# Patient Record
Sex: Male | Born: 1980
Health system: Southern US, Community
[De-identification: ages and names within clinical notes are randomized; demographics above are authoritative.]

## PROBLEM LIST (undated history)

## (undated) DIAGNOSIS — M1990 Unspecified osteoarthritis, unspecified site: Secondary | ICD-10-CM

## (undated) DIAGNOSIS — R079 Chest pain, unspecified: Secondary | ICD-10-CM

## (undated) DIAGNOSIS — E7800 Pure hypercholesterolemia, unspecified: Secondary | ICD-10-CM

## (undated) DIAGNOSIS — F419 Anxiety disorder, unspecified: Secondary | ICD-10-CM

## (undated) DIAGNOSIS — K219 Gastro-esophageal reflux disease without esophagitis: Secondary | ICD-10-CM

## (undated) HISTORY — PX: LEG SURGERY: SHX1003

## (undated) HISTORY — PX: VASECTOMY: SHX75

## (undated) HISTORY — PX: VASECTOMY REVERSAL: SHX243

---

## 2000-02-03 ENCOUNTER — Emergency Department (HOSPITAL_COMMUNITY): Admission: EM | Admit: 2000-02-03 | Discharge: 2000-02-03 | Payer: Self-pay | Admitting: Emergency Medicine

## 2004-02-08 ENCOUNTER — Emergency Department (HOSPITAL_COMMUNITY): Admission: EM | Admit: 2004-02-08 | Discharge: 2004-02-08 | Payer: Self-pay | Admitting: Emergency Medicine

## 2008-09-23 ENCOUNTER — Ambulatory Visit: Payer: Self-pay | Admitting: Gastroenterology

## 2008-09-23 DIAGNOSIS — R079 Chest pain, unspecified: Secondary | ICD-10-CM | POA: Insufficient documentation

## 2008-09-23 DIAGNOSIS — K5900 Constipation, unspecified: Secondary | ICD-10-CM | POA: Insufficient documentation

## 2008-10-03 ENCOUNTER — Ambulatory Visit: Payer: Self-pay | Admitting: Gastroenterology

## 2008-11-04 ENCOUNTER — Telehealth: Payer: Self-pay | Admitting: Gastroenterology

## 2010-05-02 ENCOUNTER — Emergency Department (HOSPITAL_COMMUNITY): Admission: EM | Admit: 2010-05-02 | Discharge: 2010-05-02 | Payer: Self-pay | Admitting: Emergency Medicine

## 2013-02-05 ENCOUNTER — Other Ambulatory Visit: Payer: Self-pay

## 2013-02-05 MED ORDER — ATORVASTATIN CALCIUM 20 MG PO TABS: 20.0000 mg | ORAL_TABLET | Freq: Every day | ORAL | Status: DC

## 2013-02-05 MED ORDER — FENOFIBRATE 160 MG PO TABS: 160.0000 mg | ORAL_TABLET | Freq: Every day | ORAL | Status: DC

## 2013-04-29 ENCOUNTER — Emergency Department (HOSPITAL_BASED_OUTPATIENT_CLINIC_OR_DEPARTMENT_OTHER)
Admission: EM | Admit: 2013-04-29 | Discharge: 2013-04-29 | Disposition: A | Discharge status: Home / Self Care | Payer: 59 | Attending: Emergency Medicine | Admitting: Emergency Medicine

## 2013-04-29 ENCOUNTER — Emergency Department (HOSPITAL_BASED_OUTPATIENT_CLINIC_OR_DEPARTMENT_OTHER): Payer: 59

## 2013-04-29 ENCOUNTER — Encounter (HOSPITAL_BASED_OUTPATIENT_CLINIC_OR_DEPARTMENT_OTHER): Payer: Self-pay | Admitting: *Deleted

## 2013-04-29 DIAGNOSIS — E7800 Pure hypercholesterolemia, unspecified: Secondary | ICD-10-CM | POA: Insufficient documentation

## 2013-04-29 DIAGNOSIS — Z79899 Other long term (current) drug therapy: Secondary | ICD-10-CM | POA: Insufficient documentation

## 2013-04-29 DIAGNOSIS — W07XXXA Fall from chair, initial encounter: Secondary | ICD-10-CM | POA: Insufficient documentation

## 2013-04-29 DIAGNOSIS — S52121A Displaced fracture of head of right radius, initial encounter for closed fracture: Secondary | ICD-10-CM

## 2013-04-29 DIAGNOSIS — F411 Generalized anxiety disorder: Secondary | ICD-10-CM | POA: Insufficient documentation

## 2013-04-29 DIAGNOSIS — S52123A Displaced fracture of head of unspecified radius, initial encounter for closed fracture: Secondary | ICD-10-CM | POA: Insufficient documentation

## 2013-04-29 DIAGNOSIS — Y9389 Activity, other specified: Secondary | ICD-10-CM | POA: Insufficient documentation

## 2013-04-29 DIAGNOSIS — F329 Major depressive disorder, single episode, unspecified: Secondary | ICD-10-CM | POA: Insufficient documentation

## 2013-04-29 DIAGNOSIS — F3289 Other specified depressive episodes: Secondary | ICD-10-CM | POA: Insufficient documentation

## 2013-04-29 DIAGNOSIS — F17200 Nicotine dependence, unspecified, uncomplicated: Secondary | ICD-10-CM | POA: Insufficient documentation

## 2013-04-29 DIAGNOSIS — Y929 Unspecified place or not applicable: Secondary | ICD-10-CM | POA: Insufficient documentation

## 2013-04-29 HISTORY — DX: Anxiety disorder, unspecified: F41.9

## 2013-04-29 HISTORY — DX: Pure hypercholesterolemia, unspecified: E78.00

## 2013-04-29 MED ORDER — HYDROCODONE-ACETAMINOPHEN 5-325 MG PO TABS: 2.0000 | ORAL_TABLET | ORAL | Status: DC | PRN

## 2013-04-29 MED ORDER — HYDROCODONE-ACETAMINOPHEN 5-325 MG PO TABS
2.0000 | ORAL_TABLET | Freq: Once | ORAL | Status: AC
  Administered 2013-04-29: 2 via ORAL
  Filled 2013-04-29: qty 2

## 2013-04-29 NOTE — ED Notes (Signed)
MD at bedside. 

## 2013-04-29 NOTE — ED Provider Notes (Signed)
History    CSN: [id number] Arrival date & time 04/29/13  1806  First MD Initiated Contact with Patient 04/29/13 1856     Chief Complaint  Patient presents with  . Elbow Injury   (Consider location/radiation/quality/duration/timing/severity/associated sxs/prior Treatment) HPI Comments: Patient was working on a smoke detector and fell from a chair, injuring his right elbow.  No other injury.  Occurred one hour ago.  Worse with movement, range of motion.  Better with rest.    Patient is a 32 y.o. male presenting with arm injury. The history is provided by the patient.  Arm Injury Location:  Elbow Time since incident:  1 hour Injury: yes   Mechanism of injury: fall   Fall:    Fall occurred:  From a stool   Impact surface:  Hard floor   Point of impact:  Outstretched arms Elbow location:  R elbow Pain details:    Quality:  Sharp   Radiates to:  Does not radiate   Severity:  Moderate   Onset quality:  Sudden   Timing:  Constant   Progression:  Unchanged Chronicity:  New  Past Medical History  Diagnosis Date  . Hypercholesteremia   . Anxiety   . Depression    History reviewed. No pertinent past surgical history. History reviewed. No pertinent family history. History  Substance Use Topics  . Smoking status: Current Every Day Smoker  . Smokeless tobacco: Not on file  . Alcohol Use: Yes    Review of Systems  All other systems reviewed and are negative.    Allergies  Review of patient's allergies indicates no known allergies.  Home Medications   Current Outpatient Rx  Name  Route  Sig  Dispense  Refill  . ALPRAZolam (XANAX) 0.5 MG tablet   Oral   Take 0.5 mg by mouth at bedtime as needed for sleep.         Marland Kitchen esomeprazole (NEXIUM) 20 MG packet   Oral   Take 20 mg by mouth daily before breakfast.         . sertraline (ZOLOFT) 50 MG tablet   Oral   Take 50 mg by mouth daily.         Marland Kitchen atorvastatin (LIPITOR) 20 MG tablet   Oral   Take 1 tablet (20 mg  total) by mouth daily.   30 tablet   0   . fenofibrate 160 MG tablet   Oral   Take 1 tablet (160 mg total) by mouth daily.   30 tablet   0    BP 133/73  Pulse 74  Temp(Src) 98.6 F (37 C) (Oral)  Resp 20  Ht 5\' 10"  (1.778 m)  Wt 190 lb (86.183 kg)  BMI 27.26 kg/m2  SpO2 100% Physical Exam  Nursing note and vitals reviewed. Constitutional: He is oriented to person, place, and time. He appears well-developed and well-nourished. No distress.  HENT:  Head: Normocephalic and atraumatic.  Neck: Normal range of motion. Neck supple.  Musculoskeletal:  The right elbow is noted to have ttp over the lateral aspect, but otherwise appears grossly normal.  There is pain with range of motion.  The distal pulses, motor, and sensation are intact as well.  Neurological: He is alert and oriented to person, place, and time.  Skin: Skin is warm and dry. He is not diaphoretic.    ED Course  Procedures (including critical care time) Labs Reviewed - No data to display No results found. No diagnosis found.  MDM  Xrays show a radial head fracture.  Will treat with a long arm splint, sling, follow up with orthopedics.    Geoffery Lyons, MD 04/29/13 [phone]

## 2013-04-29 NOTE — ED Notes (Signed)
Pt states he fell out of a chair and landed on his right elbow. PMS intact. Placed in sling for comfort and ice applied.

## 2013-05-01 ENCOUNTER — Other Ambulatory Visit: Payer: Self-pay | Admitting: Orthopedic Surgery

## 2013-05-01 DIAGNOSIS — S52121A Displaced fracture of head of right radius, initial encounter for closed fracture: Secondary | ICD-10-CM

## 2013-05-03 ENCOUNTER — Ambulatory Visit
Admission: RE | Admit: 2013-05-03 | Discharge: 2013-05-03 | Disposition: A | Discharge status: Home / Self Care | Payer: 59 | Source: Ambulatory Visit | Attending: Orthopedic Surgery | Admitting: Orthopedic Surgery

## 2013-05-03 DIAGNOSIS — S52121A Displaced fracture of head of right radius, initial encounter for closed fracture: Secondary | ICD-10-CM

## 2013-05-09 HISTORY — PX: ELBOW FRACTURE SURGERY: SHX616

## 2013-08-08 ENCOUNTER — Ambulatory Visit (INDEPENDENT_AMBULATORY_CARE_PROVIDER_SITE_OTHER): Payer: 59 | Admitting: Physician Assistant

## 2013-08-08 ENCOUNTER — Encounter: Payer: Self-pay | Admitting: Physician Assistant

## 2013-08-08 VITALS — BP 118/80 | HR 72 | Temp 98.2°F | Resp 16 | Ht 69.0 in | Wt 195.0 lb

## 2013-08-08 DIAGNOSIS — K219 Gastro-esophageal reflux disease without esophagitis: Secondary | ICD-10-CM

## 2013-08-08 DIAGNOSIS — Z0000 Encounter for general adult medical examination without abnormal findings: Secondary | ICD-10-CM

## 2013-08-08 DIAGNOSIS — E785 Hyperlipidemia, unspecified: Secondary | ICD-10-CM | POA: Insufficient documentation

## 2013-08-08 DIAGNOSIS — F329 Major depressive disorder, single episode, unspecified: Secondary | ICD-10-CM | POA: Insufficient documentation

## 2013-08-08 DIAGNOSIS — F341 Dysthymic disorder: Secondary | ICD-10-CM

## 2013-08-08 DIAGNOSIS — F17200 Nicotine dependence, unspecified, uncomplicated: Secondary | ICD-10-CM

## 2013-08-08 DIAGNOSIS — F32A Depression, unspecified: Secondary | ICD-10-CM

## 2013-08-08 DIAGNOSIS — Z559 Problems related to education and literacy, unspecified: Secondary | ICD-10-CM

## 2013-08-08 LAB — HEMOGLOBIN A1C
Hgb A1c MFr Bld: 5.4 % (ref <5.7)
Mean Plasma Glucose: 108 mg/dL (ref <117)

## 2013-08-08 LAB — CBC WITH DIFFERENTIAL/PLATELET
Basophils Absolute: 0 10*3/uL (ref 0.0–0.1)
Eosinophils Absolute: 0.3 10*3/uL (ref 0.0–0.7)
Eosinophils Relative: 5 % (ref 0–5)
HCT: 43.1 % (ref 39.0–52.0)
Lymphocytes Relative: 29 % (ref 12–46)
MCH: 29.7 pg (ref 26.0–34.0)
Monocytes Absolute: 0.5 10*3/uL (ref 0.1–1.0)
Neutrophils Relative %: 57 % (ref 43–77)
RBC: 5.11 MIL/uL (ref 4.22–5.81)
RDW: 13.4 % (ref 11.5–15.5)
WBC: 5.5 10*3/uL (ref 4.0–10.5)

## 2013-08-08 LAB — LIPID PANEL
Cholesterol: 179 mg/dL (ref 0–200)
HDL: 36 mg/dL — ABNORMAL LOW (ref >39)
Total CHOL/HDL Ratio: 5 Ratio
VLDL: 71 mg/dL — ABNORMAL HIGH (ref 0–40)

## 2013-08-08 LAB — HEPATIC FUNCTION PANEL
AST: 16 U/L (ref 0–37)
Alkaline Phosphatase: 49 U/L (ref 39–117)

## 2013-08-08 LAB — BASIC METABOLIC PANEL
BUN: 15 mg/dL (ref 6–23)
CO2: 31 mEq/L (ref 19–32)
Creat: 1.15 mg/dL (ref 0.50–1.35)
Potassium: 4.4 mEq/L (ref 3.5–5.3)
Sodium: 139 mEq/L (ref 135–145)

## 2013-08-08 LAB — TSH: TSH: 0.61 u[IU]/mL (ref 0.350–4.500)

## 2013-08-08 MED ORDER — ESOMEPRAZOLE MAGNESIUM 20 MG PO PACK: 20.0000 mg | PACK | Freq: Every day | ORAL | Status: DC

## 2013-08-08 MED ORDER — ALPRAZOLAM 0.5 MG PO TABS: 0.5000 mg | ORAL_TABLET | Freq: Every evening | ORAL | Status: DC | PRN

## 2013-08-08 MED ORDER — ATORVASTATIN CALCIUM 20 MG PO TABS: 20.0000 mg | ORAL_TABLET | Freq: Every day | ORAL | Status: DC

## 2013-08-08 MED ORDER — FENOFIBRATE 160 MG PO TABS: 160.0000 mg | ORAL_TABLET | Freq: Every day | ORAL | Status: DC

## 2013-08-08 MED ORDER — SERTRALINE HCL 100 MG PO TABS: 100.0000 mg | ORAL_TABLET | Freq: Every day | ORAL | Status: DC

## 2013-08-08 NOTE — Assessment & Plan Note (Signed)
Refill lipitor and fenofibrate.  Will obtain fasting lipid panel.

## 2013-08-08 NOTE — Progress Notes (Signed)
Patient ID: Jerry Lopez, male   DOB: 07/29/1981, 32 y.o.   MRN: [id number]  Patient presents to clinic today to establish care.  Acute Concerns: Smoking Cessation -- Patient with history of smoking.  Patient endorses quitting in the past cold [place] which worked for him.  Had quit up until a few months ago, when he lost his job.  Patient states he feels that if he can get his depression under control he will be able to quit.  Wants to discuss options for smoking cessation today.  Chronic Issues: Depression -- Chronic.  Patient endorses increase in symptoms after he lost job due to being out of work for elbow surgery too long.  Endorses anhedonia and depressed mood. Has been on Zoloft 50 mg for several years, which he states has been very effective. Denies SIs/HIs.  Has seen therapist 14 years ago.  Has noticed increase in smoking and drinking.  No history of thyroid disorder.  Hypercholesterolemia -- Patient with reported history of hypercholesterolemia.  Is currently on atorvastatin and fenofibrate.  Requesting refills.  GERD -- Patient with history of GERD requiring Nexium 20 mg daily.  Has had EGD in the past; denies abnormal findings.  Symptoms are well controlled with medication.  Endorses rare breakthrough symptoms with alcohol ingestion.  Health Maintenance: Dental -- 1 year.  Overdue. Vision -- overdue. Immunizations -- Up-to-date on immunizations.  Flu shot declined.    Past Medical History  Diagnosis Date  . Hypercholesteremia   . Anxiety   . Depression     No current outpatient prescriptions on file prior to visit.   No current facility-administered medications on file prior to visit.    No Known Allergies  Family History  Problem Relation Age of Onset  . Hypertension Father   . Non-Hodgkin's lymphoma Father   . Diabetes Father     History   Social History  . Marital Status: Married    Spouse Name: N/A    Number of Children: N/A  . Years of Education:  N/A   Social History Main Topics  . Smoking status: Current Every Day Smoker -- 1.00 packs/day for 5 years  . Smokeless tobacco: Never Used  . Alcohol Use: 3.6 oz/week    6 Cans of beer per week  . Drug Use: No  . Sexual Activity: Yes    Birth Control/ Protection: None   Other Topics Concern  . None   Social History Narrative  . None   Review of Systems  Constitutional: Negative for fever, chills and weight loss.  HENT: Negative for ear pain, hearing loss and tinnitus.   Eyes: Negative for blurred vision, double vision, photophobia and pain.  Respiratory: Negative for cough, shortness of breath and wheezing.   Cardiovascular: Negative for chest pain and palpitations.  Gastrointestinal: Positive for heartburn. Negative for nausea, vomiting, abdominal pain, diarrhea, constipation, blood in stool and melena.  Genitourinary: Negative for dysuria, urgency, frequency, hematuria and flank pain.  Musculoskeletal: Positive for joint pain. Negative for myalgias.  Neurological: Negative for dizziness, seizures, loss of consciousness and headaches.  Endo/Heme/Allergies: Positive for environmental allergies.  Psychiatric/Behavioral: Positive for depression. Negative for suicidal ideas, hallucinations and substance abuse. The patient is nervous/anxious and has insomnia.    Filed Vitals:   08/08/13 1035  BP: 118/80  Pulse: 72  Temp: 98.2 F (36.8 C)  Resp: 16   Physical Exam  Vitals reviewed. Constitutional: He is oriented to person, place, and time and well-developed, well-nourished, and in no  distress.  HENT:  Head: Normocephalic and atraumatic.  Right Ear: External ear normal.  Left Ear: External ear normal.  Nose: Nose normal.  Mouth/Throat: Oropharynx is clear and moist. No oropharyngeal exudate.  TM WNL bilaterally  Eyes: Conjunctivae and EOM are normal. Pupils are equal, round, and reactive to light.  Neck: Neck supple. No thyromegaly present.  Cardiovascular: Normal rate,  regular rhythm, normal heart sounds and intact distal pulses.   Pulmonary/Chest: Effort normal and breath sounds normal. No respiratory distress. He has no wheezes.  Abdominal: Soft. Bowel sounds are normal. He exhibits no distension and no mass. There is no tenderness. There is no rebound and no guarding.  Lymphadenopathy:    He has no cervical adenopathy.  Neurological: He is alert and oriented to person, place, and time. He has normal reflexes. No cranial nerve deficit.  Skin: Skin is warm and dry. No rash noted.  Psychiatric: Affect normal.   Assessment/Plan: GERD (gastroesophageal reflux disease) Well-controlled.  Refill nexium 20 mg QD.  Encourage alcohol cessation.  Materials and suggestion given to patient for smoking cessation.  Encounter for preventive health examination Will obtain fasting labs.  Declines flu shot.  Anxiety and depression Increase Zoloft to 75 mg QD x 7 days, then increase to 100 mg QD.  Refill Xanax 0.5mg  prn at bedtime to help with sleep/anxiety.  Follow-up in 1 month.  Other and unspecified hyperlipidemia Refill lipitor and fenofibrate.  Will obtain fasting lipid panel.  Needs smoking cessation education Materials given.  Patient wishes to start with Nicorette gum.  Will see patient in 1 month for follow-up of anxiety/depression.  Will revisit smoking cessation efforts at that time.

## 2013-08-08 NOTE — Patient Instructions (Signed)
Please take 1 1/2 (50mg ) pills of zoloft for the next 5-7 days.  That's a total of 75 mg per day.  Then take 1 100 mg tablet a day thereafter.  Xanax at bedtime to help with sleep.  Follow-up in 1 month.  Please take all other medications as prescribed.  Please read information below on smoking cessation tips.  Please obtain labs.  I will call you with your results.  Smoking Cessation, Tips for Success YOU CAN QUIT SMOKING If you are ready to quit smoking, congratulations! You have chosen to help yourself be healthier. Cigarettes bring nicotine, tar, carbon monoxide, and other irritants into your body. Your lungs, heart, and blood vessels will be able to work better without these poisons. There are many different ways to quit smoking. Nicotine gum, nicotine patches, a nicotine inhaler, or nicotine nasal spray can help with physical craving. Hypnosis, support groups, and medicines help break the habit of smoking. Here are some tips to help you quit for good.  Throw away all cigarettes.  Clean and remove all ashtrays from your home, work, and car.  On a card, write down your reasons for quitting. Carry the card with you and read it when you get the urge to smoke.  Cleanse your body of nicotine. Drink enough water and fluids to keep your urine clear or pale yellow. Do this after quitting to flush the nicotine from your body.  Learn to predict your moods. Do not let a bad situation be your excuse to have a cigarette. Some situations in your life might tempt you into wanting a cigarette.  Never have "just one" cigarette. It leads to wanting another and another. Remind yourself of your decision to quit.  Change habits associated with smoking. If you smoked while driving or when feeling stressed, try other activities to replace smoking. Stand up when drinking your coffee. Brush your teeth after eating. Sit in a different chair when you read the paper. Avoid alcohol while trying to quit, and try to drink  fewer caffeinated beverages. Alcohol and caffeine may urge you to smoke.  Avoid foods and drinks that can trigger a desire to smoke, such as sugary or spicy foods and alcohol.  Ask people who smoke not to smoke around you.  Have something planned to do right after eating or having a cup of coffee. Take a walk or exercise to perk you up. This will help to keep you from overeating.  Try a relaxation exercise to calm you down and decrease your stress. Remember, you may be tense and nervous for the first 2 weeks after you quit, but this will pass.  Find new activities to keep your hands busy. Play with a pen, coin, or rubber band. Doodle or draw things on paper.  Brush your teeth right after eating. This will help cut down on the craving for the taste of tobacco after meals. You can try mouthwash, too.  Use oral substitutes, such as lemon drops, carrots, a cinnamon stick, or chewing gum, in place of cigarettes. Keep them handy so they are available when you have the urge to smoke.  When you have the urge to smoke, try deep breathing.  Designate your home as a nonsmoking area.  If you are a heavy smoker, ask your caregiver about a prescription for nicotine chewing gum. It can ease your withdrawal from nicotine.  Reward yourself. Set aside the cigarette money you save and buy yourself something nice.  Look for support from others.  Join a support group or smoking cessation program. Ask someone at home or at work to help you with your plan to quit smoking.  Always ask yourself, "Do I need this cigarette or is this just a reflex?" Tell yourself, "Today, I choose not to smoke," or "I do not want to smoke." You are reminding yourself of your decision to quit, even if you do smoke a cigarette. HOW WILL I FEEL WHEN I QUIT SMOKING?  The benefits of not smoking start within days of quitting.  You may have symptoms of withdrawal because your body is used to nicotine (the addictive substance in  cigarettes). You may crave cigarettes, be irritable, feel very hungry, cough often, get headaches, or have difficulty concentrating.  The withdrawal symptoms are only temporary. They are strongest when you first quit but will go away within 10 to 14 days.  When withdrawal symptoms occur, stay in control. Think about your reasons for quitting. Remind yourself that these are signs that your body is healing and getting used to being without cigarettes.  Remember that withdrawal symptoms are easier to treat than the major diseases that smoking can cause.  Even after the withdrawal is over, expect periodic urges to smoke. However, these cravings are generally short-lived and will go away whether you smoke or not. Do not smoke!  If you relapse and smoke again, do not lose hope. Most smokers quit 3 times before they are successful.  If you relapse, do not give up! Plan ahead and think about what you will do the next time you get the urge to smoke. LIFE AS A NONSMOKER: MAKE IT FOR A MONTH, MAKE IT FOR LIFE Day 1: Hang this page where you will see it every day. Day 2: Get rid of all ashtrays, matches, and lighters. Day 3: Drink water. Breathe deeply between sips. Day 4: Avoid places with smoke-filled air, such as bars, clubs, or the smoking section of restaurants. Day 5: Keep track of how much money you save by not smoking. Day 6: Avoid boredom. Keep a good book with you or go to the movies. Day 7: Reward yourself! One week without smoking! Day 8: Make a dental appointment to get your teeth cleaned. Day 9: Decide how you will turn down a cigarette before it is offered to you. Day 10: Review your reasons for quitting. Day 11: Distract yourself. Stay active to keep your mind off smoking and to relieve tension. Take a walk, exercise, read a book, do a crossword puzzle, or try a new hobby. Day 12: Exercise. Get off the bus before your stop or use stairs instead of escalators. Day 13: Call on friends for  support and encouragement. Day 14: Reward yourself! Two weeks without smoking! Day 15: Practice deep breathing exercises. Day 16: Bet a friend that you can stay a nonsmoker. Day 17: Ask to sit in nonsmoking sections of restaurants. Day 18: Hang up "No Smoking" signs. Day 19: Think of yourself as a nonsmoker. Day 20: Each morning, tell yourself you will not smoke. Day 21: Reward yourself! Three weeks without smoking! Day 22: Think of smoking in negative ways. Remember how it stains your teeth, gives you bad breath, and leaves you short of breath. Day 23: Eat a nutritious breakfast. Day 24:Do not relive your days as a smoker. Day 25: Hold a pencil in your hand when talking on the telephone. Day 26: Tell all your friends you do not smoke. Day 27: Think about how much better food  tastes. Day 28: Remember, one cigarette is one too many. Day 29: Take up a hobby that will keep your hands busy. Day 30: Congratulations! One month without smoking! Give yourself a big reward. Your caregiver can direct you to community resources or hospitals for support, which may include:  Group support.  Education.  Hypnosis.  Subliminal therapy. Document Released: 07/09/2004 Document Revised: 01/03/2012 Document Reviewed: 07/28/2009 [facility] Patient Information 2014 [place], [place].

## 2013-08-08 NOTE — Assessment & Plan Note (Signed)
Well-controlled.  Refill nexium 20 mg QD.  Encourage alcohol cessation.  Materials and suggestion given to patient for smoking cessation.

## 2013-08-08 NOTE — Assessment & Plan Note (Signed)
Increase Zoloft to 75 mg QD x 7 days, then increase to 100 mg QD.  Refill Xanax 0.5mg  prn at bedtime to help with sleep/anxiety.  Follow-up in 1 month.

## 2013-08-08 NOTE — Assessment & Plan Note (Signed)
Will obtain fasting labs.  Declines flu shot.

## 2013-08-08 NOTE — Assessment & Plan Note (Signed)
Materials given.  Patient wishes to start with Nicorette gum.  Will see patient in 1 month for follow-up of anxiety/depression.  Will revisit smoking cessation efforts at that time.

## 2013-08-09 ENCOUNTER — Telehealth: Payer: Self-pay | Admitting: Physician Assistant

## 2013-08-09 DIAGNOSIS — E782 Mixed hyperlipidemia: Secondary | ICD-10-CM

## 2013-08-09 LAB — URINALYSIS, ROUTINE W REFLEX MICROSCOPIC
Hgb urine dipstick: NEGATIVE
Protein, ur: NEGATIVE mg/dL
pH: 8 (ref 5.0–8.0)

## 2013-08-09 MED ORDER — FENOFIBRATE MICRONIZED 67 MG PO CAPS: 67.0000 mg | ORAL_CAPSULE | Freq: Every day | ORAL | Status: DC

## 2013-08-09 NOTE — Telephone Encounter (Signed)
Patient informed, understood & agreed; R/S f/u appt to Mon, 11.17.14 at 8:15a for fasting labs/SLS

## 2013-08-09 NOTE — Addendum Note (Signed)
Addended by: Marcelline Mates on: 08/09/2013 03:32 PM   Modules accepted: Medications

## 2013-08-09 NOTE — Telephone Encounter (Addendum)
Please inform patient that overall his labs look good.  Patient to continue with lipitor and fenofibrate.  He should also decrease intake of saturated fats, transaturated fats, and refined sugars, as well as increase amount of exercise per week. Patient is to return in 1 month for follow-up appointment and blood work.

## 2013-08-22 ENCOUNTER — Other Ambulatory Visit: Payer: Self-pay | Admitting: Family Medicine

## 2013-09-03 ENCOUNTER — Ambulatory Visit: Payer: 59 | Admitting: Physician Assistant

## 2013-09-10 ENCOUNTER — Ambulatory Visit: Payer: 59 | Admitting: Physician Assistant

## 2013-09-12 ENCOUNTER — Ambulatory Visit: Payer: 59 | Admitting: Physician Assistant

## 2013-09-14 ENCOUNTER — Encounter: Payer: Self-pay | Admitting: Physician Assistant

## 2013-09-14 ENCOUNTER — Ambulatory Visit (INDEPENDENT_AMBULATORY_CARE_PROVIDER_SITE_OTHER): Payer: 59 | Admitting: Physician Assistant

## 2013-09-14 VITALS — BP 108/84 | HR 53 | Temp 97.9°F | Resp 16 | Ht 69.0 in | Wt 190.8 lb

## 2013-09-14 DIAGNOSIS — F32A Depression, unspecified: Secondary | ICD-10-CM

## 2013-09-14 DIAGNOSIS — F329 Major depressive disorder, single episode, unspecified: Secondary | ICD-10-CM

## 2013-09-14 DIAGNOSIS — F341 Dysthymic disorder: Secondary | ICD-10-CM

## 2013-09-14 DIAGNOSIS — E785 Hyperlipidemia, unspecified: Secondary | ICD-10-CM

## 2013-09-14 LAB — LIPID PANEL
HDL: 35 mg/dL — ABNORMAL LOW (ref >39)
LDL Cholesterol: 82 mg/dL (ref 0–99)
Total CHOL/HDL Ratio: 4.3 Ratio
Triglycerides: 161 mg/dL — ABNORMAL HIGH (ref <150)

## 2013-09-14 MED ORDER — ALPRAZOLAM 0.5 MG PO TABS: ORAL_TABLET | ORAL | Status: DC

## 2013-09-14 MED ORDER — ATORVASTATIN CALCIUM 20 MG PO TABS: 20.0000 mg | ORAL_TABLET | Freq: Every day | ORAL | Status: DC

## 2013-09-14 MED ORDER — FENOFIBRATE 160 MG PO TABS: 160.0000 mg | ORAL_TABLET | Freq: Every day | ORAL | Status: DC

## 2013-09-14 NOTE — Patient Instructions (Signed)
I will call you with your results.  Please read information below on melatonin.  Begin taking at bedtime for sleep aid.  Xanax is to be used only if needed for severe anxiety.  I would like to see you in 1-2 months to reassess sleep habits.  Melatonin oral capsules and tablets What is this medicine? MELATONIN (mel uh TOH nin) is a dietary supplement. It is promoted to help maintain normal sleep patterns. The FDA has not approved this supplement for any medical use. This supplement may be used for other purposes; ask your health care provider or pharmacist if you have questions. This medicine may be used for other purposes; ask your health care provider or pharmacist if you have questions. COMMON BRAND NAME(S): Melatonex What should I tell my health care provider before I take this medicine? They need to know if you have any of these conditions: -cancer -if you frequently drink alcohol containing drinks -immune system problems -liver disease -seizure disorder -an unusual or allergic reaction to melatonin, other medicines, foods, dyes, or preservatives -pregnant or trying to get pregnant -breast-feeding How should I use this medicine? Take this supplement by mouth with a glass of water. This supplement is usually taken prior to bedtime. Do not chew or crush most tablets or capsules. Some tablets are chewable and are chewed before swallowing. Some tablets are meant to be dissolved in the mouth or under the tongue. Follow the directions on the package labeling, or take as directed by your health care professional. Do not take this supplement more often than directed. Talk to your pediatrician regarding the use of this supplement in children. This supplement is not recommended for use in children. Overdosage: If you think you have taken too much of this medicine contact a poison control center or emergency room at once. NOTE: This medicine is only for you. Do not share this medicine with  others. What if I miss a dose? This does not apply; this medicine is not for regular use. Do not take double or extra doses. What may interact with this medicine? Check with your doctor or healthcare professional if you are taking any of the following medications: -hormone medicines -medicines for blood pressure like nifedipine -medications for anxiety, depression, or other emotional or psychiatric problems -medications for seizures -medications for sleep -other herbal or dietary supplements -stimulant medicines like amphetamine, dextroamphetamine -tamoxifen -treatments for cancer or immune disorders This list may not describe all possible interactions. Give your health care provider a list of all the medicines, herbs, non-prescription drugs, or dietary supplements you use. Also tell them if you smoke, drink alcohol, or use illegal drugs. Some items may interact with your medicine. What should I watch for while using this medicine? See your doctor if your symptoms do not get better or if they get worse. Do not take this supplement for more than 2 weeks unless your doctor tells you to. You may get drowsy or dizzy. Do not drive, use machinery, or do anything that needs mental alertness until you know how this medicine affects you. Do not stand or sit up quickly, especially if you are an older patient. This reduces the risk of dizzy or fainting spells. Alcohol may interfere with the effect of this medicine. Avoid alcoholic drinks. Talk to your doctor before you use this supplement if you are currently being treated for an emotional, mental, or sleep problem. This medicine may interfere with your treatment. Herbal or dietary supplements are not regulated like medicines. Rigid  quality control standards are not required for dietary supplements. The purity and strength of these products can vary. The safety and effect of this dietary supplement for a certain disease or illness is not well known. This  product is not intended to diagnose, treat, cure or prevent any disease. The Food and Drug Administration suggests the following to help consumers protect themselves: -Always read product labels and follow directions. -Natural does not mean a product is safe for humans to take. -Look for products that include USP after the ingredient name. This means that the manufacturer followed the standards of the [place] Pharmacopoeia. -Supplements made or sold by a nationally known food or drug company are more likely to be made under tight controls. You can write to the company for more information about how the product was made. What side effects may I notice from receiving this medicine? Side effects that you should report to your doctor or health care professional as soon as possible: -allergic reactions like skin rash, itching or hives, swelling of the face, lips, or tongue -breathing problems -confusion, forgetful -depressed, nervous, or other mood changes -fast or pounding heartbeat -trouble staying awake or alert during the day Side effects that usually do not require medical attention (report to your doctor or health care professional if they continue or are bothersome): -drowsiness, dizziness -headache -nightmares -upset stomach This list may not describe all possible side effects. Call your doctor for medical advice about side effects. You may report side effects to FDA at 1-800-FDA-1088. Where should I keep my medicine? Keep out of the reach of children. Store at room temperature or as directed on the package label. Protect from moisture. Throw away any unused supplement after the expiration date. NOTE: This sheet is a summary. It may not cover all possible information. If you have questions about this medicine, talk to your doctor, pharmacist, or health care provider.  2014, [employer]/Gold Standard. (2013-02-19 10:51:55)

## 2013-09-14 NOTE — Progress Notes (Signed)
Patient ID: Jerry Lopez, male   DOB: 09/17/1981, 32 y.o.   MRN: [id number]  Patient presents to clinic today for follow up of hyperlipidemia and insomnia.   Patient states he has continued taking his fenofibrate and statin as prescribed. Has run out of medication and needs refills.  Patient is fasting for repeat lipid profile.  At last visit patient expressed concerns over difficulty falling asleep.  Is taking 50 mg of zoloft which we increased to 100 mg daily due to worsening symptoms.  Patient states he did not increase to 100 but has stayed on 50 mg and is feeling fine.  Has been taking xanax as needed for sleep but states it makes him feel too groggy. Is wondering if there is another possible medication to help with sleep.   Past Medical History  Diagnosis Date  . Hypercholesteremia   . Anxiety   . Depression     Current Outpatient Prescriptions on File Prior to Visit  Medication Sig Dispense Refill  . esomeprazole (NEXIUM) 20 MG packet Take 20 mg by mouth daily before breakfast.  30 each  3  . HYDROcodone-acetaminophen (NORCO) 10-325 MG per tablet Take 1-2 tablets by mouth every 6 (six) hours as needed for pain.      Marland Kitchen sertraline (ZOLOFT) 100 MG tablet Take 1 tablet (100 mg total) by mouth daily.  30 tablet  2   No current facility-administered medications on file prior to visit.    No Known Allergies  Family History  Problem Relation Age of Onset  . Hypertension Father   . Non-Hodgkin's lymphoma Father   . Diabetes Father     History   Social History  . Marital Status: Married    Spouse Name: N/A    Number of Children: N/A  . Years of Education: N/A   Social History Main Topics  . Smoking status: Current Every Day Smoker -- 1.00 packs/day for 5 years  . Smokeless tobacco: Never Used  . Alcohol Use: 3.6 oz/week    6 Cans of beer per week  . Drug Use: No  . Sexual Activity: Yes    Birth Control/ Protection: None   Other Topics Concern  . None   Social  History Narrative  . None   ROS See HPI.  All other ROS are negative.   Filed Vitals:   09/14/13 0757  BP: 108/84  Pulse: 53  Temp: 97.9 F (36.6 C)  Resp: 16   Physical Exam  Vitals reviewed. Constitutional: He is oriented to person, place, and time and well-developed, well-nourished, and in no distress.  HENT:  Head: Normocephalic and atraumatic.  Eyes: Conjunctivae are normal.  Neck: Neck supple.  Cardiovascular: Normal rate, normal heart sounds and intact distal pulses.   Pulmonary/Chest: Effort normal and breath sounds normal. No respiratory distress. He has no wheezes. He has no rales. He exhibits no tenderness.  Neurological: He is alert and oriented to person, place, and time. No cranial nerve deficit.  Skin: Skin is warm and dry. No rash noted.  Psychiatric: Affect normal.     Recent Results (from the past 2160 hour(s))  CBC WITH DIFFERENTIAL     Status: None   Collection Time    08/08/13 11:25 AM      Result Value Range   WBC 5.5  4.0 - 10.5 K/uL   RBC 5.11  4.22 - 5.81 MIL/uL   Hemoglobin 15.2  13.0 - 17.0 g/dL   HCT 78.4  69.6 - 29.5 %  MCV 84.3  78.0 - 100.0 fL   MCH 29.7  26.0 - 34.0 pg   MCHC 35.3  30.0 - 36.0 g/dL   RDW 16.1  09.6 - 04.5 %   Platelets 198  150 - 400 K/uL   Neutrophils Relative % 57  43 - 77 %   Neutro Abs 3.2  1.7 - 7.7 K/uL   Lymphocytes Relative 29  12 - 46 %   Lymphs Abs 1.6  0.7 - 4.0 K/uL   Monocytes Relative 9  3 - 12 %   Monocytes Absolute 0.5  0.1 - 1.0 K/uL   Eosinophils Relative 5  0 - 5 %   Eosinophils Absolute 0.3  0.0 - 0.7 K/uL   Basophils Relative 0  0 - 1 %   Basophils Absolute 0.0  0.0 - 0.1 K/uL   Smear Review Criteria for review not met    BASIC METABOLIC PANEL     Status: None   Collection Time    08/08/13 11:25 AM      Result Value Range   Sodium 139  135 - 145 mEq/L   Potassium 4.4  3.5 - 5.3 mEq/L   Chloride 101  96 - 112 mEq/L   CO2 31  19 - 32 mEq/L   Glucose, Bld 91  70 - 99 mg/dL   BUN 15  6 -  23 mg/dL   Creat 4.09  8.11 - 9.14 mg/dL   Calcium 78.2  8.4 - 95.6 mg/dL  HEPATIC FUNCTION PANEL     Status: None   Collection Time    08/08/13 11:25 AM      Result Value Range   Total Bilirubin 0.5  0.3 - 1.2 mg/dL   Bilirubin, Direct 0.1  0.0 - 0.3 mg/dL   Indirect Bilirubin 0.4  0.0 - 0.9 mg/dL   Alkaline Phosphatase 49  39 - 117 U/L   AST 16  0 - 37 U/L   ALT 21  0 - 53 U/L   Total Protein 7.2  6.0 - 8.3 g/dL   Albumin 4.6  3.5 - 5.2 g/dL  TSH     Status: None   Collection Time    08/08/13 11:25 AM      Result Value Range   TSH 0.610  0.350 - 4.500 uIU/mL  HEMOGLOBIN A1C     Status: None   Collection Time    08/08/13 11:25 AM      Result Value Range   Hemoglobin A1C 5.4  <5.7 %   Comment:                                                                            According to the ADA Clinical Practice Recommendations for 2011, when     HbA1c is used as a screening test:             >=6.5%   Diagnostic of Diabetes Mellitus                (if abnormal result is confirmed)           5.7-6.4%   Increased risk of developing Diabetes Mellitus           References:Diagnosis  and Classification of Diabetes Mellitus,Diabetes     Care,2011,34(Suppl 1):S62-S69 and Standards of Medical Care in             Diabetes - 2011,Diabetes Care,2011,34 (Suppl 1):S11-S61.         Mean Plasma Glucose 108  <117 mg/dL  LIPID PANEL     Status: Abnormal   Collection Time    08/08/13 11:25 AM      Result Value Range   Cholesterol 179  0 - 200 mg/dL   Comment: ATP III Classification:           < 200        mg/dL        Desirable          200 - 239     mg/dL        Borderline High          >= 240        mg/dL        High         Triglycerides 356 (*) <150 mg/dL   HDL 36 (*) >40 mg/dL   Total CHOL/HDL Ratio 5.0     VLDL 71 (*) 0 - 40 mg/dL   LDL Cholesterol 72  0 - 99 mg/dL   Comment:       Total Cholesterol/HDL Ratio:CHD Risk                            Coronary Heart Disease Risk Table                                             Men       Women              1/2 Average Risk              3.4        3.3                  Average Risk              5.0        4.4               2X Average Risk              9.6        7.1               3X Average Risk             23.4       11.0     Use the calculated Patient Ratio above and the CHD Risk table      to determine the patient's CHD Risk.     ATP III Classification (LDL):           < 100        mg/dL         Optimal          100 - 129     mg/dL         Near or Above Optimal          130 - 159     mg/dL         Borderline High  160 - 189     mg/dL         High           > 190        mg/dL         Very High        URINALYSIS, ROUTINE W REFLEX MICROSCOPIC     Status: None   Collection Time    08/08/13 11:25 AM      Result Value Range   Color, Urine YELLOW  YELLOW   APPearance CLEAR  CLEAR   Specific Gravity, Urine 1.014  1.005 - 1.030   pH 8.0  5.0 - 8.0   Glucose, UA NEG  NEG mg/dL   Bilirubin Urine NEG  NEG   Ketones, ur NEG  NEG mg/dL   Hgb urine dipstick NEG  NEG   Protein, ur NEG  NEG mg/dL   Urobilinogen, UA 1  0.0 - 1.0 mg/dL   Nitrite NEG  NEG   Leukocytes, UA NEG  NEG  LIPID PANEL     Status: Abnormal   Collection Time    09/14/13 12:03 PM      Result Value Range   Cholesterol 149  0 - 200 mg/dL   Comment: ATP III Classification:           < 200        mg/dL        Desirable          200 - 239     mg/dL        Borderline High          >= 240        mg/dL        High         Triglycerides 161 (*) <150 mg/dL   HDL 35 (*) >40 mg/dL   Total CHOL/HDL Ratio 4.3     VLDL 32  0 - 40 mg/dL   LDL Cholesterol 82  0 - 99 mg/dL   Comment:       Total Cholesterol/HDL Ratio:CHD Risk                            Coronary Heart Disease Risk Table                                            Men       Women              1/2 Average Risk              3.4        3.3                  Average Risk              5.0         4.4               2X Average Risk              9.6        7.1               3X Average Risk             23.4       11.0  Use the calculated Patient Ratio above and the CHD Risk table      to determine the patient's CHD Risk.     ATP III Classification (LDL):           < 100        mg/dL         Optimal          100 - 129     mg/dL         Near or Above Optimal          130 - 159     mg/dL         Borderline High          160 - 189     mg/dL         High           > 190        mg/dL         Very High          Assessment/Plan: Anxiety and depression Patient remains on Zoloft 50 mg daily.  Patient states symptoms are well-controlled with this dosage now that he has found a job and anxiety has decreased.  Will continue with current dosage.  Xanax as needed, up to BID for breakthrough anxiety.  Encouraged sleep hygiene and melatonin to aid with sleep.  Other and unspecified hyperlipidemia Will obtain fasting lipid profile.

## 2013-09-14 NOTE — Progress Notes (Signed)
Pre visit review using our clinic review tool, if applicable. No additional management support is needed unless otherwise documented below in the visit note/SLS  

## 2013-09-16 NOTE — Assessment & Plan Note (Signed)
Will obtain fasting lipid profile. 

## 2013-09-16 NOTE — Assessment & Plan Note (Addendum)
Patient remains on Zoloft 50 mg daily.  Patient states symptoms are well-controlled with this dosage now that he has found a job and anxiety has decreased.  Will continue with current dosage.  Xanax as needed, up to BID for breakthrough anxiety.  Encouraged sleep hygiene and melatonin to aid with sleep.

## 2013-11-06 ENCOUNTER — Ambulatory Visit (INDEPENDENT_AMBULATORY_CARE_PROVIDER_SITE_OTHER): Payer: 59 | Admitting: Family

## 2013-11-06 ENCOUNTER — Encounter: Payer: Self-pay | Admitting: Family

## 2013-11-06 VITALS — BP 128/92 | HR 66 | Temp 98.2°F | Resp 16 | Ht 69.0 in | Wt 189.0 lb

## 2013-11-06 DIAGNOSIS — G43909 Migraine, unspecified, not intractable, without status migrainosus: Secondary | ICD-10-CM

## 2013-11-06 MED ORDER — KETOROLAC TROMETHAMINE 30 MG/ML IJ SOLN
60.0000 mg | Freq: Once | INTRAMUSCULAR | Status: AC
  Administered 2013-11-06: 60 mg via INTRAMUSCULAR

## 2013-11-06 NOTE — Progress Notes (Signed)
   Subjective:    Patient ID: Jerry Lopez, male    DOB: 12/15/1980, 34 y.o.   MRN: [id number]  HPI  Jerry Lopez is a 33 yr old male who presents today with chief complaint of headache.  Headache started Thursday and is intermittent.  Reports that the headache was the worst on Friday and then again last night. Rated pain 7-8/10 due to pain. Had some associated light headedness on Friday. He has been using tylenol.  He reports associated photophobia, but denies phonophobia.  He denies migraine.  Pain is located on the top of his head and is associated with tingling.      Review of Systems See HPI  Past Medical History  Diagnosis Date  . Hypercholesteremia   . Anxiety   . Depression     History   Social History  . Marital Status: Married    Spouse Name: N/A    Number of Children: N/A  . Years of Education: N/A   Occupational History  . Not on file.   Social History Main Topics  . Smoking status: Current Every Day Smoker -- 1.00 packs/day for 5 years  . Smokeless tobacco: Never Used  . Alcohol Use: 3.6 oz/week    6 Cans of beer per week  . Drug Use: No  . Sexual Activity: Yes    Birth Control/ Protection: None   Other Topics Concern  . Not on file   Social History Narrative  . No narrative on file    No past surgical history on file.  Family History  Problem Relation Age of Onset  . Hypertension Father   . Non-Hodgkin's lymphoma Father   . Diabetes Father     No Known Allergies  Current Outpatient Prescriptions on File Prior to Visit  Medication Sig Dispense Refill  . ALPRAZolam (XANAX) 0.5 MG tablet Take up to twice daily as needed for breakthrough anxiety  30 tablet  0  . atorvastatin (LIPITOR) 20 MG tablet Take 1 tablet (20 mg total) by mouth daily.  30 tablet  2  . esomeprazole (NEXIUM) 20 MG packet Take 20 mg by mouth daily before breakfast.  30 each  3  . fenofibrate 160 MG tablet Take 1 tablet (160 mg total) by mouth daily.  30 tablet  2    No current facility-administered medications on file prior to visit.    BP 128/92  Pulse 66  Temp(Src) 98.2 F (36.8 C) (Oral)  Resp 16  Ht 5\' 9"  (1.753 m)  Wt 189 lb (85.73 kg)  BMI 27.90 kg/m2  SpO2 99%       Objective:   Physical Exam  Constitutional: He is oriented to person, place, and time. He appears well-developed and well-nourished. No distress.  HENT:  Head: Normocephalic and atraumatic.  Eyes: EOM are normal. Pupils are equal, round, and reactive to light.  Cardiovascular: Normal rate and regular rhythm.   No murmur heard. Pulmonary/Chest: Effort normal and breath sounds normal. No respiratory distress. He has no wheezes. He has no rales. He exhibits no tenderness.  Musculoskeletal: He exhibits no edema.  Neurological: He is alert and oriented to person, place, and time. He exhibits normal muscle tone. Coordination normal.  Skin: Skin is warm and dry.  Psychiatric: He has a normal mood and affect. His behavior is normal. Judgment and thought content normal.          Assessment & Plan:

## 2013-11-06 NOTE — Progress Notes (Signed)
Pre visit review using our clinic review tool, if applicable. No additional management support is needed unless otherwise documented below in the visit note. 

## 2013-11-06 NOTE — Patient Instructions (Signed)
Please call if headache returns after your injection today.  Migraine Headache A migraine headache is an intense, throbbing pain on one or both sides of your head. A migraine can last for 30 minutes to several hours. CAUSES  The exact cause of a migraine headache is not always known. However, a migraine may be caused when nerves in the brain become irritated and release chemicals that cause inflammation. This causes pain. Certain things may also trigger migraines, such as:  Alcohol.  Smoking.  Stress.  Menstruation.  Aged cheeses.  Foods or drinks that contain nitrates, glutamate, aspartame, or tyramine.  Lack of sleep.  Chocolate.  Caffeine.  Hunger.  Physical exertion.  Fatigue.  Medicines used to treat chest pain (nitroglycerine), birth control pills, estrogen, and some blood pressure medicines. SIGNS AND SYMPTOMS  Pain on one or both sides of your head.  Pulsating or throbbing pain.  Severe pain that prevents daily activities.  Pain that is aggravated by any physical activity.  Nausea, vomiting, or both.  Dizziness.  Pain with exposure to bright lights, loud noises, or activity.  General sensitivity to bright lights, loud noises, or smells. Before you get a migraine, you may get warning signs that a migraine is coming (aura). An aura may include:  Seeing flashing lights.  Seeing bright spots, halos, or zig-zag lines.  Having tunnel vision or blurred vision.  Having feelings of numbness or tingling.  Having trouble talking.  Having muscle weakness. DIAGNOSIS  A migraine headache is often diagnosed based on:  Symptoms.  Physical exam.  A CT scan or MRI of your head. These imaging tests cannot diagnose migraines, but they can help rule out other causes of headaches. TREATMENT Medicines may be given for pain and nausea. Medicines can also be given to help prevent recurrent migraines.  HOME CARE INSTRUCTIONS  Only take over-the-counter or  prescription medicines for pain or discomfort as directed by your health care provider. The use of long-term narcotics is not recommended.  Lie down in a dark, quiet room when you have a migraine.  Keep a journal to find out what may trigger your migraine headaches. For example, write down:  What you eat and drink.  How much sleep you get.  Any change to your diet or medicines.  Limit alcohol consumption.  Quit smoking if you smoke.  Get 7 9 hours of sleep, or as recommended by your health care provider.  Limit stress.  Keep lights dim if bright lights bother you and make your migraines worse. SEEK IMMEDIATE MEDICAL CARE IF:   Your migraine becomes severe.  You have a fever.  You have a stiff neck.  You have vision loss.  You have muscular weakness or loss of muscle control.  You start losing your balance or have trouble walking.  You feel faint or pass out.  You have severe symptoms that are different from your first symptoms. MAKE SURE YOU:   Understand these instructions.  Will watch your condition.  Will get help right away if you are not doing well or get worse. Document Released: 10/11/2005 Document Revised: 08/01/2013 Document Reviewed: 06/18/2013 [facility] Patient Information 2014 [place].

## 2013-11-06 NOTE — Assessment & Plan Note (Signed)
IM toradol given today. If no improvement, consider prn imitrex, however he is on SSRI and there is risk of drug interaction, so I would prefer to avoid this if possible. I have advised him to contact me tomorrow if symptoms are not improved.

## 2013-11-11 ENCOUNTER — Other Ambulatory Visit: Payer: Self-pay | Admitting: Physician Assistant

## 2013-11-14 ENCOUNTER — Encounter: Payer: Self-pay | Admitting: Physician Assistant

## 2013-11-14 ENCOUNTER — Ambulatory Visit (INDEPENDENT_AMBULATORY_CARE_PROVIDER_SITE_OTHER): Payer: 59 | Admitting: Physician Assistant

## 2013-11-14 VITALS — BP 124/80 | HR 60 | Temp 98.2°F | Ht 69.0 in | Wt 187.1 lb

## 2013-11-14 DIAGNOSIS — G4700 Insomnia, unspecified: Secondary | ICD-10-CM

## 2013-11-14 DIAGNOSIS — F32A Depression, unspecified: Secondary | ICD-10-CM

## 2013-11-14 DIAGNOSIS — F329 Major depressive disorder, single episode, unspecified: Secondary | ICD-10-CM

## 2013-11-14 DIAGNOSIS — F419 Anxiety disorder, unspecified: Principal | ICD-10-CM

## 2013-11-14 DIAGNOSIS — F341 Dysthymic disorder: Secondary | ICD-10-CM

## 2013-11-14 DIAGNOSIS — Z23 Encounter for immunization: Secondary | ICD-10-CM

## 2013-11-14 NOTE — Progress Notes (Signed)
Pre visit review using our clinic review tool, if applicable. No additional management support is needed unless otherwise documented below in the visit note. 

## 2013-11-14 NOTE — Patient Instructions (Signed)
Continue medications as prescribed.  Do not sleep in the room with a TV on, this will make it harder to get restful sleep.  Also consider a change in mattress.  If you are getting restful sleep when lying on the couch, it is likely your bed is too firm or too soft.  Follow-up in 6 months.

## 2013-11-15 DIAGNOSIS — G4700 Insomnia, unspecified: Secondary | ICD-10-CM | POA: Insufficient documentation

## 2013-11-15 NOTE — Assessment & Plan Note (Signed)
Giving the fact that patient gets restful sleep when he sleeps somewhere besides his own bed, difficulty falling asleep is likely not related to anxiety or depression. Encourage patient to not try to sleep with the TV on in his bedroom. He and his wife may need to consider getting a new mattress. Can try a warm glass of milk and melatonin at bedtime. Please return to clinic if symptoms not improving.

## 2013-11-15 NOTE — Assessment & Plan Note (Signed)
Patient doing well. Continue current regimen.

## 2013-11-15 NOTE — Progress Notes (Signed)
Patient presents to clinic today for follow up for anxiety and depression. Patient states he's been doing well on the Zoloft and Xanax. Denies panic attack, chest tightness, shortness of breath or palpitations. Denies depressed mood or anhedonia. Denies suicidal thought or ideation. Is tolerating the medications. Denies GI upset erectile dysfunction currently. Patient does endorse occasional insomnia. Patient states that he has difficulty falling asleep. Once asleep however, he is able to stay asleep. Patient has tried melatonin in the past with some relief of symptoms. Patient is wondering if he difficulty falling asleep could be due to the fact that he leaves the TV running while he is trying to go to sleep. Patient also states that if tries to sleep on the couch instead of in his bed, he falls asleep quicker and has restful sleep. Patient states the mattress and his wife sleep on his older, and his never really liked it. Denies excessive daytime somnolence. Denies history of hypertension or sleep apnea.  Past Medical History  Diagnosis Date  . Hypercholesteremia   . Anxiety   . Depression     Current Outpatient Prescriptions on File Prior to Visit  Medication Sig Dispense Refill  . ALPRAZolam (XANAX) 0.5 MG tablet Take up to twice daily as needed for breakthrough anxiety  30 tablet  0  . atorvastatin (LIPITOR) 20 MG tablet Take 1 tablet (20 mg total) by mouth daily.  30 tablet  2  . esomeprazole (NEXIUM) 20 MG packet Take 20 mg by mouth daily before breakfast.  30 each  3  . fenofibrate 160 MG tablet Take 1 tablet (160 mg total) by mouth daily.  30 tablet  2  . sertraline (ZOLOFT) 100 MG tablet TAKE 1 TABLET (100 MG TOTAL) BY MOUTH DAILY.  30 tablet  2   No current facility-administered medications on file prior to visit.    No Known Allergies  Family History  Problem Relation Age of Onset  . Hypertension Father   . Non-Hodgkin's lymphoma Father   . Diabetes Father     History    Social History  . Marital Status: Married    Spouse Name: N/A    Number of Children: N/A  . Years of Education: N/A   Social History Main Topics  . Smoking status: Current Every Day Smoker -- 1.00 packs/day for 5 years  . Smokeless tobacco: Never Used  . Alcohol Use: 3.6 oz/week    6 Cans of beer per week  . Drug Use: No  . Sexual Activity: Yes    Birth Control/ Protection: None   Other Topics Concern  . None   Social History Narrative  . None    Review of Systems - See HPI.  All other ROS are negative.  Filed Vitals:   11/14/13 1707  BP: 124/80  Pulse: 60  Temp: 98.2 F (36.8 C)    Physical Exam  Vitals reviewed. Constitutional: He is oriented to person, place, and time and well-developed, well-nourished, and in no distress.  HENT:  Head: Normocephalic and atraumatic.  Eyes: Conjunctivae are normal.  Neck: Neck supple.  Cardiovascular: Normal rate, regular rhythm, normal heart sounds and intact distal pulses.   Pulmonary/Chest: Effort normal and breath sounds normal.  Lymphadenopathy:    He has no cervical adenopathy.  Neurological: He is alert and oriented to person, place, and time.  Skin: Skin is warm and dry.  Psychiatric: Memory, affect and judgment normal.    Recent Results (from the past 2160 hour(s))  LIPID PANEL  Status: Abnormal   Collection Time    09/14/13 12:03 PM      Result Value Range   Cholesterol 149  0 - 200 mg/dL   Comment: ATP III Classification:           < 200        mg/dL        Desirable          200 - 239     mg/dL        Borderline High          >= 240        mg/dL        High         Triglycerides 161 (*) <150 mg/dL   HDL 35 (*) >39 mg/dL   Total CHOL/HDL Ratio 4.3     VLDL 32  0 - 40 mg/dL   LDL Cholesterol 82  0 - 99 mg/dL   Comment:       Total Cholesterol/HDL Ratio:CHD Risk                            Coronary Heart Disease Risk Table                                            Men       Women              1/2  Average Risk              3.4        3.3                  Average Risk              5.0        4.4               2X Average Risk              9.6        7.1               3X Average Risk             23.4       11.0     Use the calculated Patient Ratio above and the CHD Risk table      to determine the patient's CHD Risk.     ATP III Classification (LDL):           < 100        mg/dL         Optimal          100 - 129     mg/dL         Near or Above Optimal          130 - 159     mg/dL         Borderline High          160 - 189     mg/dL         High           > 190        mg/dL         Very High          Assessment/Plan:  No problem-specific assessment & plan notes found for this encounter.

## 2013-11-24 ENCOUNTER — Telehealth: Payer: Self-pay | Admitting: Physician Assistant

## 2013-11-24 NOTE — Telephone Encounter (Signed)
Relevant patient education mailed to patient.  

## 2013-11-27 ENCOUNTER — Telehealth: Payer: Self-pay | Admitting: Physician Assistant

## 2013-11-27 NOTE — Telephone Encounter (Signed)
Relevant patient education mailed to patient.  

## 2013-12-31 ENCOUNTER — Other Ambulatory Visit: Payer: Self-pay | Admitting: Physician Assistant

## 2013-12-31 NOTE — Telephone Encounter (Signed)
Rx request to pharmacy/SLS  

## 2014-02-05 ENCOUNTER — Other Ambulatory Visit: Payer: Self-pay | Admitting: Physician Assistant

## 2014-05-09 ENCOUNTER — Ambulatory Visit (INDEPENDENT_AMBULATORY_CARE_PROVIDER_SITE_OTHER): Payer: BC Managed Care – PPO | Admitting: Physician Assistant

## 2014-05-09 ENCOUNTER — Encounter: Payer: Self-pay | Admitting: Physician Assistant

## 2014-05-09 ENCOUNTER — Telehealth: Payer: Self-pay | Admitting: Physician Assistant

## 2014-05-09 ENCOUNTER — Other Ambulatory Visit: Payer: Self-pay | Admitting: Physician Assistant

## 2014-05-09 VITALS — BP 116/86 | HR 66 | Temp 98.1°F | Resp 16 | Ht 69.0 in | Wt 193.2 lb

## 2014-05-09 DIAGNOSIS — R42 Dizziness and giddiness: Secondary | ICD-10-CM

## 2014-05-09 LAB — CBC
HEMATOCRIT: 42.7 % (ref 39.0–52.0)
HEMOGLOBIN: 15.5 g/dL (ref 13.0–17.0)
MCH: 30.3 pg (ref 26.0–34.0)
MCHC: 36.3 g/dL — AB (ref 30.0–36.0)
MCV: 83.4 fL (ref 78.0–100.0)
Platelets: 173 10*3/uL (ref 150–400)
RBC: 5.12 MIL/uL (ref 4.22–5.81)
RDW: 12.9 % (ref 11.5–15.5)
WBC: 5.8 10*3/uL (ref 4.0–10.5)

## 2014-05-09 LAB — BASIC METABOLIC PANEL
BUN: 19 mg/dL (ref 6–23)
CO2: 27 mEq/L (ref 19–32)
CREATININE: 1.16 mg/dL (ref 0.50–1.35)
Calcium: 9.8 mg/dL (ref 8.4–10.5)
Chloride: 103 mEq/L (ref 96–112)
GLUCOSE: 100 mg/dL — AB (ref 70–99)
POTASSIUM: 4.8 meq/L (ref 3.5–5.3)
SODIUM: 138 meq/L (ref 135–145)

## 2014-05-09 LAB — HEMOGLOBIN A1C
Hgb A1c MFr Bld: 5.5 % (ref <5.7)
Mean Plasma Glucose: 111 mg/dL (ref <117)

## 2014-05-09 LAB — POCT CBG (FASTING - GLUCOSE)-MANUAL ENTRY: GLUCOSE FASTING, POC: 106 mg/dL — AB (ref 70–99)

## 2014-05-09 NOTE — Telephone Encounter (Signed)
Relevant patient education mailed to patient.  

## 2014-05-09 NOTE — Assessment & Plan Note (Signed)
Physical exam unremarkable.  POC glucose negative for hypoglycemia.  OVS are negative. Will obtain CBC and BMP. Encourage increase hydration and more frequent meals.  Monitor for triggers.  Follow-up if no symptom improvement over the next week.

## 2014-05-09 NOTE — Progress Notes (Signed)
Patient presents to clinic today c/o 2 weeks of intermittent episodes of lightheadedness, lasting < 10 seconds each, that occur while sitting at work.  Patient denies nasal congestion, ear pressure, ear fullness, tinnitus, change in hearing.  Denies vision changes.  Denies fatigue or malaise.  Is staying well hydrated.  Does not eat regularly while at work.  Patient without history of hypoglycemia or DM.  Patient denies known stressors but he is dealing with a lot at work.   Past Medical History  Diagnosis Date  . Hypercholesteremia   . Anxiety   . Depression     Current Outpatient Prescriptions on File Prior to Visit  Medication Sig Dispense Refill  . ALPRAZolam (XANAX) 0.5 MG tablet Take up to twice daily as needed for breakthrough anxiety  30 tablet  0  . atorvastatin (LIPITOR) 20 MG tablet TAKE 1 TABLET (20 MG TOTAL) BY MOUTH DAILY.  30 tablet  2  . esomeprazole (NEXIUM) 20 MG packet Take 20 mg by mouth daily before breakfast.  30 each  3  . fenofibrate 160 MG tablet TAKE 1 TABLET (160 MG TOTAL) BY MOUTH DAILY.  30 tablet  2   No current facility-administered medications on file prior to visit.    No Known Allergies  Family History  Problem Relation Age of Onset  . Hypertension Father   . Non-Hodgkin's lymphoma Father   . Diabetes Father     History   Social History  . Marital Status: Married    Spouse Name: N/A    Number of Children: N/A  . Years of Education: N/A   Social History Main Topics  . Smoking status: Current Every Day Smoker -- 1.00 packs/day for 5 years  . Smokeless tobacco: Never Used  . Alcohol Use: 3.6 oz/week    6 Cans of beer per week  . Drug Use: No  . Sexual Activity: Yes    Birth Control/ Protection: None   Other Topics Concern  . None   Social History Narrative  . None    Review of Systems - See HPI.  All other ROS are negative.  BP 116/86  Pulse 66  Temp(Src) 98.1 F (36.7 C) (Oral)  Resp 16  Ht 5\' 9"  (1.753 m)  Wt 193 lb 4 oz  (87.658 kg)  BMI 28.53 kg/m2  SpO2 97%  Physical Exam  Vitals reviewed. Constitutional: He is oriented to person, place, and time and well-developed, well-nourished, and in no distress.  HENT:  Head: Normocephalic and atraumatic.  Right Ear: External ear normal.  Left Ear: External ear normal.  Nose: Nose normal.  Mouth/Throat: Oropharynx is clear and moist. No oropharyngeal exudate.  TM within normal limits bilaterally.   Eyes: Conjunctivae and EOM are normal. Pupils are equal, round, and reactive to light.  Neck: Neck supple.  Cardiovascular: Normal rate, regular rhythm, normal heart sounds and intact distal pulses.   Pulmonary/Chest: Effort normal and breath sounds normal. No respiratory distress. He has no wheezes. He has no rales. He exhibits no tenderness.  Lymphadenopathy:    He has no cervical adenopathy.  Neurological: He is alert and oriented to person, place, and time.  Skin: Skin is warm and dry. No rash noted.  Psychiatric: Affect normal.    Recent Results (from the past 2160 hour(s))  POCT CBG (FASTING - GLUCOSE)-MANUAL ENTRY     Status: Abnormal   Collection Time    05/09/14  7:53 AM      Result Value Ref Range  Glucose Fasting, POC 106 (*) 70 - 99 mg/dL  CBC     Status: Abnormal   Collection Time    05/09/14 10:24 AM      Result Value Ref Range   WBC 5.8  4.0 - 10.5 K/uL   RBC 5.12  4.22 - 5.81 MIL/uL   Hemoglobin 15.5  13.0 - 17.0 g/dL   HCT 42.7  39.0 - 52.0 %   MCV 83.4  78.0 - 100.0 fL   MCH 30.3  26.0 - 34.0 pg   MCHC 36.3 (*) 30.0 - 36.0 g/dL   RDW 12.9  11.5 - 15.5 %   Platelets 173  150 - 400 K/uL  BASIC METABOLIC PANEL     Status: Abnormal   Collection Time    05/09/14 10:24 AM      Result Value Ref Range   Sodium 138  135 - 145 mEq/L   Potassium 4.8  3.5 - 5.3 mEq/L   Chloride 103  96 - 112 mEq/L   CO2 27  19 - 32 mEq/L   Glucose, Bld 100 (*) 70 - 99 mg/dL   BUN 19  6 - 23 mg/dL   Creat 1.16  0.50 - 1.35 mg/dL   Calcium 9.8  8.4 - 10.5  mg/dL  HEMOGLOBIN A1C     Status: None   Collection Time    05/09/14 10:24 AM      Result Value Ref Range   Hemoglobin A1C 5.5  <5.7 %   Comment:                                                                            According to the ADA Clinical Practice Recommendations for 2011, when     HbA1c is used as a screening test:             >=6.5%   Diagnostic of Diabetes Mellitus                (if abnormal result is confirmed)           5.7-6.4%   Increased risk of developing Diabetes Mellitus           References:Diagnosis and Classification of Diabetes Mellitus,Diabetes     JSHF,0263,78(HYIFO 1):S62-S69 and Standards of Medical Care in             Diabetes - 2011,Diabetes Care,2011,34 (Suppl 1):S11-S61.         Mean Plasma Glucose 111  <117 mg/dL    Assessment/Plan: Episodic lightheadedness Physical exam unremarkable.  POC glucose negative for hypoglycemia.  OVS are negative. Will obtain CBC and BMP. Encourage increase hydration and more frequent meals.  Monitor for triggers.  Follow-up if no symptom improvement over the next week.

## 2014-05-09 NOTE — Progress Notes (Signed)
Pre visit review using our clinic review tool, if applicable. No additional management support is needed unless otherwise documented below in the visit note/SLS  

## 2014-05-09 NOTE — Patient Instructions (Signed)
Please obtain labs.  I will call you with your results.  Eat more frequent meals/snacks throughout the day.  Continue staying well hydrated.  I would even add a daily gatorade to keep with you at work.  If symptoms are not improving over the next week, please call or return to clinic.

## 2014-05-09 NOTE — Telephone Encounter (Signed)
Patient left message requesting call back, his discussed lab results with his wife and she has further questions

## 2014-05-13 ENCOUNTER — Telehealth: Payer: Self-pay | Admitting: *Deleted

## 2014-05-13 NOTE — Telephone Encounter (Signed)
Opened in error

## 2014-05-13 NOTE — Telephone Encounter (Signed)
Spoke with pt and answered questions. Provided hgb a1c result and pt voices understanding.

## 2014-05-15 ENCOUNTER — Ambulatory Visit: Payer: 59 | Admitting: Physician Assistant

## 2014-05-23 ENCOUNTER — Encounter: Payer: Self-pay | Admitting: Physician Assistant

## 2014-05-23 ENCOUNTER — Ambulatory Visit (INDEPENDENT_AMBULATORY_CARE_PROVIDER_SITE_OTHER): Payer: BC Managed Care – PPO | Admitting: Physician Assistant

## 2014-05-23 VITALS — BP 114/78 | HR 62 | Temp 97.7°F | Resp 16 | Ht 69.0 in | Wt 192.0 lb

## 2014-05-23 DIAGNOSIS — F32A Depression, unspecified: Secondary | ICD-10-CM

## 2014-05-23 DIAGNOSIS — E785 Hyperlipidemia, unspecified: Secondary | ICD-10-CM

## 2014-05-23 DIAGNOSIS — F329 Major depressive disorder, single episode, unspecified: Secondary | ICD-10-CM

## 2014-05-23 DIAGNOSIS — Z0000 Encounter for general adult medical examination without abnormal findings: Secondary | ICD-10-CM

## 2014-05-23 DIAGNOSIS — F419 Anxiety disorder, unspecified: Principal | ICD-10-CM

## 2014-05-23 DIAGNOSIS — F341 Dysthymic disorder: Secondary | ICD-10-CM

## 2014-05-23 LAB — HEPATIC FUNCTION PANEL
ALK PHOS: 43 U/L (ref 39–117)
ALT: 22 U/L (ref 0–53)
AST: 17 U/L (ref 0–37)
Albumin: 4.6 g/dL (ref 3.5–5.2)
BILIRUBIN DIRECT: 0.1 mg/dL (ref 0.0–0.3)
BILIRUBIN TOTAL: 0.8 mg/dL (ref 0.2–1.2)
Indirect Bilirubin: 0.7 mg/dL (ref 0.2–1.2)
Total Protein: 7.2 g/dL (ref 6.0–8.3)

## 2014-05-23 LAB — LIPID PANEL
CHOLESTEROL: 174 mg/dL (ref 0–200)
HDL: 40 mg/dL (ref >39)
LDL CALC: 99 mg/dL (ref 0–99)
TRIGLYCERIDES: 173 mg/dL — AB (ref <150)
Total CHOL/HDL Ratio: 4.4 Ratio
VLDL: 35 mg/dL (ref 0–40)

## 2014-05-23 LAB — CBC
HCT: 43.9 % (ref 39.0–52.0)
HEMOGLOBIN: 15.7 g/dL (ref 13.0–17.0)
MCH: 30.4 pg (ref 26.0–34.0)
MCHC: 35.8 g/dL (ref 30.0–36.0)
MCV: 85.1 fL (ref 78.0–100.0)
PLATELETS: 183 10*3/uL (ref 150–400)
RBC: 5.16 MIL/uL (ref 4.22–5.81)
RDW: 13.4 % (ref 11.5–15.5)
WBC: 6 10*3/uL (ref 4.0–10.5)

## 2014-05-23 LAB — BASIC METABOLIC PANEL
BUN: 21 mg/dL (ref 6–23)
CO2: 27 mEq/L (ref 19–32)
Calcium: 9.5 mg/dL (ref 8.4–10.5)
Chloride: 103 mEq/L (ref 96–112)
Creat: 1.2 mg/dL (ref 0.50–1.35)
Glucose, Bld: 103 mg/dL — ABNORMAL HIGH (ref 70–99)
POTASSIUM: 4.2 meq/L (ref 3.5–5.3)
Sodium: 139 mEq/L (ref 135–145)

## 2014-05-23 MED ORDER — ALPRAZOLAM 0.5 MG PO TABS: ORAL_TABLET | ORAL | Status: DC

## 2014-05-23 NOTE — Patient Instructions (Signed)
Please obtain labs.  I will call you with your results.  Continue your current medication regimen for now.  Follow-up with me in 6 months.  Return sooner if needed.  Congratulations on the upcoming new addition to your family.  Preventive Care for Adults A healthy lifestyle and preventive care can promote health and wellness. Preventive health guidelines for men include the following key practices:  A routine yearly physical is a good way to check with your health care provider about your health and preventative screening. It is a chance to share any concerns and updates on your health and to receive a thorough exam.  Visit your dentist for a routine exam and preventative care every 6 months. Brush your teeth twice a day and floss once a day. Good oral hygiene prevents tooth decay and gum disease.  The frequency of eye exams is based on your age, health, family medical history, use of contact lenses, and other factors. Follow your health care provider's recommendations for frequency of eye exams.  Eat a healthy diet. Foods such as vegetables, fruits, whole grains, low-fat dairy products, and lean protein foods contain the nutrients you need without too many calories. Decrease your intake of foods high in solid fats, added sugars, and salt. Eat the right amount of calories for you.Get information about a proper diet from your health care provider, if necessary.  Regular physical exercise is one of the most important things you can do for your health. Most adults should get at least 150 minutes of moderate-intensity exercise (any activity that increases your heart rate and causes you to sweat) each week. In addition, most adults need muscle-strengthening exercises on 2 or more days a week.  Maintain a healthy weight. The body mass index (BMI) is a screening tool to identify possible weight problems. It provides an estimate of body fat based on height and weight. Your health care provider can find your  BMI and can help you achieve or maintain a healthy weight.For adults 20 years and older:  A BMI below 18.5 is considered underweight.  A BMI of 18.5 to 24.9 is normal.  A BMI of 25 to 29.9 is considered overweight.  A BMI of 30 and above is considered obese.  Maintain normal blood lipids and cholesterol levels by exercising and minimizing your intake of saturated fat. Eat a balanced diet with plenty of fruit and vegetables. Blood tests for lipids and cholesterol should begin at age 38 and be repeated every 5 years. If your lipid or cholesterol levels are high, you are over 50, or you are at high risk for heart disease, you may need your cholesterol levels checked more frequently.Ongoing high lipid and cholesterol levels should be treated with medicines if diet and exercise are not working.  If you smoke, find out from your health care provider how to quit. If you do not use tobacco, do not start.  Lung cancer screening is recommended for adults aged 55-80 years who are at high risk for developing lung cancer because of a history of smoking. A yearly low-dose CT scan of the lungs is recommended for people who have at least a 30-pack-year history of smoking and are a current smoker or have quit within the past 15 years. A pack year of smoking is smoking an average of 1 pack of cigarettes a day for 1 year (for example: 1 pack a day for 30 years or 2 packs a day for 15 years). Yearly screening should continue until the  smoker has stopped smoking for at least 15 years. Yearly screening should be stopped for people who develop a health problem that would prevent them from having lung cancer treatment.  If you choose to drink alcohol, do not have more than 2 drinks per day. One drink is considered to be 12 ounces (355 mL) of beer, 5 ounces (148 mL) of wine, or 1.5 ounces (44 mL) of liquor.  Avoid use of street drugs. Do not share needles with anyone. Ask for help if you need support or instructions  about stopping the use of drugs.  High blood pressure causes heart disease and increases the risk of stroke. Your blood pressure should be checked at least every 1-2 years. Ongoing high blood pressure should be treated with medicines, if weight loss and exercise are not effective.  If you are 49-74 years old, ask your health care provider if you should take aspirin to prevent heart disease.  Diabetes screening involves taking a blood sample to check your fasting blood sugar level. This should be done once every 3 years, after age 108, if you are within normal weight and without risk factors for diabetes. Testing should be considered at a younger age or be carried out more frequently if you are overweight and have at least 1 risk factor for diabetes.  Colorectal cancer can be detected and often prevented. Most routine colorectal cancer screening begins at the age of 33 and continues through age 45. However, your health care provider may recommend screening at an earlier age if you have risk factors for colon cancer. On a yearly basis, your health care provider may provide home test kits to check for hidden blood in the stool. Use of a small camera at the end of a tube to directly examine the colon (sigmoidoscopy or colonoscopy) can detect the earliest forms of colorectal cancer. Talk to your health care provider about this at age 5, when routine screening begins. Direct exam of the colon should be repeated every 5-10 years through age 61, unless early forms of precancerous polyps or small growths are found.  People who are at an increased risk for hepatitis B should be screened for this virus. You are considered at high risk for hepatitis B if:  You were born in a country where hepatitis B occurs often. Talk with your health care provider about which countries are considered high risk.  Your parents were born in a high-risk country and you have not received a shot to protect against hepatitis B  (hepatitis B vaccine).  You have HIV or AIDS.  You use needles to inject street drugs.  You live with, or have sex with, someone who has hepatitis B.  You are a man who has sex with other men (MSM).  You get hemodialysis treatment.  You take certain medicines for conditions such as cancer, organ transplantation, and autoimmune conditions.  Hepatitis C blood testing is recommended for all people born from [birth year] through 1965 and any individual with known risks for hepatitis C.  Practice safe sex. Use condoms and avoid high-risk sexual practices to reduce the spread of sexually transmitted infections (STIs). STIs include gonorrhea, chlamydia, syphilis, trichomonas, herpes, HPV, and human immunodeficiency virus (HIV). Herpes, HIV, and HPV are viral illnesses that have no cure. They can result in disability, cancer, and death.  If you are at risk of being infected with HIV, it is recommended that you take a prescription medicine daily to prevent HIV infection. This is called preexposure  prophylaxis (PrEP). You are considered at risk if:  You are a man who has sex with other men (MSM) and have other risk factors.  You are a heterosexual man, are sexually active, and are at increased risk for HIV infection.  You take drugs by injection.  You are sexually active with a partner who has HIV.  Talk with your health care provider about whether you are at high risk of being infected with HIV. If you choose to begin PrEP, you should first be tested for HIV. You should then be tested every 3 months for as long as you are taking PrEP.  A one-time screening for abdominal aortic aneurysm (AAA) and surgical repair of large AAAs by ultrasound are recommended for men ages 58 to 77 years who are current or former smokers.  Healthy men should no longer receive prostate-specific antigen (PSA) blood tests as part of routine cancer screening. Talk with your health care provider about prostate cancer  screening.  Testicular cancer screening is not recommended for adult males who have no symptoms. Screening includes self-exam, a health care provider exam, and other screening tests. Consult with your health care provider about any symptoms you have or any concerns you have about testicular cancer.  Use sunscreen. Apply sunscreen liberally and repeatedly throughout the day. You should seek shade when your shadow is shorter than you. Protect yourself by wearing long sleeves, pants, a wide-brimmed hat, and sunglasses year round, whenever you are outdoors.  Once a month, do a whole-body skin exam, using a mirror to look at the skin on your back. Tell your health care provider about new moles, moles that have irregular borders, moles that are larger than a pencil eraser, or moles that have changed in shape or color.  Stay current with required vaccines (immunizations).  Influenza vaccine. All adults should be immunized every year.  Tetanus, diphtheria, and acellular pertussis (Td, Tdap) vaccine. An adult who has not previously received Tdap or who does not know his vaccine status should receive 1 dose of Tdap. This initial dose should be followed by tetanus and diphtheria toxoids (Td) booster doses every 10 years. Adults with an unknown or incomplete history of completing a 3-dose immunization series with Td-containing vaccines should begin or complete a primary immunization series including a Tdap dose. Adults should receive a Td booster every 10 years.  Varicella vaccine. An adult without evidence of immunity to varicella should receive 2 doses or a second dose if he has previously received 1 dose.  Human papillomavirus (HPV) vaccine. Males aged 109-21 years who have not received the vaccine previously should receive the 3-dose series. Males aged 22-26 years may be immunized. Immunization is recommended through the age of 31 years for any male who has sex with males and did not get any or all doses  earlier. Immunization is recommended for any person with an immunocompromised condition through the age of 9 years if he did not get any or all doses earlier. During the 3-dose series, the second dose should be obtained 4-8 weeks after the first dose. The third dose should be obtained 24 weeks after the first dose and 16 weeks after the second dose.  Zoster vaccine. One dose is recommended for adults aged 27 years or older unless certain conditions are present.  Measles, mumps, and rubella (MMR) vaccine. Adults born before [birth year] generally are considered immune to measles and mumps. Adults born in [birth year] or later should have 1 or more doses of MMR vaccine  unless there is a contraindication to the vaccine or there is laboratory evidence of immunity to each of the three diseases. A routine second dose of MMR vaccine should be obtained at least 28 days after the first dose for students attending postsecondary schools, health care workers, or international travelers. People who received inactivated measles vaccine or an unknown type of measles vaccine during 1963-1967 should receive 2 doses of MMR vaccine. People who received inactivated mumps vaccine or an unknown type of mumps vaccine before 1979 and are at high risk for mumps infection should consider immunization with 2 doses of MMR vaccine. Unvaccinated health care workers born before [birth year] who lack laboratory evidence of measles, mumps, or rubella immunity or laboratory confirmation of disease should consider measles and mumps immunization with 2 doses of MMR vaccine or rubella immunization with 1 dose of MMR vaccine.  Pneumococcal 13-valent conjugate (PCV13) vaccine. When indicated, a person who is uncertain of his immunization history and has no record of immunization should receive the PCV13 vaccine. An adult aged 90 years or older who has certain medical conditions and has not been previously immunized should receive 1 dose of PCV13 vaccine. This PCV13  should be followed with a dose of pneumococcal polysaccharide (PPSV23) vaccine. The PPSV23 vaccine dose should be obtained at least 8 weeks after the dose of PCV13 vaccine. An adult aged 70 years or older who has certain medical conditions and previously received 1 or more doses of PPSV23 vaccine should receive 1 dose of PCV13. The PCV13 vaccine dose should be obtained 1 or more years after the last PPSV23 vaccine dose.  Pneumococcal polysaccharide (PPSV23) vaccine. When PCV13 is also indicated, PCV13 should be obtained first. All adults aged 41 years and older should be immunized. An adult younger than age 39 years who has certain medical conditions should be immunized. Any person who resides in a nursing home or long-term care facility should be immunized. An adult smoker should be immunized. People with an immunocompromised condition and certain other conditions should receive both PCV13 and PPSV23 vaccines. People with human immunodeficiency virus (HIV) infection should be immunized as soon as possible after diagnosis. Immunization during chemotherapy or radiation therapy should be avoided. Routine use of PPSV23 vaccine is not recommended for American Indians, [place] Natives, or people younger than 65 years unless there are medical conditions that require PPSV23 vaccine. When indicated, people who have unknown immunization and have no record of immunization should receive PPSV23 vaccine. One-time revaccination 5 years after the first dose of PPSV23 is recommended for people aged 19-64 years who have chronic kidney failure, nephrotic syndrome, asplenia, or immunocompromised conditions. People who received 1-2 doses of PPSV23 before age 53 years should receive another dose of PPSV23 vaccine at age 87 years or later if at least 5 years have passed since the previous dose. Doses of PPSV23 are not needed for people immunized with PPSV23 at or after age 97 years.  Meningococcal vaccine. Adults with asplenia or  persistent complement component deficiencies should receive 2 doses of quadrivalent meningococcal conjugate (MenACWY-D) vaccine. The doses should be obtained at least 2 months apart. Microbiologists working with certain meningococcal bacteria, [employer] recruits, people at risk during an outbreak, and people who travel to or live in countries with a high rate of meningitis should be immunized. A first-year college student up through age 55 years who is living in a residence hall should receive a dose if he did not receive a dose on or after his 16th birthday.  Adults who have certain high-risk conditions should receive one or more doses of vaccine.  Hepatitis A vaccine. Adults who wish to be protected from this disease, have certain high-risk conditions, work with hepatitis A-infected animals, work in hepatitis A research labs, or travel to or work in countries with a high rate of hepatitis A should be immunized. Adults who were previously unvaccinated and who anticipate close contact with an international adoptee during the first 60 days after arrival in the [place] from a country with a high rate of hepatitis A should be immunized.  Hepatitis B vaccine. Adults should be immunized if they wish to be protected from this disease, have certain high-risk conditions, may be exposed to blood or other infectious body fluids, are household contacts or sex partners of hepatitis B positive people, are clients or workers in certain care facilities, or travel to or work in countries with a high rate of hepatitis B.  Haemophilus influenzae type b (Hib) vaccine. A previously unvaccinated person with asplenia or sickle cell disease or having a scheduled splenectomy should receive 1 dose of Hib vaccine. Regardless of previous immunization, a recipient of a hematopoietic stem cell transplant should receive a 3-dose series 6-12 months after his successful transplant. Hib vaccine is not recommended for adults with HIV  infection. Preventive Service / Frequency Ages 33 to 27  Blood pressure check.** / Every 1 to 2 years.  Lipid and cholesterol check.** / Every 5 years beginning at age 16.  Hepatitis C blood test.** / For any individual with known risks for hepatitis C.  Skin self-exam. / Monthly.  Influenza vaccine. / Every year.  Tetanus, diphtheria, and acellular pertussis (Tdap, Td) vaccine.** / Consult your health care provider. 1 dose of Td every 10 years.  Varicella vaccine.** / Consult your health care provider.  HPV vaccine. / 3 doses over 6 months, if 51 or younger.  Measles, mumps, rubella (MMR) vaccine.** / You need at least 1 dose of MMR if you were born in 1957 or later. You may also need a second dose.  Pneumococcal 13-valent conjugate (PCV13) vaccine.** / Consult your health care provider.  Pneumococcal polysaccharide (PPSV23) vaccine.** / 1 to 2 doses if you smoke cigarettes or if you have certain conditions.  Meningococcal vaccine.** / 1 dose if you are age 71 to 69 years and a Market researcher living in a residence hall, or have one of several medical conditions. You may also need additional booster doses.  Hepatitis A vaccine.** / Consult your health care provider.  Hepatitis B vaccine.** / Consult your health care provider.  Haemophilus influenzae type b (Hib) vaccine.** / Consult your health care provider. Ages 15 to 13  Blood pressure check.** / Every 1 to 2 years.  Lipid and cholesterol check.** / Every 5 years beginning at age 42.  Lung cancer screening. / Every year if you are aged 70-80 years and have a 30-pack-year history of smoking and currently smoke or have quit within the past 15 years. Yearly screening is stopped once you have quit smoking for at least 15 years or develop a health problem that would prevent you from having lung cancer treatment.  Fecal occult blood test (FOBT) of stool. / Every year beginning at age 69 and continuing until age 65.  You may not have to do this test if you get a colonoscopy every 10 years.  Flexible sigmoidoscopy** or colonoscopy.** / Every 5 years for a flexible sigmoidoscopy or every 10 years for a  colonoscopy beginning at age 25 and continuing until age 3.  Hepatitis C blood test.** / For all people born from [birth year] through 1965 and any individual with known risks for hepatitis C.  Skin self-exam. / Monthly.  Influenza vaccine. / Every year.  Tetanus, diphtheria, and acellular pertussis (Tdap/Td) vaccine.** / Consult your health care provider. 1 dose of Td every 10 years.  Varicella vaccine.** / Consult your health care provider.  Zoster vaccine.** / 1 dose for adults aged 38 years or older.  Measles, mumps, rubella (MMR) vaccine.** / You need at least 1 dose of MMR if you were born in 1957 or later. You may also need a second dose.  Pneumococcal 13-valent conjugate (PCV13) vaccine.** / Consult your health care provider.  Pneumococcal polysaccharide (PPSV23) vaccine.** / 1 to 2 doses if you smoke cigarettes or if you have certain conditions.  Meningococcal vaccine.** / Consult your health care provider.  Hepatitis A vaccine.** / Consult your health care provider.  Hepatitis B vaccine.** / Consult your health care provider.  Haemophilus influenzae type b (Hib) vaccine.** / Consult your health care provider. Ages 37 and over  Blood pressure check.** / Every 1 to 2 years.  Lipid and cholesterol check.**/ Every 5 years beginning at age 74.  Lung cancer screening. / Every year if you are aged 76-80 years and have a 30-pack-year history of smoking and currently smoke or have quit within the past 15 years. Yearly screening is stopped once you have quit smoking for at least 15 years or develop a health problem that would prevent you from having lung cancer treatment.  Fecal occult blood test (FOBT) of stool. / Every year beginning at age 40 and continuing until age 69. You may not have to do this  test if you get a colonoscopy every 10 years.  Flexible sigmoidoscopy** or colonoscopy.** / Every 5 years for a flexible sigmoidoscopy or every 10 years for a colonoscopy beginning at age 10 and continuing until age 20.  Hepatitis C blood test.** / For all people born from [birth year] through 1966 and any individual with known risks for hepatitis C.  Abdominal aortic aneurysm (AAA) screening.** / A one-time screening for ages 74 to 66 years who are current or former smokers.  Skin self-exam. / Monthly.  Influenza vaccine. / Every year.  Tetanus, diphtheria, and acellular pertussis (Tdap/Td) vaccine.** / 1 dose of Td every 10 years.  Varicella vaccine.** / Consult your health care provider.  Zoster vaccine.** / 1 dose for adults aged 66 years or older.  Pneumococcal 13-valent conjugate (PCV13) vaccine.** / Consult your health care provider.  Pneumococcal polysaccharide (PPSV23) vaccine.** / 1 dose for all adults aged 35 years and older.  Meningococcal vaccine.** / Consult your health care provider.  Hepatitis A vaccine.** / Consult your health care provider.  Hepatitis B vaccine.** / Consult your health care provider.  Haemophilus influenzae type b (Hib) vaccine.** / Consult your health care provider. **Family history and personal history of risk and conditions may change your health care provider's recommendations. Document Released: 12/07/2001 Document Revised: 10/16/2013 Document Reviewed: 03/08/2011 [facility] Patient Information 2015 [place], [place]. This information is not intended to replace advice given to you by your health care provider. Make sure you discuss any questions you have with your health care provider.

## 2014-05-23 NOTE — Assessment & Plan Note (Signed)
Medical history reviewed.  Immunizations up-to-date.  Will obtain fasting labs today.

## 2014-05-23 NOTE — Assessment & Plan Note (Signed)
Will obtain fasting lipid panel and hepatic panel.  Continue current regimen.  Continue diet and exercise.

## 2014-05-23 NOTE — Progress Notes (Signed)
Patient presents to clinic today for annual exam.  Patient is fasting for labs.  Needs refill of Xanax for anxiety.  Uses very sparingly.  Last Rx filled several months ago.  Patient also taking Sertraline with good relief.  Currently on Nexium for acid reflux relief.  Still endorses rare breakthrough symptoms.    Health Maintenance: Dental -- up-to-date Vision -- overdue Immunizations -- Immunizations are up-to-date  Past Medical History  Diagnosis Date  . Hypercholesteremia   . Anxiety   . Depression     Past Surgical History  Procedure Laterality Date  . Elbow fracture surgery  07.16.14    Radial Repair  . Vasectomy    . Vasectomy reversal      Current Outpatient Prescriptions on File Prior to Visit  Medication Sig Dispense Refill  . atorvastatin (LIPITOR) 20 MG tablet TAKE 1 TABLET (20 MG TOTAL) BY MOUTH DAILY.  30 tablet  2  . esomeprazole (NEXIUM) 20 MG packet Take 20 mg by mouth daily before breakfast.  30 each  3  . fenofibrate 160 MG tablet TAKE 1 TABLET (160 MG TOTAL) BY MOUTH DAILY.  30 tablet  2  . sertraline (ZOLOFT) 100 MG tablet TAKE 1/2 (50 mg) TABLET BY MOUTH DAILY.       No current facility-administered medications on file prior to visit.    No Known Allergies  Family History  Problem Relation Age of Onset  . Hypertension Father     Living  . Non-Hodgkin's lymphoma Father   . Diabetes Father   . Healthy Mother     Living  . Hypertension Other     Paternal side  . Heart attack Paternal Grandfather   . Healthy Sister     x1  . Healthy Daughter     x3    History   Social History  . Marital Status: Married    Spouse Name: N/A    Number of Children: N/A  . Years of Education: N/A   Occupational History  . Not on file.   Social History Main Topics  . Smoking status: Current Every Day Smoker -- 0.25 packs/day for 5 years  . Smokeless tobacco: Never Used  . Alcohol Use: 3.6 oz/week    6 Cans of beer per week  . Drug Use: No  . Sexual  Activity: Yes    Birth Control/ Protection: None   Other Topics Concern  . Not on file   Social History Narrative  . No narrative on file   Review of Systems  Constitutional: Negative for fever and weight loss.  HENT: Negative for ear discharge, ear pain, hearing loss, nosebleeds and tinnitus.   Eyes: Negative for blurred vision, double vision, photophobia and pain.  Respiratory: Negative for shortness of breath.   Cardiovascular: Negative for chest pain and palpitations.  Gastrointestinal: Positive for heartburn. Negative for nausea, vomiting, abdominal pain, diarrhea, constipation, blood in stool and melena.  Genitourinary: Negative for dysuria, urgency, frequency, hematuria and flank pain.       Nocturia x 0.  No concerns about erectile dysfunction.  Neurological: Negative for dizziness, loss of consciousness and headaches.  Endo/Heme/Allergies: Negative for environmental allergies.  Psychiatric/Behavioral: Negative for depression, suicidal ideas, hallucinations and substance abuse. The patient is nervous/anxious.    BP 114/78  Pulse 62  Temp(Src) 97.7 F (36.5 C) (Oral)  Resp 16  Ht 5\' 9"  (1.753 m)  Wt 192 lb (87.091 kg)  BMI 28.34 kg/m2  SpO2 98%  Physical Exam  Vitals  reviewed. Constitutional: He is oriented to person, place, and time and well-developed, well-nourished, and in no distress.  HENT:  Head: Normocephalic and atraumatic.  Right Ear: External ear normal.  Left Ear: External ear normal.  Nose: Nose normal.  Mouth/Throat: Oropharynx is clear and moist. No oropharyngeal exudate.  TM within normal limits bilaterally.  Eyes: Conjunctivae are normal. Pupils are equal, round, and reactive to light.  Neck: Neck supple.  Cardiovascular: Normal rate, regular rhythm, normal heart sounds and intact distal pulses.   Pulmonary/Chest: Effort normal and breath sounds normal. No respiratory distress. He has no wheezes. He has no rales. He exhibits no tenderness.   Abdominal: Soft. Bowel sounds are normal. He exhibits no distension and no mass. There is no tenderness. There is no rebound and no guarding.  Genitourinary:  Patient defers.  Neurological: He is alert and oriented to person, place, and time.  Skin: Skin is warm and dry.  Psychiatric: Affect normal.    Recent Results (from the past 2160 hour(s))  POCT CBG (FASTING - GLUCOSE)-MANUAL ENTRY     Status: Abnormal   Collection Time    05/09/14  7:53 AM      Result Value Ref Range   Glucose Fasting, POC 106 (*) 70 - 99 mg/dL  CBC     Status: Abnormal   Collection Time    05/09/14 10:24 AM      Result Value Ref Range   WBC 5.8  4.0 - 10.5 K/uL   RBC 5.12  4.22 - 5.81 MIL/uL   Hemoglobin 15.5  13.0 - 17.0 g/dL   HCT 42.7  39.0 - 52.0 %   MCV 83.4  78.0 - 100.0 fL   MCH 30.3  26.0 - 34.0 pg   MCHC 36.3 (*) 30.0 - 36.0 g/dL   RDW 12.9  11.5 - 15.5 %   Platelets 173  150 - 400 K/uL  BASIC METABOLIC PANEL     Status: Abnormal   Collection Time    05/09/14 10:24 AM      Result Value Ref Range   Sodium 138  135 - 145 mEq/L   Potassium 4.8  3.5 - 5.3 mEq/L   Chloride 103  96 - 112 mEq/L   CO2 27  19 - 32 mEq/L   Glucose, Bld 100 (*) 70 - 99 mg/dL   BUN 19  6 - 23 mg/dL   Creat 1.16  0.50 - 1.35 mg/dL   Calcium 9.8  8.4 - 10.5 mg/dL  HEMOGLOBIN A1C     Status: None   Collection Time    05/09/14 10:24 AM      Result Value Ref Range   Hemoglobin A1C 5.5  <5.7 %   Comment:                                                                            According to the ADA Clinical Practice Recommendations for 2011, when     HbA1c is used as a screening test:             >=6.5%   Diagnostic of Diabetes Mellitus                (if abnormal result  is confirmed)           5.7-6.4%   Increased risk of developing Diabetes Mellitus           References:Diagnosis and Classification of Diabetes Mellitus,Diabetes     YBWL,8937,34(KAJGO 1):S62-S69 and Standards of Medical Care in              Diabetes - 2011,Diabetes TLXB,2620,35 (Suppl 1):S11-S61.         Mean Plasma Glucose 111  <117 mg/dL    Assessment/Plan: Visit for preventive health examination Medical history reviewed.  Immunizations up-to-date.  Will obtain fasting labs today.   Hyperlipidemia Will obtain fasting lipid panel and hepatic panel.  Continue current regimen.  Continue diet and exercise.

## 2014-05-23 NOTE — Progress Notes (Signed)
Pre visit review using our clinic review tool, if applicable. No additional management support is needed unless otherwise documented below in the visit note/SLS  

## 2014-05-24 ENCOUNTER — Telehealth: Payer: Self-pay | Admitting: Physician Assistant

## 2014-05-24 LAB — URINALYSIS, ROUTINE W REFLEX MICROSCOPIC
Glucose, UA: NEGATIVE mg/dL
Hgb urine dipstick: NEGATIVE
KETONES UR: NEGATIVE mg/dL
Leukocytes, UA: NEGATIVE
Nitrite: NEGATIVE
PH: 5 (ref 5.0–8.0)
Protein, ur: NEGATIVE mg/dL
SPECIFIC GRAVITY, URINE: 1.028 (ref 1.005–1.030)
Urobilinogen, UA: 1 mg/dL (ref 0.0–1.0)

## 2014-05-24 LAB — TSH: TSH: 0.798 u[IU]/mL (ref 0.350–4.500)

## 2014-05-24 LAB — URINALYSIS, MICROSCOPIC ONLY
BACTERIA UA: NONE SEEN
Casts: NONE SEEN
Crystals: NONE SEEN
Squamous Epithelial / LPF: NONE SEEN

## 2014-05-24 NOTE — Telephone Encounter (Signed)
Labs good overall.  Triglycerides have increased since last check.  Is he still taking the fenofibrate.  It seems the last Rx was given in march. He should be out of medication unless given by another provider.  If he hasn't been taking, we need to reorder and resume medication.  He needs to watch intake of sugars and alcohol.  Continue exercise.

## 2014-05-27 NOTE — Telephone Encounter (Signed)
Spoke with pt who states he is taking medication and still has some in bottle [last Rx 03.09.15, #30x2?]; informed him of last Rx px and that he should be out of medication and to please check at his availability and call back and inform us of if he is needing refill to pharmacy now. Patient understood and agreed/SLS

## 2014-06-22 ENCOUNTER — Other Ambulatory Visit: Payer: Self-pay | Admitting: Physician Assistant

## 2014-07-22 ENCOUNTER — Encounter: Payer: Self-pay | Admitting: Physician Assistant

## 2014-07-22 ENCOUNTER — Other Ambulatory Visit: Payer: Self-pay | Admitting: Physician Assistant

## 2014-07-22 ENCOUNTER — Ambulatory Visit (HOSPITAL_COMMUNITY)
Admission: RE | Admit: 2014-07-22 | Discharge: 2014-07-22 | Disposition: A | Discharge status: Home / Self Care | Payer: BC Managed Care – PPO | Source: Ambulatory Visit | Attending: Physician Assistant | Admitting: Physician Assistant

## 2014-07-22 ENCOUNTER — Ambulatory Visit (HOSPITAL_BASED_OUTPATIENT_CLINIC_OR_DEPARTMENT_OTHER)
Admission: RE | Admit: 2014-07-22 | Discharge: 2014-07-22 | Disposition: A | Discharge status: Home / Self Care | Payer: BC Managed Care – PPO | Source: Ambulatory Visit | Attending: Physician Assistant | Admitting: Physician Assistant

## 2014-07-22 ENCOUNTER — Ambulatory Visit (INDEPENDENT_AMBULATORY_CARE_PROVIDER_SITE_OTHER): Payer: BC Managed Care – PPO | Admitting: Physician Assistant

## 2014-07-22 ENCOUNTER — Ambulatory Visit (HOSPITAL_BASED_OUTPATIENT_CLINIC_OR_DEPARTMENT_OTHER): Admission: RE | Admit: 2014-07-22 | Payer: BC Managed Care – PPO | Source: Ambulatory Visit

## 2014-07-22 VITALS — BP 130/77 | HR 63 | Temp 98.4°F | Resp 16 | Ht 69.0 in | Wt 198.1 lb

## 2014-07-22 DIAGNOSIS — M545 Low back pain, unspecified: Secondary | ICD-10-CM | POA: Insufficient documentation

## 2014-07-22 DIAGNOSIS — R1032 Left lower quadrant pain: Secondary | ICD-10-CM

## 2014-07-22 DIAGNOSIS — M5442 Lumbago with sciatica, left side: Secondary | ICD-10-CM

## 2014-07-22 DIAGNOSIS — R109 Unspecified abdominal pain: Secondary | ICD-10-CM

## 2014-07-22 MED ORDER — METHYLPREDNISOLONE (PAK) 4 MG PO TABS: ORAL_TABLET | ORAL | Status: DC

## 2014-07-22 MED ORDER — TRAMADOL HCL 50 MG PO TABS: 50.0000 mg | ORAL_TABLET | Freq: Three times a day (TID) | ORAL | Status: DC | PRN

## 2014-07-22 NOTE — Progress Notes (Signed)
Pre visit review using our clinic review tool, if applicable. No additional management support is needed unless otherwise documented below in the visit note/SLS  

## 2014-07-22 NOTE — Progress Notes (Signed)
Patient presents to clinic today c/o right inguinal pain after an episode of heavy lifting at work this week.  Denies noticeable mass.  Denies history of herniation.  Endorses pain is affected by ROM. Denies saddle anesthesia.  Patient also complains of left lower back pain x 3 weeks now with radiation of pain into LLE.  Denies numbness or weakness of LLE.  Endorses some tingling.  Denies change to bowel or bladder habits.  Past Medical History  Diagnosis Date  . Hypercholesteremia   . Anxiety   . Depression     Current Outpatient Prescriptions on File Prior to Visit  Medication Sig Dispense Refill  . ALPRAZolam (XANAX) 0.5 MG tablet Take up to twice daily as needed for breakthrough anxiety  60 tablet  0  . atorvastatin (LIPITOR) 20 MG tablet TAKE 1 TABLET (20 MG TOTAL) BY MOUTH DAILY.  30 tablet  6  . esomeprazole (NEXIUM) 20 MG packet Take 20 mg by mouth daily before breakfast.  30 each  3  . fenofibrate 160 MG tablet TAKE 1 TABLET (160 MG TOTAL) BY MOUTH DAILY.  30 tablet  6  . sertraline (ZOLOFT) 100 MG tablet TAKE 1/2 (50 mg) TABLET BY MOUTH DAILY.       No current facility-administered medications on file prior to visit.    No Known Allergies  Family History  Problem Relation Age of Onset  . Hypertension Father     Living  . Non-Hodgkin's lymphoma Father   . Diabetes Father   . Healthy Mother     Living  . Hypertension Other     Paternal side  . Heart attack Paternal Grandfather   . Healthy Sister     x1  . Healthy Daughter     x3    History   Social History  . Marital Status: Married    Spouse Name: N/A    Number of Children: N/A  . Years of Education: N/A   Social History Main Topics  . Smoking status: Current Every Day Smoker -- 0.25 packs/day for 5 years  . Smokeless tobacco: Never Used  . Alcohol Use: 3.6 oz/week    6 Cans of beer per week  . Drug Use: No  . Sexual Activity: Yes    Birth Control/ Protection: None   Other Topics Concern  . None     Social History Narrative  . None    Review of Systems - See HPI.  All other ROS are negative.  BP 130/77  Pulse 63  Temp(Src) 98.4 F (36.9 C) (Oral)  Resp 16  Ht 5\' 9"  (1.753 m)  Wt 198 lb 2 oz (89.869 kg)  BMI 29.24 kg/m2  SpO2 99%  Physical Exam  Vitals reviewed. Constitutional: He is well-developed, well-nourished, and in no distress.  HENT:  Head: Normocephalic and atraumatic.  Eyes: Conjunctivae are normal.  Cardiovascular: Normal rate, regular rhythm, normal heart sounds and intact distal pulses.   Pulmonary/Chest: Effort normal and breath sounds normal. No respiratory distress. He has no wheezes. He has no rales. He exhibits no tenderness.  Abdominal: Soft. Bowel sounds are normal. There is generalized tenderness. No hernia. Hernia confirmed negative in the umbilical area, confirmed negative in the ventral area, confirmed negative in the right inguinal area and confirmed negative in the left inguinal area.  Pain with palpation in right inguinal region.  Pain is reproduced with forward flexion. No mass or hernia palpable on examination.  Genitourinary: Testes/scrotum normal. He exhibits no abnormal testicular  mass and no testicular tenderness.  Musculoskeletal:       Thoracic back: Normal.       Lumbar back: He exhibits tenderness and pain. He exhibits no spasm.    Recent Results (from the past 2160 hour(s))  POCT CBG (FASTING - GLUCOSE)-MANUAL ENTRY     Status: Abnormal   Collection Time    05/09/14  7:53 AM      Result Value Ref Range   Glucose Fasting, POC 106 (*) 70 - 99 mg/dL  CBC     Status: Abnormal   Collection Time    05/09/14 10:24 AM      Result Value Ref Range   WBC 5.8  4.0 - 10.5 K/uL   RBC 5.12  4.22 - 5.81 MIL/uL   Hemoglobin 15.5  13.0 - 17.0 g/dL   HCT 42.7  39.0 - 52.0 %   MCV 83.4  78.0 - 100.0 fL   MCH 30.3  26.0 - 34.0 pg   MCHC 36.3 (*) 30.0 - 36.0 g/dL   RDW 12.9  11.5 - 15.5 %   Platelets 173  150 - 400 K/uL  BASIC METABOLIC PANEL      Status: Abnormal   Collection Time    05/09/14 10:24 AM      Result Value Ref Range   Sodium 138  135 - 145 mEq/L   Potassium 4.8  3.5 - 5.3 mEq/L   Chloride 103  96 - 112 mEq/L   CO2 27  19 - 32 mEq/L   Glucose, Bld 100 (*) 70 - 99 mg/dL   BUN 19  6 - 23 mg/dL   Creat 1.16  0.50 - 1.35 mg/dL   Calcium 9.8  8.4 - 10.5 mg/dL  HEMOGLOBIN A1C     Status: None   Collection Time    05/09/14 10:24 AM      Result Value Ref Range   Hemoglobin A1C 5.5  <5.7 %   Comment:                                                                            According to the ADA Clinical Practice Recommendations for 2011, when     HbA1c is used as a screening test:             >=6.5%   Diagnostic of Diabetes Mellitus                (if abnormal result is confirmed)           5.7-6.4%   Increased risk of developing Diabetes Mellitus           References:Diagnosis and Classification of Diabetes Mellitus,Diabetes     WUJW,1191,47(WGNFA 1):S62-S69 and Standards of Medical Care in             Diabetes - 2011,Diabetes Care,2011,34 (Suppl 1):S11-S61.         Mean Plasma Glucose 111  <117 mg/dL  CBC     Status: None   Collection Time    05/23/14  8:15 AM      Result Value Ref Range   WBC 6.0  4.0 - 10.5 K/uL   RBC 5.16  4.22 - 5.81 MIL/uL  Hemoglobin 15.7  13.0 - 17.0 g/dL   HCT 43.9  39.0 - 52.0 %   MCV 85.1  78.0 - 100.0 fL   MCH 30.4  26.0 - 34.0 pg   MCHC 35.8  30.0 - 36.0 g/dL   RDW 13.4  11.5 - 15.5 %   Platelets 183  150 - 400 K/uL  BASIC METABOLIC PANEL     Status: Abnormal   Collection Time    05/23/14  8:15 AM      Result Value Ref Range   Sodium 139  135 - 145 mEq/L   Potassium 4.2  3.5 - 5.3 mEq/L   Chloride 103  96 - 112 mEq/L   CO2 27  19 - 32 mEq/L   Glucose, Bld 103 (*) 70 - 99 mg/dL   BUN 21  6 - 23 mg/dL   Creat 1.20  0.50 - 1.35 mg/dL   Calcium 9.5  8.4 - 10.5 mg/dL  HEPATIC FUNCTION PANEL     Status: None   Collection Time    05/23/14  8:15 AM      Result Value Ref  Range   Total Bilirubin 0.8  0.2 - 1.2 mg/dL   Bilirubin, Direct 0.1  0.0 - 0.3 mg/dL   Indirect Bilirubin 0.7  0.2 - 1.2 mg/dL   Alkaline Phosphatase 43  39 - 117 U/L   AST 17  0 - 37 U/L   ALT 22  0 - 53 U/L   Total Protein 7.2  6.0 - 8.3 g/dL   Albumin 4.6  3.5 - 5.2 g/dL  TSH     Status: None   Collection Time    05/23/14  8:15 AM      Result Value Ref Range   TSH 0.798  0.350 - 4.500 uIU/mL  URINALYSIS, ROUTINE W REFLEX MICROSCOPIC     Status: Abnormal   Collection Time    05/23/14  8:15 AM      Result Value Ref Range   Color, Urine YELLOW  YELLOW   APPearance TURBID (*) CLEAR   Specific Gravity, Urine 1.028  1.005 - 1.030   pH 5.0  5.0 - 8.0   Glucose, UA NEG  NEG mg/dL   Bilirubin Urine SMALL (*) NEG   Ketones, ur NEG  NEG mg/dL   Hgb urine dipstick NEG  NEG   Protein, ur NEG  NEG mg/dL   Urobilinogen, UA 1  0.0 - 1.0 mg/dL   Nitrite NEG  NEG   Leukocytes, UA NEG  NEG  LIPID PANEL     Status: Abnormal   Collection Time    05/23/14  8:15 AM      Result Value Ref Range   Cholesterol 174  0 - 200 mg/dL   Comment: ATP III Classification:           < 200        mg/dL        Desirable          200 - 239     mg/dL        Borderline High          >= 240        mg/dL        High         Triglycerides 173 (*) <150 mg/dL   HDL 40  >39 mg/dL   Total CHOL/HDL Ratio 4.4     VLDL 35  0 - 40 mg/dL   LDL Cholesterol  99  0 - 99 mg/dL   Comment:       Total Cholesterol/HDL Ratio:CHD Risk                            Coronary Heart Disease Risk Table                                            Men       Women              1/2 Average Risk              3.4        3.3                  Average Risk              5.0        4.4               2X Average Risk              9.6        7.1               3X Average Risk             23.4       11.0     Use the calculated Patient Ratio above and the CHD Risk table      to determine the patient's CHD Risk.     ATP III Classification (LDL):             < 100        mg/dL         Optimal          100 - 129     mg/dL         Near or Above Optimal          130 - 159     mg/dL         Borderline High          160 - 189     mg/dL         High           > 190        mg/dL         Very High        URINALYSIS, MICROSCOPIC ONLY     Status: None   Collection Time    05/23/14  8:15 AM      Result Value Ref Range   Squamous Epithelial / LPF NONE SEEN  RARE   Crystals NONE SEEN  NONE SEEN   Casts NONE SEEN  NONE SEEN   WBC, UA 0-2  <3 WBC/hpf   RBC / HPF 0-2  <3 RBC/hpf   Bacteria, UA NONE SEEN  RARE    Assessment/Plan: Inguinal pain No palpable hernia.  Will obtain Doppler US to further assess.  Avoid heavy lifting and overexertion.  Tramadol for pain.   Low back pain with radiation Rx Medrol dose pack. Tramadol for pain.  Avoid heavy lifting or overexertion. Topical Aspercreme.  Will obtain x-ray of lumbar spine.

## 2014-07-22 NOTE — Patient Instructions (Signed)
Please go downstairs for imaging. They will be performing an x-ray of your Lower Back and an Ultrasound of your Scrotum to assess for a hernia.  I will call you with your results.  Please take steroid pack as directed.  Use tramadol for moderate pain.  Avoid heavy lifting and overexertion.  Follow-up will be based on your results.

## 2014-07-23 ENCOUNTER — Telehealth: Payer: Self-pay | Admitting: Physician Assistant

## 2014-07-23 DIAGNOSIS — M545 Low back pain, unspecified: Secondary | ICD-10-CM | POA: Insufficient documentation

## 2014-07-23 DIAGNOSIS — R1030 Lower abdominal pain, unspecified: Secondary | ICD-10-CM | POA: Insufficient documentation

## 2014-07-23 DIAGNOSIS — M5440 Lumbago with sciatica, unspecified side: Secondary | ICD-10-CM

## 2014-07-23 NOTE — Assessment & Plan Note (Signed)
No palpable hernia.  Will obtain Doppler US to further assess.  Avoid heavy lifting and overexertion.  Tramadol for pain.

## 2014-07-23 NOTE — Telephone Encounter (Signed)
Pt returning your call, needing results from x-ray and ultrasound

## 2014-07-23 NOTE — Assessment & Plan Note (Signed)
Rx Medrol dose pack. Tramadol for pain.  Avoid heavy lifting or overexertion. Topical Aspercreme.  Will obtain x-ray of lumbar spine.

## 2014-10-08 ENCOUNTER — Ambulatory Visit (INDEPENDENT_AMBULATORY_CARE_PROVIDER_SITE_OTHER): Payer: BC Managed Care – PPO | Admitting: Physician Assistant

## 2014-10-08 ENCOUNTER — Encounter: Payer: Self-pay | Admitting: Physician Assistant

## 2014-10-08 VITALS — BP 119/80 | HR 62 | Temp 98.1°F | Resp 16 | Ht 69.0 in | Wt 194.1 lb

## 2014-10-08 DIAGNOSIS — R109 Unspecified abdominal pain: Secondary | ICD-10-CM

## 2014-10-08 DIAGNOSIS — G8929 Other chronic pain: Secondary | ICD-10-CM | POA: Insufficient documentation

## 2014-10-08 DIAGNOSIS — R1031 Right lower quadrant pain: Secondary | ICD-10-CM

## 2014-10-08 LAB — URINALYSIS, ROUTINE W REFLEX MICROSCOPIC
Bilirubin Urine: NEGATIVE
Hgb urine dipstick: NEGATIVE
Ketones, ur: NEGATIVE
Leukocytes, UA: NEGATIVE
NITRITE: NEGATIVE
PH: 7 (ref 5.0–8.0)
SPECIFIC GRAVITY, URINE: 1.02 (ref 1.000–1.030)
TOTAL PROTEIN, URINE-UPE24: NEGATIVE
Urine Glucose: NEGATIVE
Urobilinogen, UA: 0.2 (ref 0.0–1.0)
WBC UA: NONE SEEN — AB (ref 0–?)

## 2014-10-08 LAB — BASIC METABOLIC PANEL
BUN: 18 mg/dL (ref 6–23)
CHLORIDE: 107 meq/L (ref 96–112)
CO2: 25 mEq/L (ref 19–32)
Calcium: 9.6 mg/dL (ref 8.4–10.5)
Creatinine, Ser: 1.2 mg/dL (ref 0.4–1.5)
GFR: 76.31 mL/min (ref >60.00)
Glucose, Bld: 99 mg/dL (ref 70–99)
Potassium: 3.9 mEq/L (ref 3.5–5.1)
SODIUM: 137 meq/L (ref 135–145)

## 2014-10-08 NOTE — Progress Notes (Signed)
Pre visit review using our clinic review tool, if applicable. No additional management support is needed unless otherwise documented below in the visit note/SLS  

## 2014-10-08 NOTE — Progress Notes (Signed)
Patient presents to clinic today c/o continued intermittent pain in RLQ and R inguinal region.  Symptoms have now been present for ~ 4 months.  Recent examination, [place] and X-ray were unremarkable. Patient denies new or worsening symptoms.  Denies fever, chills, nausea, vomiting or change in weight.  Denies dysuria, urinary urgency, frequency or hematuria.  Denies trauma or injury.  Endorses good bowel output.  When pain occurs, it lasts for a few minutes.  Is not aggravated by motion or relieved by rest.  Past Medical History  Diagnosis Date  . Hypercholesteremia   . Anxiety   . Depression     Current Outpatient Prescriptions on File Prior to Visit  Medication Sig Dispense Refill  . ALPRAZolam (XANAX) 0.5 MG tablet Take up to twice daily as needed for breakthrough anxiety 60 tablet 0  . atorvastatin (LIPITOR) 20 MG tablet TAKE 1 TABLET (20 MG TOTAL) BY MOUTH DAILY. 30 tablet 6  . esomeprazole (NEXIUM) 20 MG packet Take 20 mg by mouth daily before breakfast. 30 each 3  . fenofibrate 160 MG tablet TAKE 1 TABLET (160 MG TOTAL) BY MOUTH DAILY. 30 tablet 6  . sertraline (ZOLOFT) 100 MG tablet TAKE 1/2 (50 mg) TABLET BY MOUTH DAILY.     No current facility-administered medications on file prior to visit.    No Known Allergies  Family History  Problem Relation Age of Onset  . Hypertension Father     Living  . Non-Hodgkin's lymphoma Father   . Diabetes Father   . Healthy Mother     Living  . Hypertension Other     Paternal side  . Heart attack Paternal Grandfather   . Healthy Sister     x1  . Healthy Daughter     x3    History   Social History  . Marital Status: Married    Spouse Name: N/A    Number of Children: N/A  . Years of Education: N/A   Social History Main Topics  . Smoking status: Current Every Day Smoker -- 0.25 packs/day for 5 years  . Smokeless tobacco: Never Used  . Alcohol Use: 3.6 oz/week    6 Cans of beer per week  . Drug Use: No  . Sexual Activity:  Yes    Birth Control/ Protection: None   Other Topics Concern  . None   Social History Narrative    Review of Systems - See HPI.  All other ROS are negative.  BP 119/80 mmHg  Pulse 62  Temp(Src) 98.1 F (36.7 C) (Oral)  Resp 16  Ht 5\' 9"  (1.753 m)  Wt 194 lb 2 oz (88.055 kg)  BMI 28.65 kg/m2  SpO2 98%  Physical Exam  Constitutional: He is oriented to person, place, and time and well-developed, well-nourished, and in no distress.  HENT:  Head: Normocephalic and atraumatic.  Eyes: Conjunctivae are normal.  Cardiovascular: Normal rate, regular rhythm, normal heart sounds and intact distal pulses.   Pulmonary/Chest: Effort normal and breath sounds normal. No respiratory distress. He has no wheezes.  Abdominal: Soft. Bowel sounds are normal. He exhibits no distension and no mass. There is no tenderness. There is no rebound and no guarding.  Genitourinary: Testes/scrotum normal and penis normal.  Neurological: He is alert and oriented to person, place, and time.  Skin: Skin is warm and dry. No rash noted.  Psychiatric: Affect normal.  Vitals reviewed.   No results found for this or any previous visit (from the past 2160  hour(s)).  Assessment/Plan: Chronic pain of right inguinal region Unclear etiology.  Workup thus far unremarkable.  Examination within normal limits. Will repeat UA and obtain BMP.  Will proceed with CT.  Avoid heavy lifting or overexertion.

## 2014-10-08 NOTE — Patient Instructions (Signed)
Please stop by the lab.  I will call you with those results.  You will also be contacted to schedule your CT scan.  The labs will have to be completed before that can be scheduled.  Use ibuprofen if needed for the pain when it occurs.  Avoid heavy lifting.  Stay well hydrated. We will know more once the CT scan is obtained.

## 2014-10-08 NOTE — Assessment & Plan Note (Signed)
Unclear etiology.  Workup thus far unremarkable.  Examination within normal limits. Will repeat UA and obtain BMP.  Will proceed with CT.  Avoid heavy lifting or overexertion.

## 2014-10-10 ENCOUNTER — Encounter (HOSPITAL_BASED_OUTPATIENT_CLINIC_OR_DEPARTMENT_OTHER): Payer: Self-pay

## 2014-10-10 ENCOUNTER — Ambulatory Visit (HOSPITAL_BASED_OUTPATIENT_CLINIC_OR_DEPARTMENT_OTHER)
Admission: RE | Admit: 2014-10-10 | Discharge: 2014-10-10 | Disposition: A | Discharge status: Home / Self Care | Payer: BC Managed Care – PPO | Source: Ambulatory Visit | Attending: Physician Assistant | Admitting: Physician Assistant

## 2014-10-10 DIAGNOSIS — G8929 Other chronic pain: Secondary | ICD-10-CM | POA: Diagnosis not present

## 2014-10-10 DIAGNOSIS — R109 Unspecified abdominal pain: Secondary | ICD-10-CM | POA: Diagnosis not present

## 2014-10-10 DIAGNOSIS — R1031 Right lower quadrant pain: Secondary | ICD-10-CM | POA: Insufficient documentation

## 2014-10-10 MED ORDER — IOHEXOL 300 MG/ML  SOLN
100.0000 mL | Freq: Once | INTRAMUSCULAR | Status: AC | PRN
  Administered 2014-10-10: 100 mL via INTRAVENOUS

## 2014-10-11 ENCOUNTER — Other Ambulatory Visit: Payer: Self-pay | Admitting: Physician Assistant

## 2014-10-11 DIAGNOSIS — K4090 Unilateral inguinal hernia, without obstruction or gangrene, not specified as recurrent: Secondary | ICD-10-CM

## 2014-12-25 ENCOUNTER — Ambulatory Visit: Admit: 2014-12-25 | Payer: Self-pay | Admitting: General Surgery

## 2014-12-25 NOTE — Progress Notes (Signed)
Please put orders in Epic surgery 01-06-15 pre op 12-31-14 Thanks

## 2014-12-27 ENCOUNTER — Ambulatory Visit (INDEPENDENT_AMBULATORY_CARE_PROVIDER_SITE_OTHER): Payer: Self-pay | Admitting: General Surgery

## 2014-12-27 NOTE — H&P (Signed)
Jerry Lopez 10/31/2014 10:17 AM Location: [facility] Patient #: [id number] DOB: 01-Dec-1980 Married / Language: Jerry Lopez / Race: White Male  History of Present Illness Jerry Lopez M. Seward Coran MD; 10/31/2014 3:25 PM) Patient words: Intial visit RT Inguinal Hernia.  The patient is a 34 year old male who presents with an inguinal hernia. He is referred by Jerry Europe, PA-C for evaluation of a possible right inguinal hernia. He reports a several month history of intermittent right groin pain. 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 At first the discomfort was infrequent but lately it is becoming more bothersome and more frequent. It was occasionally burn. It will bother him if he runs. He has not noticed a bulge. He describes it more as a dull pain. He denies any dysuria. He denies any fever, chills, nausea, vomiting, diarrhea or constipation. He does smoke about a pack a day. He has seen the PA at the primary care physician's office several times. A urinalysis and blood work were normal. He underwent a CT scan which was unremarkable except for possible dilated right inguinal ring and was referred here for evaluation   Other Problems Jerry Curry, MD; 10/31/2014 3:27 PM) Anxiety Disorder Depression Gastroesophageal Reflux Disease Hypercholesterolemia Inguinal Hernia RIGHT INGUINAL HERNIA (550.90  K40.90)  Past Surgical History Jerry Holts, LPN; 10/30/9676 93:81 AM) Vasectomy  Diagnostic Studies History Jerry Holts, LPN; 0/10/7508 25:85 AM) Colonoscopy never  Allergies Jerry Holts, LPN; 12/01/7822 23:53 AM) No Known Drug Allergies01/04/2015  Medication History Jerry Curry, MD; 10/31/2014 3:27 PM) ALPRAZolam (0.5MG  Tablet, Oral prn) Active. Atorvastatin Calcium (20MG  Tablet, Oral) Active. Fenofibrate (160MG  Tablet, Oral) Active. NexIUM (20MG  Packet, Oral) Active. Zoloft (100MG  Tablet, Oral)  Active. Flomax (0.4MG  Capsule, 1 (one) Capsule Oral daily, Taken starting 10/31/2014) Active.  Social History Jerry Holts, LPN; 03/25/4430 54:00 AM) Alcohol use Occasional alcohol use. Caffeine use Carbonated beverages. No drug use Tobacco use Current every day smoker.  Family History Jerry Holts, LPN; 05/30/7618 50:93 AM) Cancer Father. Hypertension Father.  Review of Systems Jerry Holts LPN; 11/30/7122 58:09 AM) General Not Present- Appetite Loss, Chills, Fatigue, Fever, Night Sweats, Weight Gain and Weight Loss. Skin Not Present- Change in Wart/Mole, Dryness, Hives, Jaundice, New Lesions, Non-Healing Wounds, Rash and Ulcer. HEENT Not Present- Earache, Hearing Loss, Hoarseness, Nose Bleed, Oral Ulcers, Ringing in the Ears, Seasonal Allergies, Sinus Pain, Sore Throat, Visual Disturbances, Wears glasses/contact lenses and Yellow Eyes. Respiratory Not Present- Bloody sputum, Chronic Cough, Difficulty Breathing, Snoring and Wheezing. Breast Not Present- Breast Mass, Breast Pain, Nipple Discharge and Skin Changes. Cardiovascular Not Present- Chest Pain, Difficulty Breathing Lying Down, Leg Cramps, Palpitations, Rapid Heart Rate, Shortness of Breath and Swelling of Extremities. Gastrointestinal Not Present- Abdominal Pain, Bloating, Bloody Stool, Change in Bowel Habits, Chronic diarrhea, Constipation, Difficulty Swallowing, Excessive gas, Gets full quickly at meals, Hemorrhoids, Indigestion, Nausea, Rectal Pain and Vomiting. Male Genitourinary Not Present- Blood in Urine, Change in Urinary Stream, Frequency, Impotence, Nocturia, Painful Urination, Urgency and Urine Leakage. Musculoskeletal Not Present- Back Pain, Joint Pain, Joint Stiffness, Muscle Pain, Muscle Weakness and Swelling of Extremities. Neurological Not Present- Decreased Memory, Fainting, Headaches, Numbness, Seizures, Tingling, Tremor, Trouble walking and Weakness. Psychiatric Present- Anxiety. Not Present- Bipolar,  Change in Sleep Pattern, Depression, Fearful and Frequent crying. Hematology Not Present- Easy Bruising, Excessive bleeding, Gland problems, HIV and Persistent Infections.   Vitals Jerry Holts LPN; 07/02/3381 50:53 AM) 10/31/2014 10:19 AM Weight: 196.5 lb Height: 70in  Body Surface Area: 2.1 m Body Mass Index: 28.19 kg/m Temp.: 98.24F(Temporal)  Pulse: 78 (Regular)  Resp.: 20 (Unlabored)  BP: 138/86 (Sitting, Left Arm, Standard)    Physical Exam Jerry Lopez M. Jerry Finch MD; 10/31/2014 3:23 PM) General Mental Status-Alert. General Appearance-Consistent with stated age. Hydration-Well hydrated. Voice-Normal.  Head and Neck Head-normocephalic, atraumatic with no lesions or palpable masses. Trachea-midline. Thyroid Gland Characteristics - normal size and consistency.  Eye Eyeball - Bilateral-Extraocular movements intact. Sclera/Conjunctiva - Bilateral-No scleral icterus.  Chest and Lung Exam Chest and lung exam reveals -quiet, even and easy respiratory effort with no use of accessory muscles and on auscultation, normal breath sounds, no adventitious sounds and normal vocal resonance. Inspection Chest Wall - Normal. Back - normal.  Breast - Did not examine.  Cardiovascular Cardiovascular examination reveals -normal heart sounds, regular rate and rhythm with no murmurs and normal pedal pulses bilaterally.  Abdomen Inspection Inspection of the abdomen reveals - No Hernias. Skin - Scar - no surgical scars. Palpation/Percussion Palpation and Percussion of the abdomen reveal - Soft, Non Tender, No Rebound tenderness, No Rigidity (guarding) and No hepatosplenomegaly. Auscultation Auscultation of the abdomen reveals - Bowel sounds normal.  Male Genitourinary Note: both testicles down; normal penis; examined supine and standing. no obvious bulge. no LIH. +enlarged ring on right with some Tenderness; Has impulse in rt canal with valsalva   Peripheral  Vascular Upper Extremity Palpation - Pulses bilaterally normal.  Neurologic Neurologic evaluation reveals -alert and oriented x 3 with no impairment of recent or remote memory. Mental Status-Normal.  Neuropsychiatric The patient's mood and affect are described as -normal. Judgment and Insight-insight is appropriate concerning matters relevant to self.  Musculoskeletal Normal Exam - Left-Upper Extremity Strength Normal and Lower Extremity Strength Normal. Normal Exam - Right-Upper Extremity Strength Normal and Lower Extremity Strength Normal.  Lymphatic Head & Neck  General Head & Neck Lymphatics: Bilateral - Description - Normal. Axillary - Did not examine. Femoral & Inguinal - Did not examine.    Assessment & Plan Jerry Lopez M. Llewelyn Sheaffer MD; 10/31/2014 3:27 PM) RIGHT INGUINAL HERNIA (550.90  K40.90) Impression: He does not have a large bulky hernia; however, he does have an enlarged inguinal ring on exam. There is an impulse in the ring on exam; moreover, he does have an atypical appearance to the right groin on CT scan. Therefore I think this all points 2 and is consistent with an inguinal hernia. We discussed that with preoperative sharp burning pain that makes him slightly higher risk for chronic groin pain after surgery Current Plans  We discussed the etiology of inguinal hernias. We discussed the signs & symptoms of incarceration & strangulation. We discussed non-operative and operative management. We also discussed open and laparoscopic approaches.  I described laparoscopic inguinal hernia repair procedure in detail. The patient was given educational material. We discussed the risks and benefits including but not limited to bleeding, infection, chronic inguinal pain, nerve entrapment, hernia recurrence, mesh complications, hematoma formation, urinary retention, injury to the testicles, numbness in the groin, blood clots, injury to the surrounding structures, and anesthesia  risk. We also discussed the typical post operative recovery course, including no heavy lifting for 4-6 weeks. I explained that the likelihood of improvement of their symptoms is good  Our office will contact you to schedule surgery in near future. Please call me if you have not heard from my office within 1 week  pick up flomax and starting taking 1 week before surgery including morning of surgery Started Flomax  0.4MG , 1 (one) Capsule daily, #12, 12 days starting 10/31/2014, No Refill.  Leighton Ruff. Redmond Pulling, MD, FACS General, Bariatric, & Minimally Invasive Surgery [facility], [place]

## 2014-12-30 ENCOUNTER — Other Ambulatory Visit (HOSPITAL_COMMUNITY): Payer: Self-pay | Admitting: *Deleted

## 2014-12-30 NOTE — Patient Instructions (Addendum)
Jerry Lopez  12/30/2014   Your procedure is scheduled on: Monday 01/06/2015  Report to [facility] Main  Entrance and follow signs to               [facility] [place].  Call this number if you have problems the morning of surgery [phone]   Remember:  Do not eat food or drink liquids :After Midnight.     Take these medicines the morning of surgery with A SIP OF WATER: Zoloft, Nexium                               You may not have any metal on your body including hair pins and              piercings  Do not wear jewelry, make-up, lotions, powders or perfumes.             Do not wear nail polish.  Do not shave  48 hours prior to surgery.              Men may shave face and neck.   Do not bring valuables to the hospital. [place].  Contacts, dentures or bridgework may not be worn into surgery.  Leave suitcase in the car. After surgery it may be brought to your room.     Patients discharged the day of surgery will not be allowed to drive home.  Name and phone number of your driver:  Special Instructions: N/A              Please read over the following fact sheets you were given: _____________________________________________________________________             [facility] - Preparing for Surgery Before surgery, you can play an important role.  Because skin is not sterile, your skin needs to be as free of germs as possible.  You can reduce the number of germs on your skin by washing with CHG (chlorahexidine gluconate) soap before surgery.  CHG is an antiseptic cleaner which kills germs and bonds with the skin to continue killing germs even after washing. Please DO NOT use if you have an allergy to CHG or antibacterial soaps.  If your skin becomes reddened/irritated stop using the CHG and inform your nurse when you arrive at Short Stay. Do not shave (including legs and underarms) for at least 48  hours prior to the first CHG shower.  You may shave your face/neck. Please follow these instructions carefully:  1.  Shower with CHG Soap the night before surgery and the  morning of Surgery.  2.  If you choose to wash your hair, wash your hair first as usual with your  normal  shampoo.  3.  After you shampoo, rinse your hair and body thoroughly to remove the  shampoo.                           4.  Use CHG as you would any other liquid soap.  You can apply chg directly  to the skin and wash  Gently with a scrungie or clean washcloth.  5.  Apply the CHG Soap to your body ONLY FROM THE NECK DOWN.   Do not use on face/ open                           Wound or open sores. Avoid contact with eyes, ears mouth and genitals (private parts).                       Wash face,  Genitals (private parts) with your normal soap.             6.  Wash thoroughly, paying special attention to the area where your surgery  will be performed.  7.  Thoroughly rinse your body with warm water from the neck down.  8.  DO NOT shower/wash with your normal soap after using and rinsing off  the CHG Soap.                9.  Pat yourself dry with a clean towel.            10.  Wear clean pajamas.            11.  Place clean sheets on your bed the night of your first shower and do not  sleep with pets. Day of Surgery : Do not apply any lotions/deodorants the morning of surgery.  Please wear clean clothes to the hospital/surgery center.  FAILURE TO FOLLOW THESE INSTRUCTIONS MAY RESULT IN THE CANCELLATION OF YOUR SURGERY PATIENT SIGNATURE_________________________________  NURSE SIGNATURE__________________________________  ________________________________________________________________________

## 2014-12-31 ENCOUNTER — Encounter (HOSPITAL_COMMUNITY): Payer: Self-pay

## 2014-12-31 ENCOUNTER — Encounter (HOSPITAL_COMMUNITY)
Admission: RE | Admit: 2014-12-31 | Discharge: 2014-12-31 | Disposition: A | Discharge status: Home / Self Care | Payer: BLUE CROSS/BLUE SHIELD | Source: Ambulatory Visit | Attending: General Surgery | Admitting: General Surgery

## 2014-12-31 DIAGNOSIS — K4090 Unilateral inguinal hernia, without obstruction or gangrene, not specified as recurrent: Secondary | ICD-10-CM | POA: Diagnosis not present

## 2014-12-31 DIAGNOSIS — Z01818 Encounter for other preprocedural examination: Secondary | ICD-10-CM | POA: Insufficient documentation

## 2014-12-31 HISTORY — DX: Gastro-esophageal reflux disease without esophagitis: K21.9

## 2014-12-31 HISTORY — DX: Unspecified osteoarthritis, unspecified site: M19.90

## 2014-12-31 HISTORY — DX: Chest pain, unspecified: R07.9

## 2014-12-31 LAB — BASIC METABOLIC PANEL
Anion gap: 6 (ref 5–15)
BUN: 23 mg/dL (ref 6–23)
CO2: 26 mmol/L (ref 19–32)
Calcium: 9.3 mg/dL (ref 8.4–10.5)
Chloride: 109 mmol/L (ref 96–112)
Creatinine, Ser: 1.17 mg/dL (ref 0.50–1.35)
GFR, EST NON AFRICAN AMERICAN: 80 mL/min — AB (ref >90)
Glucose, Bld: 109 mg/dL — ABNORMAL HIGH (ref 70–99)
POTASSIUM: 4.9 mmol/L (ref 3.5–5.1)
SODIUM: 141 mmol/L (ref 135–145)

## 2014-12-31 LAB — CBC
HEMATOCRIT: 40.6 % (ref 39.0–52.0)
Hemoglobin: 14.1 g/dL (ref 13.0–17.0)
MCH: 30.2 pg (ref 26.0–34.0)
MCHC: 34.7 g/dL (ref 30.0–36.0)
MCV: 86.9 fL (ref 78.0–100.0)
Platelets: 162 10*3/uL (ref 150–400)
RBC: 4.67 MIL/uL (ref 4.22–5.81)
RDW: 12.7 % (ref 11.5–15.5)
WBC: 6.3 10*3/uL (ref 4.0–10.5)

## 2015-01-06 ENCOUNTER — Ambulatory Visit (HOSPITAL_COMMUNITY): Payer: BLUE CROSS/BLUE SHIELD | Admitting: Anesthesiology

## 2015-01-06 ENCOUNTER — Ambulatory Visit (HOSPITAL_COMMUNITY)
Admission: RE | Admit: 2015-01-06 | Discharge: 2015-01-06 | Disposition: A | Discharge status: Home / Self Care | Payer: BLUE CROSS/BLUE SHIELD | Source: Ambulatory Visit | Attending: General Surgery | Admitting: General Surgery

## 2015-01-06 ENCOUNTER — Encounter (HOSPITAL_COMMUNITY): Payer: Self-pay

## 2015-01-06 ENCOUNTER — Encounter (HOSPITAL_COMMUNITY)
Admission: RE | Disposition: A | Discharge status: Home / Self Care | Payer: Self-pay | Source: Ambulatory Visit | Attending: General Surgery

## 2015-01-06 DIAGNOSIS — Z9852 Vasectomy status: Secondary | ICD-10-CM | POA: Diagnosis not present

## 2015-01-06 DIAGNOSIS — G43909 Migraine, unspecified, not intractable, without status migrainosus: Secondary | ICD-10-CM | POA: Diagnosis not present

## 2015-01-06 DIAGNOSIS — K219 Gastro-esophageal reflux disease without esophagitis: Secondary | ICD-10-CM | POA: Insufficient documentation

## 2015-01-06 DIAGNOSIS — F329 Major depressive disorder, single episode, unspecified: Secondary | ICD-10-CM | POA: Insufficient documentation

## 2015-01-06 DIAGNOSIS — F419 Anxiety disorder, unspecified: Secondary | ICD-10-CM | POA: Insufficient documentation

## 2015-01-06 DIAGNOSIS — F17210 Nicotine dependence, cigarettes, uncomplicated: Secondary | ICD-10-CM | POA: Insufficient documentation

## 2015-01-06 DIAGNOSIS — Z79899 Other long term (current) drug therapy: Secondary | ICD-10-CM | POA: Diagnosis not present

## 2015-01-06 DIAGNOSIS — D176 Benign lipomatous neoplasm of spermatic cord: Secondary | ICD-10-CM | POA: Diagnosis not present

## 2015-01-06 DIAGNOSIS — M1990 Unspecified osteoarthritis, unspecified site: Secondary | ICD-10-CM | POA: Diagnosis not present

## 2015-01-06 DIAGNOSIS — K4090 Unilateral inguinal hernia, without obstruction or gangrene, not specified as recurrent: Secondary | ICD-10-CM | POA: Insufficient documentation

## 2015-01-06 HISTORY — PX: INGUINAL HERNIA REPAIR: SHX194

## 2015-01-06 HISTORY — PX: INSERTION OF MESH: SHX5868

## 2015-01-06 SURGERY — REPAIR, HERNIA, INGUINAL, LAPAROSCOPIC
Anesthesia: General | Site: Abdomen | Laterality: Right

## 2015-01-06 SURGERY — REPAIR, HERNIA, INGUINAL, LAPAROSCOPIC
Anesthesia: General | Laterality: Right

## 2015-01-06 MED ORDER — LIDOCAINE HCL (CARDIAC) 20 MG/ML IV SOLN
INTRAVENOUS | Status: AC
  Filled 2015-01-06: qty 5

## 2015-01-06 MED ORDER — CEFAZOLIN SODIUM-DEXTROSE 2-3 GM-% IV SOLR
INTRAVENOUS | Status: AC
  Filled 2015-01-06: qty 50

## 2015-01-06 MED ORDER — ROCURONIUM BROMIDE 100 MG/10ML IV SOLN
INTRAVENOUS | Status: AC
  Filled 2015-01-06: qty 1

## 2015-01-06 MED ORDER — HYDROMORPHONE HCL 1 MG/ML IJ SOLN: 0.2500 mg | INTRAMUSCULAR | Status: DC | PRN

## 2015-01-06 MED ORDER — BUPIVACAINE-EPINEPHRINE (PF) 0.25% -1:200000 IJ SOLN
INTRAMUSCULAR | Status: AC
  Filled 2015-01-06: qty 30

## 2015-01-06 MED ORDER — LIDOCAINE HCL (CARDIAC) 20 MG/ML IV SOLN
INTRAVENOUS | Status: DC | PRN
  Administered 2015-01-06: 100 mg via INTRAVENOUS

## 2015-01-06 MED ORDER — KETOROLAC TROMETHAMINE 30 MG/ML IJ SOLN
INTRAMUSCULAR | Status: AC
  Filled 2015-01-06: qty 1

## 2015-01-06 MED ORDER — ACETAMINOPHEN 325 MG PO TABS: 650.0000 mg | ORAL_TABLET | ORAL | Status: DC | PRN

## 2015-01-06 MED ORDER — FENTANYL CITRATE 0.05 MG/ML IJ SOLN
INTRAMUSCULAR | Status: DC | PRN
  Administered 2015-01-06: 50 ug via INTRAVENOUS
  Administered 2015-01-06: 100 ug via INTRAVENOUS
  Administered 2015-01-06 (×2): 50 ug via INTRAVENOUS

## 2015-01-06 MED ORDER — MORPHINE SULFATE 10 MG/ML IJ SOLN: 1.0000 mg | INTRAMUSCULAR | Status: DC | PRN

## 2015-01-06 MED ORDER — ROCURONIUM BROMIDE 100 MG/10ML IV SOLN
INTRAVENOUS | Status: DC | PRN
  Administered 2015-01-06: 5 mg via INTRAVENOUS
  Administered 2015-01-06: 40 mg via INTRAVENOUS

## 2015-01-06 MED ORDER — PROPOFOL 10 MG/ML IV BOLUS
INTRAVENOUS | Status: AC
  Filled 2015-01-06: qty 20

## 2015-01-06 MED ORDER — SODIUM CHLORIDE 0.9 % IJ SOLN: 3.0000 mL | Freq: Two times a day (BID) | INTRAMUSCULAR | Status: DC

## 2015-01-06 MED ORDER — LACTATED RINGERS IV SOLN
INTRAVENOUS | Status: DC
  Administered 2015-01-06: 1000 mL via INTRAVENOUS

## 2015-01-06 MED ORDER — FENTANYL CITRATE 0.05 MG/ML IJ SOLN
INTRAMUSCULAR | Status: AC
  Filled 2015-01-06: qty 5

## 2015-01-06 MED ORDER — NEOSTIGMINE METHYLSULFATE 10 MG/10ML IV SOLN
INTRAVENOUS | Status: AC
  Filled 2015-01-06: qty 1

## 2015-01-06 MED ORDER — DEXAMETHASONE SODIUM PHOSPHATE 10 MG/ML IJ SOLN
INTRAMUSCULAR | Status: DC | PRN
  Administered 2015-01-06: 10 mg via INTRAVENOUS

## 2015-01-06 MED ORDER — OXYCODONE HCL 5 MG PO TABS: 5.0000 mg | ORAL_TABLET | ORAL | Status: DC | PRN

## 2015-01-06 MED ORDER — CEFAZOLIN SODIUM-DEXTROSE 2-3 GM-% IV SOLR
2.0000 g | Freq: Once | INTRAVENOUS | Status: AC
  Administered 2015-01-06: 2 g via INTRAVENOUS

## 2015-01-06 MED ORDER — SUCCINYLCHOLINE CHLORIDE 20 MG/ML IJ SOLN
INTRAMUSCULAR | Status: DC | PRN
  Administered 2015-01-06: 100 mg via INTRAVENOUS

## 2015-01-06 MED ORDER — ACETAMINOPHEN 650 MG RE SUPP
650.0000 mg | RECTAL | Status: DC | PRN
  Filled 2015-01-06: qty 1

## 2015-01-06 MED ORDER — ONDANSETRON HCL 4 MG/2ML IJ SOLN
INTRAMUSCULAR | Status: DC | PRN
  Administered 2015-01-06: 4 mg via INTRAVENOUS

## 2015-01-06 MED ORDER — GLYCOPYRROLATE 0.2 MG/ML IJ SOLN
INTRAMUSCULAR | Status: AC
  Filled 2015-01-06: qty 3

## 2015-01-06 MED ORDER — BUPIVACAINE-EPINEPHRINE 0.25% -1:200000 IJ SOLN
INTRAMUSCULAR | Status: DC | PRN
  Administered 2015-01-06: 15 mL

## 2015-01-06 MED ORDER — NEOSTIGMINE METHYLSULFATE 10 MG/10ML IV SOLN
INTRAVENOUS | Status: DC | PRN
  Administered 2015-01-06: 5 mg via INTRAVENOUS

## 2015-01-06 MED ORDER — OXYCODONE HCL 5 MG PO TABS: 5.0000 mg | ORAL_TABLET | Freq: Once | ORAL | Status: DC | PRN

## 2015-01-06 MED ORDER — OXYCODONE HCL 5 MG/5ML PO SOLN
5.0000 mg | Freq: Once | ORAL | Status: DC | PRN
  Filled 2015-01-06: qty 5

## 2015-01-06 MED ORDER — KETOROLAC TROMETHAMINE 15 MG/ML IJ SOLN
INTRAMUSCULAR | Status: DC | PRN
  Administered 2015-01-06: 30 mg via INTRAVENOUS

## 2015-01-06 MED ORDER — LACTATED RINGERS IV SOLN
INTRAVENOUS | Status: DC | PRN
  Administered 2015-01-06: 1000 mL

## 2015-01-06 MED ORDER — PROPOFOL 10 MG/ML IV BOLUS
INTRAVENOUS | Status: DC | PRN
  Administered 2015-01-06: 200 mg via INTRAVENOUS

## 2015-01-06 MED ORDER — BUPIVACAINE LIPOSOME 1.3 % IJ SUSP
20.0000 mL | Freq: Once | INTRAMUSCULAR | Status: AC
  Administered 2015-01-06: 20 mL
  Filled 2015-01-06: qty 20

## 2015-01-06 MED ORDER — SODIUM CHLORIDE 0.9 % IJ SOLN: 3.0000 mL | INTRAMUSCULAR | Status: DC | PRN

## 2015-01-06 MED ORDER — SODIUM CHLORIDE 0.9 % IJ SOLN
INTRAMUSCULAR | Status: AC
  Filled 2015-01-06: qty 10

## 2015-01-06 MED ORDER — MIDAZOLAM HCL 2 MG/2ML IJ SOLN
INTRAMUSCULAR | Status: AC
  Filled 2015-01-06: qty 2

## 2015-01-06 MED ORDER — MIDAZOLAM HCL 5 MG/5ML IJ SOLN
INTRAMUSCULAR | Status: DC | PRN
  Administered 2015-01-06: 2 mg via INTRAVENOUS

## 2015-01-06 MED ORDER — PROMETHAZINE HCL 25 MG/ML IJ SOLN: 6.2500 mg | INTRAMUSCULAR | Status: DC | PRN

## 2015-01-06 MED ORDER — SODIUM CHLORIDE 0.9 % IV SOLN: 250.0000 mL | INTRAVENOUS | Status: DC | PRN

## 2015-01-06 MED ORDER — GLYCOPYRROLATE 0.2 MG/ML IJ SOLN
INTRAMUSCULAR | Status: DC | PRN
  Administered 2015-01-06: 0.6 mg via INTRAVENOUS

## 2015-01-06 SURGICAL SUPPLY — 32 items
BANDAGE ADH SHEER 1  50/CT (GAUZE/BANDAGES/DRESSINGS) IMPLANT
BENZOIN TINCTURE PRP APPL 2/3 (GAUZE/BANDAGES/DRESSINGS) IMPLANT
DECANTER SPIKE VIAL GLASS SM (MISCELLANEOUS) ×3 IMPLANT
DEVICE SECURE STRAP 25 ABSORB (INSTRUMENTS) ×3 IMPLANT
DRAPE LAPAROSCOPIC ABDOMINAL (DRAPES) ×3 IMPLANT
DRSG TEGADERM 2-3/8X2-3/4 SM (GAUZE/BANDAGES/DRESSINGS) IMPLANT
DRSG TEGADERM 4X4.75 (GAUZE/BANDAGES/DRESSINGS) IMPLANT
ELECT REM PT RETURN 9FT ADLT (ELECTROSURGICAL) ×3
ELECTRODE REM PT RTRN 9FT ADLT (ELECTROSURGICAL) ×2 IMPLANT
GLOVE BIO SURGEON STRL SZ7.5 (GLOVE) ×3 IMPLANT
GLOVE BIOGEL M STRL SZ7.5 (GLOVE) IMPLANT
GLOVE INDICATOR 8.0 STRL GRN (GLOVE) ×3 IMPLANT
GOWN STRL REUS W/TWL XL LVL3 (GOWN DISPOSABLE) ×9 IMPLANT
KIT BASIN OR (CUSTOM PROCEDURE TRAY) ×3 IMPLANT
MESH ULTRAPRO 3X6 7.6X15CM (Mesh General) ×3 IMPLANT
NS IRRIG 1000ML POUR BTL (IV SOLUTION) ×3 IMPLANT
RELOAD STAPLE HERNIA 4.0 BLUE (INSTRUMENTS) IMPLANT
RELOAD STAPLE HERNIA 4.8 BLK (STAPLE) IMPLANT
SCALPEL HARMONIC ACE (MISCELLANEOUS) IMPLANT
SCISSORS LAP 5X35 DISP (ENDOMECHANICALS) IMPLANT
SET IRRIG TUBING LAPAROSCOPIC (IRRIGATION / IRRIGATOR) IMPLANT
SOLUTION ANTI FOG 6CC (MISCELLANEOUS) ×3 IMPLANT
STAPLER HERNIA 12 8.5 360D (INSTRUMENTS) ×3 IMPLANT
STRIP CLOSURE SKIN 1/2X4 (GAUZE/BANDAGES/DRESSINGS) IMPLANT
SUT MNCRL AB 4-0 PS2 18 (SUTURE) ×3 IMPLANT
TOWEL OR 17X26 10 PK STRL BLUE (TOWEL DISPOSABLE) ×3 IMPLANT
TOWEL OR NON WOVEN STRL DISP B (DISPOSABLE) ×3 IMPLANT
TRAY LAPAROSCOPIC (CUSTOM PROCEDURE TRAY) ×3 IMPLANT
TROCAR BLADELESS OPT 5 75 (ENDOMECHANICALS) ×3 IMPLANT
TROCAR SLEEVE XCEL 5X75 (ENDOMECHANICALS) ×3 IMPLANT
TROCAR XCEL BLUNT TIP 100MML (ENDOMECHANICALS) ×3 IMPLANT
TUBING INSUFFLATION 10FT LAP (TUBING) ×3 IMPLANT

## 2015-01-06 NOTE — Anesthesia Preprocedure Evaluation (Signed)
Anesthesia Evaluation    Reviewed: Allergy & Precautions, NPO status , Patient's Chart, lab work & pertinent test results  History of Anesthesia Complications Negative for: history of anesthetic complications  Airway        Dental   Pulmonary Current Smoker,          Cardiovascular negative cardio ROS      Neuro/Psych  Headaches, PSYCHIATRIC DISORDERS Anxiety    GI/Hepatic Neg liver ROS, GERD-  ,  Endo/Other  negative endocrine ROS  Renal/GU negative Renal ROS     Musculoskeletal  (+) Arthritis -,   Abdominal   Peds  Hematology   Anesthesia Other Findings   Reproductive/Obstetrics                             Anesthesia Physical Anesthesia Plan  ASA: II  Anesthesia Plan: General   Post-op Pain Management:    Induction: Intravenous  Airway Management Planned: Oral ETT  Additional Equipment:   Intra-op Plan:   Post-operative Plan: Extubation in OR  Informed Consent:   Plan Discussed with:   Anesthesia Plan Comments:         Anesthesia Quick Evaluation

## 2015-01-06 NOTE — H&P (View-Only) (Signed)
Jerry Lopez 10/31/2014 10:17 AM Location: [facility] Patient #: [id number] DOB: April 19, 1981 Married / Language: Cleophus Molt / Race: White Male  History of Present Illness Randall Hiss M. Lamere Lightner MD; 10/31/2014 3:25 PM) Patient words: Intial visit RT Inguinal Hernia.  The patient is a 34 year old male who presents with an inguinal hernia. He is referred by Dan Europe, PA-C for evaluation of a possible right inguinal hernia. He reports a several month history of intermittent right groin pain. 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 At first the discomfort was infrequent but lately it is becoming more bothersome and more frequent. It was occasionally burn. It will bother him if he runs. He has not noticed a bulge. He describes it more as a dull pain. He denies any dysuria. He denies any fever, chills, nausea, vomiting, diarrhea or constipation. He does smoke about a pack a day. He has seen the PA at the primary care physician's office several times. A urinalysis and blood work were normal. He underwent a CT scan which was unremarkable except for possible dilated right inguinal ring and was referred here for evaluation   Other Problems Gayland Curry, MD; 10/31/2014 3:27 PM) Anxiety Disorder Depression Gastroesophageal Reflux Disease Hypercholesterolemia Inguinal Hernia RIGHT INGUINAL HERNIA (550.90  K40.90)  Past Surgical History Festus Holts, LPN; 04/28/9162 84:66 AM) Vasectomy  Diagnostic Studies History Festus Holts, LPN; 03/02/9356 01:77 AM) Colonoscopy never  Allergies Festus Holts, LPN; 06/27/9029 09:23 AM) No Known Drug Allergies01/04/2015  Medication History Gayland Curry, MD; 10/31/2014 3:27 PM) ALPRAZolam (0.5MG  Tablet, Oral prn) Active. Atorvastatin Calcium (20MG  Tablet, Oral) Active. Fenofibrate (160MG  Tablet, Oral) Active. NexIUM (20MG  Packet, Oral) Active. Zoloft (100MG  Tablet, Oral)  Active. Flomax (0.4MG  Capsule, 1 (one) Capsule Oral daily, Taken starting 10/31/2014) Active.  Social History Festus Holts, LPN; 3/0/0762 26:33 AM) Alcohol use Occasional alcohol use. Caffeine use Carbonated beverages. No drug use Tobacco use Current every day smoker.  Family History Festus Holts, LPN; 12/27/4560 56:38 AM) Cancer Father. Hypertension Father.  Review of Systems Festus Holts LPN; 06/27/7341 87:68 AM) General Not Present- Appetite Loss, Chills, Fatigue, Fever, Night Sweats, Weight Gain and Weight Loss. Skin Not Present- Change in Wart/Mole, Dryness, Hives, Jaundice, New Lesions, Non-Healing Wounds, Rash and Ulcer. HEENT Not Present- Earache, Hearing Loss, Hoarseness, Nose Bleed, Oral Ulcers, Ringing in the Ears, Seasonal Allergies, Sinus Pain, Sore Throat, Visual Disturbances, Wears glasses/contact lenses and Yellow Eyes. Respiratory Not Present- Bloody sputum, Chronic Cough, Difficulty Breathing, Snoring and Wheezing. Breast Not Present- Breast Mass, Breast Pain, Nipple Discharge and Skin Changes. Cardiovascular Not Present- Chest Pain, Difficulty Breathing Lying Down, Leg Cramps, Palpitations, Rapid Heart Rate, Shortness of Breath and Swelling of Extremities. Gastrointestinal Not Present- Abdominal Pain, Bloating, Bloody Stool, Change in Bowel Habits, Chronic diarrhea, Constipation, Difficulty Swallowing, Excessive gas, Gets full quickly at meals, Hemorrhoids, Indigestion, Nausea, Rectal Pain and Vomiting. Male Genitourinary Not Present- Blood in Urine, Change in Urinary Stream, Frequency, Impotence, Nocturia, Painful Urination, Urgency and Urine Leakage. Musculoskeletal Not Present- Back Pain, Joint Pain, Joint Stiffness, Muscle Pain, Muscle Weakness and Swelling of Extremities. Neurological Not Present- Decreased Memory, Fainting, Headaches, Numbness, Seizures, Tingling, Tremor, Trouble walking and Weakness. Psychiatric Present- Anxiety. Not Present- Bipolar,  Change in Sleep Pattern, Depression, Fearful and Frequent crying. Hematology Not Present- Easy Bruising, Excessive bleeding, Gland problems, HIV and Persistent Infections.   Vitals Festus Holts LPN; 10/25/5724 20:35 AM) 10/31/2014 10:19 AM Weight: 196.5 lb Height: 70in  Body Surface Area: 2.1 m Body Mass Index: 28.19 kg/m Temp.: 98.30F(Temporal)  Pulse: 78 (Regular)  Resp.: 20 (Unlabored)  BP: 138/86 (Sitting, Left Arm, Standard)    Physical Exam Randall Hiss M. Ricarda Atayde MD; 10/31/2014 3:23 PM) General Mental Status-Alert. General Appearance-Consistent with stated age. Hydration-Well hydrated. Voice-Normal.  Head and Neck Head-normocephalic, atraumatic with no lesions or palpable masses. Trachea-midline. Thyroid Gland Characteristics - normal size and consistency.  Eye Eyeball - Bilateral-Extraocular movements intact. Sclera/Conjunctiva - Bilateral-No scleral icterus.  Chest and Lung Exam Chest and lung exam reveals -quiet, even and easy respiratory effort with no use of accessory muscles and on auscultation, normal breath sounds, no adventitious sounds and normal vocal resonance. Inspection Chest Wall - Normal. Back - normal.  Breast - Did not examine.  Cardiovascular Cardiovascular examination reveals -normal heart sounds, regular rate and rhythm with no murmurs and normal pedal pulses bilaterally.  Abdomen Inspection Inspection of the abdomen reveals - No Hernias. Skin - Scar - no surgical scars. Palpation/Percussion Palpation and Percussion of the abdomen reveal - Soft, Non Tender, No Rebound tenderness, No Rigidity (guarding) and No hepatosplenomegaly. Auscultation Auscultation of the abdomen reveals - Bowel sounds normal.  Male Genitourinary Note: both testicles down; normal penis; examined supine and standing. no obvious bulge. no LIH. +enlarged ring on right with some Tenderness; Has impulse in rt canal with valsalva   Peripheral  Vascular Upper Extremity Palpation - Pulses bilaterally normal.  Neurologic Neurologic evaluation reveals -alert and oriented x 3 with no impairment of recent or remote memory. Mental Status-Normal.  Neuropsychiatric The patient's mood and affect are described as -normal. Judgment and Insight-insight is appropriate concerning matters relevant to self.  Musculoskeletal Normal Exam - Left-Upper Extremity Strength Normal and Lower Extremity Strength Normal. Normal Exam - Right-Upper Extremity Strength Normal and Lower Extremity Strength Normal.  Lymphatic Head & Neck  General Head & Neck Lymphatics: Bilateral - Description - Normal. Axillary - Did not examine. Femoral & Inguinal - Did not examine.    Assessment & Plan Randall Hiss M. Mariafernanda Hendricksen MD; 10/31/2014 3:27 PM) RIGHT INGUINAL HERNIA (550.90  K40.90) Impression: He does not have a large bulky hernia; however, he does have an enlarged inguinal ring on exam. There is an impulse in the ring on exam; moreover, he does have an atypical appearance to the right groin on CT scan. Therefore I think this all points 2 and is consistent with an inguinal hernia. We discussed that with preoperative sharp burning pain that makes him slightly higher risk for chronic groin pain after surgery Current Plans  We discussed the etiology of inguinal hernias. We discussed the signs & symptoms of incarceration & strangulation. We discussed non-operative and operative management. We also discussed open and laparoscopic approaches.  I described laparoscopic inguinal hernia repair procedure in detail. The patient was given educational material. We discussed the risks and benefits including but not limited to bleeding, infection, chronic inguinal pain, nerve entrapment, hernia recurrence, mesh complications, hematoma formation, urinary retention, injury to the testicles, numbness in the groin, blood clots, injury to the surrounding structures, and anesthesia  risk. We also discussed the typical post operative recovery course, including no heavy lifting for 4-6 weeks. I explained that the likelihood of improvement of their symptoms is good  Our office will contact you to schedule surgery in near future. Please call me if you have not heard from my office within 1 week  pick up flomax and starting taking 1 week before surgery including morning of surgery Started Flomax  0.4MG , 1 (one) Capsule daily, #12, 12 days starting 10/31/2014, No Refill.  Leighton Ruff. Redmond Pulling, MD, FACS General, Bariatric, & Minimally Invasive Surgery [name], [place]

## 2015-01-06 NOTE — Discharge Instructions (Signed)
[facility]  UMBILICAL OR INGUINAL HERNIA REPAIR: POST OP INSTRUCTIONS  Always review your discharge instruction sheet given to you by the facility where your surgery was performed. IF YOU HAVE DISABILITY OR FAMILY LEAVE FORMS, YOU MUST BRING THEM TO THE OFFICE FOR PROCESSING.   DO NOT GIVE THEM TO YOUR DOCTOR.  1. A  prescription for pain medication may be given to you upon discharge.  Take your pain medication as prescribed, if needed.  If narcotic pain medicine is not needed, then you may take acetaminophen (Tylenol) or ibuprofen (Advil) as needed. 2. Take your usually prescribed medications unless otherwise directed. 3. If you need a refill on your pain medication, please contact your pharmacy.  They will contact our office to request authorization. Prescriptions will not be filled after 5 pm or on week-ends. 4. You should follow a light diet the first 24 hours after arrival home, such as soup and crackers, etc.  Be sure to include lots of fluids daily.  Resume your normal diet the day after surgery. 5. Most patients will experience some swelling and bruising around the umbilicus or in the groin and scrotum.  Ice packs and reclining will help.  Swelling and bruising can take several days to resolve.  6. It is common to experience some constipation if taking pain medication after surgery.  Increasing fluid intake and taking a stool softener (such as Colace) will usually help or prevent this problem from occurring.  A mild laxative (Milk of Magnesia or Miralax) should be taken according to package directions if there are no bowel movements after 48 hours. 7. If your surgeon used skin glue on the incision, you may shower in 24 hours.  The glue will flake off over the next 2-3 weeks.  Any sutures or staples will be removed at the office during your follow-up visit. 8. ACTIVITIES:  You may resume regular (light) daily activities beginning the next day--such as daily self-care,  walking, climbing stairs--gradually increasing activities as tolerated.  You may have sexual intercourse when it is comfortable.  Refrain from any heavy lifting or straining until approved by your doctor. a. You may drive when you are no longer taking prescription pain medication, you can comfortably wear a seatbelt, and you can safely maneuver your car and apply brakes. b. RETURN TO WORK:  9. You should see your doctor in the office for a follow-up appointment approximately 2-3 weeks after your surgery.  Make sure that you call for this appointment within a day or two after you arrive home to insure a convenient appointment time. 10. OTHER INSTRUCTIONS: DO NOT LIFT, PUSH, OR PULL ANYTHING > 10 LBS FOR AT LEAST 4 WEEKS    WHEN TO CALL YOUR DOCTOR: 1. Fever over 101.0 2. Inability to urinate 3. Nausea and/or vomiting 4. Extreme swelling or bruising 5. Continued bleeding from incision. 6. Increased pain, redness, or drainage from the incision  The clinic staff is available to answer your questions during regular business hours.  Please dont hesitate to call and ask to speak to one of the nurses for clinical concerns.  If you have a medical emergency, go to the nearest emergency room or call 911.  A surgeon from [facility] is always on call at the hospital   [address] [place], [place], [place]  [address] ?  [address], [place], [place]   [address] [phone] ? [phone] ? FAX [phone] Web site: [email or web address]

## 2015-01-06 NOTE — Interval H&P Note (Signed)
History and Physical Interval Note:  01/06/2015 9:37 AM  Jerry Lopez  has presented today for surgery, with the diagnosis of Right Inguinal Hernia  The various methods of treatment have been discussed with the patient and family. After consideration of risks, benefits and other options for treatment, the patient has consented to  Procedure(s): Stanton (Right) INSERTION OF MESH (Right) as a surgical intervention .  The patient's history has been reviewed, patient examined, no change in status, stable for surgery.  I have reviewed the patient's chart and labs.  Questions were answered to the patient's satisfaction.    Leighton Ruff. Redmond Pulling, MD, [place], Bariatric, & Minimally Invasive Surgery [facility], [place]    [facility]

## 2015-01-06 NOTE — Transfer of Care (Signed)
Immediate Anesthesia Transfer of Care Note  Patient: Jerry Lopez  Procedure(s) Performed: Procedure(s) (LRB): LAPAROSCOPIC REPAIR OF RIGHT INGUINAL HERNIA WITH MESH (Right) INSERTION OF MESH (Right)  Patient Location: PACU  Anesthesia Type: General  Level of Consciousness: sedated, patient cooperative and responds to stimulation  Airway & Oxygen Therapy: Patient Spontanous Breathing and Patient connected to face mask oxgen  Post-op Assessment: Report given to PACU RN and Post -op Vital signs reviewed and stable  Post vital signs: Reviewed and stable  Complications: No apparent anesthesia complications

## 2015-01-06 NOTE — Anesthesia Postprocedure Evaluation (Signed)
Anesthesia Post Note  Patient: Jerry Lopez  Procedure(s) Performed: Procedure(s) (LRB): LAPAROSCOPIC REPAIR OF RIGHT INGUINAL HERNIA WITH MESH (Right) INSERTION OF MESH (Right)  Anesthesia type: general  Patient location: PACU  Post pain: Pain level controlled  Post assessment: Patient's Cardiovascular Status Stable  Last Vitals:  Filed Vitals:   01/06/15 1215  BP: 111/70  Pulse: 68  Temp: 36.8 C  Resp: 13    Post vital signs: Reviewed and stable  Level of consciousness: sedated  Complications: No apparent anesthesia complications

## 2015-01-06 NOTE — Op Note (Signed)
01/06/2015  Phineas Semen 1981/03/21   PREOPERATIVE DIAGNOSIS: right inguinal hernia.   POSTOPERATIVE DIAGNOSIS: right indirect inguinal hernia.   PROCEDURE: Laparoscopic repair of right indirect inguinal hernia with  mesh (TAPP).   SURGEON: Leighton Ruff. Redmond Pulling, MD   ASSISTANT SURGEON: None.   ANESTHESIA: General plus local consisting of 0.25% Marcaine with epi and 30cc exparel toward end of case  ESTIMATED BLOOD LOSS: Minimal.   FINDINGS: The patient had a right indirect.  It was repaired using a 3 inch x 6  inch piece of Ethicon UltraPro mesh. He had a decent cord lipoma.   SPECIMEN: lipoma - discarded  INDICATIONS FOR PROCEDURE: 34 yo WM with symptomatic right inguinal hernia with pain who desires repair. Please see my h&p. The risks and benefits including but not limited to bleeding, infection, chronic inguinal pain, nerve entrapment, hernia recurrence, mesh complications, hematoma formation, urinary retention, injury to the testicles or the ovaries, numbness in the groin, blood clots, injury to the surrounding structures, and anesthesia risk was discussed with the patient.  DESCRIPTION OF PROCEDURE: After obtaining verbal consent and marking  the right groin in the holding area with the patient confirming the  operative site, the patient was then taken back to the operating room, placed  supine on the operating room table. General endotracheal anesthesia was  established. The patient had emptied their bladder prior to going back to  the operating room. Sequential compression devices were placed. The  abdomen and groin were prepped and draped in the usual standard surgical  fashion with ChloraPrep. The patient received IV  antibiotic prior to the incision. A surgical time-out was performed.  Local was infiltrated at the base of the umbilicus.   Next, a 1-cm vertical infraumbilical incision was made with a #11 blade. The fascia  was grasped and lifted anteriorly. Next, the  fascia was incised, and  the abdominal cavity was entered. Pursestring suture was placed around  the fascial edges using a 0 Vicryl. A 12-mm Hasson trocar was placed.  Pneumoperitoneum was smoothly established up to a patient pressure of 15  mmHg. Laparoscope was advanced. There was no evidence of a  contralateral hernia. The patient had a defect lateral to  the inferior epigastric vessel, consistent with an right indirect  hernia. Two 5-mm trocars were placed, one on the right, one on the left  in the midclavicular line slightly above the level of the umbilicus all  under direct visualization. After local had been infiltrated, I then  made incision along the peritoneum on the right, starting 2 inches above  the anterior superior iliac spine and caring it medial  toward the median umbilical ligament in a lazy S configuration using  Endo Shears with electrocautery. The peritoneal flap was then gently  dissected downward from the anterior abdominal wall taking care not to  injure the inferior epigastric vessels. The pubic bone was identified.  The testicular vessels were identified.  Using  traction and counter traction with short graspers, I reduced the sac in  its entirety. The testicular vessels had been identified and preserved. The vas deferens was identified and preserved, and the hernia sac was stripped from those to  surrounding structures. I also stripped a decent sized cord lipoma and removed it from the abdomen.  I then went about creating a large pocket by  lifting the peritoneum of the pelvic floor. I took great care not to  injure the iliac vessels. Exparel was injected 2 finger breadths below and  medial to the anterior superior iliac spine as well as along the right groin prior to placing the mesh. I then obtained a piece of Ethicon UltraPro mesh 3 inch x  6 inch, placed it through the Hasson trocar, half of it covered medial  to the inferior epigastric vessels and half of it  lateral to the  inferior epigastric vessels. The defect was well  covered with the mesh. I then secured the mesh to the abdominal wall  using an Ethicon secure strap tack. Tacks were placed through  the Cooper's ligament, one tack on each side of the inferior epigastric  vessel and two tacks out laterally. No tacks were placed below the  shelving edge of the inguinal ligament. Pneumoperitoneum was reduced  to 8 mmHg. I then brought the peritoneal flap back up to the abdominal  wall and tacked it to the abdominal wall using 4 tacks. There was a small 0.5cm  defect in the peritoneum which I left alone, and the mesh was well covered. I removed the  Hasson trocar and tied down the previously placed pursestring suture.  The closure was viewed laparoscopically. There was no evidence of  fascial defect. There was no air leak at the umbilicus. There was no  evidence of injury to surrounding structures. Pneumoperitoneum was  released, and the remaining trocars were removed. All skin incisions  were closed with a 4-0 Monocryl in a subcuticular fashion followed by  application of Dermabond. All needle, instrument, and sponge counts  were correct x2. There are no immediate complications. The patient  tolerated the procedure well. The patient was extubated and taken to the  recovery room in stable condition.  Leighton Ruff. Redmond Pulling, MD, FACS General, Bariatric, & Minimally Invasive Surgery [facility], [place]

## 2015-01-07 ENCOUNTER — Encounter (HOSPITAL_COMMUNITY): Payer: Self-pay | Admitting: General Surgery

## 2015-02-10 ENCOUNTER — Encounter: Payer: Self-pay | Admitting: Physician Assistant

## 2015-02-10 ENCOUNTER — Ambulatory Visit (INDEPENDENT_AMBULATORY_CARE_PROVIDER_SITE_OTHER): Payer: BLUE CROSS/BLUE SHIELD | Admitting: Physician Assistant

## 2015-02-10 VITALS — BP 129/78 | HR 78 | Temp 98.4°F | Resp 16 | Ht 69.0 in | Wt 193.5 lb

## 2015-02-10 DIAGNOSIS — R1031 Right lower quadrant pain: Secondary | ICD-10-CM | POA: Diagnosis not present

## 2015-02-10 NOTE — Assessment & Plan Note (Signed)
S/p hernia repair.  Began with episode of heavy lifting.  Resolved.  Exam within normal limits. Ok to return to work with restrictions.  Work note given.  Continue pain medication regimen as directed.  Follow-up with surgeon as scheduled.  Alarm signs/symptoms that should prompt ER evaluation discussed with patient.

## 2015-02-10 NOTE — Progress Notes (Signed)
Pre visit review using our clinic review tool, if applicable. No additional management support is needed unless otherwise documented below in the visit note/SLS  

## 2015-02-10 NOTE — Progress Notes (Signed)
Patient presents to clinic today c/o flair up of R inguinal pain yesterday after episode of heavy lifting.  Endorses non-radiating pain.  Denies fever, chills or drainage from site.  Endorses good urinary and bowel output. No pain at present.  Did miss work yesterday due to symptoms and was told by boss he needed evaluation.  Past Medical History  Diagnosis Date  . Hypercholesteremia   . Anxiety   . GERD (gastroesophageal reflux disease)   . Arthritis   . Chest pain around 12-02-2014    Current Outpatient Prescriptions on File Prior to Visit  Medication Sig Dispense Refill  . ALPRAZolam (XANAX) 0.5 MG tablet Take up to twice daily as needed for breakthrough anxiety 60 tablet 0  . atorvastatin (LIPITOR) 20 MG tablet TAKE 1 TABLET (20 MG TOTAL) BY MOUTH DAILY. 30 tablet 6  . esomeprazole (NEXIUM) 20 MG capsule Take 20 mg by mouth daily at 12 noon.    . fenofibrate 160 MG tablet TAKE 1 TABLET (160 MG TOTAL) BY MOUTH DAILY. 30 tablet 6  . sertraline (ZOLOFT) 100 MG tablet TAKE 1/2 (50 mg) TABLET BY MOUTH DAILY.    Marland Kitchen Docusate Calcium (STOOL SOFTENER PO) Take 1 capsule by mouth daily.    Marland Kitchen oxyCODONE (OXY IR/ROXICODONE) 5 MG immediate release tablet Take 1-2 tablets (5-10 mg total) by mouth every 4 (four) hours as needed for moderate pain, severe pain or breakthrough pain. (Patient not taking: Reported on 02/10/2015) 50 tablet 0   No current facility-administered medications on file prior to visit.    No Known Allergies  Family History  Problem Relation Age of Onset  . Hypertension Father     Living  . Non-Hodgkin's lymphoma Father   . Diabetes Father   . Healthy Mother     Living  . Hypertension Other     Paternal side  . Heart attack Paternal Grandfather   . Healthy Sister     x1  . Healthy Daughter     x3    History   Social History  . Marital Status: Married    Spouse Name: N/A  . Number of Children: N/A  . Years of Education: N/A   Social History Main Topics  .  Smoking status: Current Every Day Smoker -- 0.25 packs/day for 5 years  . Smokeless tobacco: Never Used  . Alcohol Use: 3.6 oz/week    6 Cans of beer per week     Comment: occasional  . Drug Use: No  . Sexual Activity: Yes    Birth Control/ Protection: None   Other Topics Concern  . None   Social History Narrative    Review of Systems - See HPI.  All other ROS are negative.  BP 129/78 mmHg  Pulse 78  Temp(Src) 98.4 F (36.9 C) (Oral)  Resp 16  Ht 5' 9" (1.753 m)  Wt 193 lb 8 oz (87.771 kg)  BMI 28.56 kg/m2  SpO2 99%  Physical Exam  Constitutional: He is oriented to person, place, and time and well-developed, well-nourished, and in no distress.  HENT:  Head: Normocephalic and atraumatic.  Eyes: Conjunctivae are normal.  Cardiovascular: Normal rate, regular rhythm, normal heart sounds and intact distal pulses.   Pulmonary/Chest: Effort normal and breath sounds normal. No respiratory distress. He has no wheezes. He has no rales. He exhibits no tenderness.  Abdominal: Soft. Bowel sounds are normal. He exhibits no distension and no mass. There is no tenderness. There is no rebound and no  guarding.  Laparoscopic scars noted x 3 with routine healing.  Neurological: He is alert and oriented to person, place, and time.  Skin: Skin is warm and dry. No rash noted.  Psychiatric: Affect normal.  Vitals reviewed.   Recent Results (from the past 2160 hour(s))  Basic metabolic panel     Status: Abnormal   Collection Time: 12/31/14  2:55 PM  Result Value Ref Range   Sodium 141 135 - 145 mmol/L   Potassium 4.9 3.5 - 5.1 mmol/L   Chloride 109 96 - 112 mmol/L   CO2 26 19 - 32 mmol/L   Glucose, Bld 109 (H) 70 - 99 mg/dL   BUN 23 6 - 23 mg/dL   Creatinine, Ser 1.17 0.50 - 1.35 mg/dL   Calcium 9.3 8.4 - 10.5 mg/dL   GFR calc non Af Amer 80 (L) >90 mL/min   GFR calc Af Amer >90 >90 mL/min    Comment: (NOTE) The eGFR has been calculated using the CKD EPI equation. This calculation  has not been validated in all clinical situations. eGFR's persistently <90 mL/min signify possible Chronic Kidney Disease.    Anion gap 6 5 - 15  CBC     Status: None   Collection Time: 12/31/14  2:55 PM  Result Value Ref Range   WBC 6.3 4.0 - 10.5 K/uL   RBC 4.67 4.22 - 5.81 MIL/uL   Hemoglobin 14.1 13.0 - 17.0 g/dL   HCT 40.6 39.0 - 52.0 %   MCV 86.9 78.0 - 100.0 fL   MCH 30.2 26.0 - 34.0 pg   MCHC 34.7 30.0 - 36.0 g/dL   RDW 12.7 11.5 - 15.5 %   Platelets 162 150 - 400 K/uL    Assessment/Plan: Inguinal pain S/p hernia repair.  Began with episode of heavy lifting.  Resolved.  Exam within normal limits. Ok to return to work with restrictions.  Work note given.  Continue pain medication regimen as directed.  Follow-up with surgeon as scheduled.  Alarm signs/symptoms that should prompt ER evaluation discussed with patient.

## 2015-02-10 NOTE — Patient Instructions (Signed)
Your exam looks good. Stay well hydrated and limit heavy lifting.  Continue pain medication as directed if needed. Follow-up with surgery as scheduled.  Return if symptoms recur.

## 2015-04-28 ENCOUNTER — Other Ambulatory Visit: Payer: Self-pay | Admitting: Physician Assistant

## 2015-05-07 ENCOUNTER — Ambulatory Visit (INDEPENDENT_AMBULATORY_CARE_PROVIDER_SITE_OTHER): Payer: BLUE CROSS/BLUE SHIELD | Admitting: Physician Assistant

## 2015-05-07 ENCOUNTER — Ambulatory Visit (HOSPITAL_BASED_OUTPATIENT_CLINIC_OR_DEPARTMENT_OTHER)
Admission: RE | Admit: 2015-05-07 | Discharge: 2015-05-07 | Disposition: A | Discharge status: Home / Self Care | Payer: BLUE CROSS/BLUE SHIELD | Source: Ambulatory Visit | Attending: Physician Assistant | Admitting: Physician Assistant

## 2015-05-07 ENCOUNTER — Encounter: Payer: Self-pay | Admitting: Physician Assistant

## 2015-05-07 VITALS — BP 136/77 | HR 66 | Temp 98.4°F | Ht 69.0 in | Wt 200.8 lb

## 2015-05-07 DIAGNOSIS — M898X1 Other specified disorders of bone, shoulder: Secondary | ICD-10-CM

## 2015-05-07 DIAGNOSIS — M25512 Pain in left shoulder: Secondary | ICD-10-CM | POA: Insufficient documentation

## 2015-05-07 MED ORDER — MELOXICAM 15 MG PO TABS: 15.0000 mg | ORAL_TABLET | Freq: Every day | ORAL | Status: DC

## 2015-05-07 NOTE — Patient Instructions (Signed)
Please go downstairs for x-ray. I will call you with your results. Please take Mobic daily with food.  Avoid heavy lifting. No gym for 1 week. Apply topical Icy Hot or Salon Pas to the area. Please start the Nicorette gum as directed.  Smoking Cessation, Tips for Success If you are ready to quit smoking, congratulations! You have chosen to help yourself be healthier. Cigarettes bring nicotine, tar, carbon monoxide, and other irritants into your body. Your lungs, heart, and blood vessels will be able to work better without these poisons. There are many different ways to quit smoking. Nicotine gum, nicotine patches, a nicotine inhaler, or nicotine nasal spray can help with physical craving. Hypnosis, support groups, and medicines help break the habit of smoking. WHAT THINGS CAN I DO TO MAKE QUITTING EASIER?  Here are some tips to help you quit for good:  Pick a date when you will quit smoking completely. Tell all of your friends and family about your plan to quit on that date.  Do not try to slowly cut down on the number of cigarettes you are smoking. Pick a quit date and quit smoking completely starting on that day.  Throw away all cigarettes.   Clean and remove all ashtrays from your home, work, and car.  On a card, write down your reasons for quitting. Carry the card with you and read it when you get the urge to smoke.  Cleanse your body of nicotine. Drink enough water and fluids to keep your urine clear or pale yellow. Do this after quitting to flush the nicotine from your body.  Learn to predict your moods. Do not let a bad situation be your excuse to have a cigarette. Some situations in your life might tempt you into wanting a cigarette.  Never have "just one" cigarette. It leads to wanting another and another. Remind yourself of your decision to quit.  Change habits associated with smoking. If you smoked while driving or when feeling stressed, try other activities to replace smoking.  Stand up when drinking your coffee. Brush your teeth after eating. Sit in a different chair when you read the paper. Avoid alcohol while trying to quit, and try to drink fewer caffeinated beverages. Alcohol and caffeine may urge you to smoke.  Avoid foods and drinks that can trigger a desire to smoke, such as sugary or spicy foods and alcohol.  Ask people who smoke not to smoke around you.  Have something planned to do right after eating or having a cup of coffee. For example, plan to take a walk or exercise.  Try a relaxation exercise to calm you down and decrease your stress. Remember, you may be tense and nervous for the first 2 weeks after you quit, but this will pass.  Find new activities to keep your hands busy. Play with a pen, coin, or rubber band. Doodle or draw things on paper.  Brush your teeth right after eating. This will help cut down on the craving for the taste of tobacco after meals. You can also try mouthwash.   Use oral substitutes in place of cigarettes. Try using lemon drops, carrots, cinnamon sticks, or chewing gum. Keep them handy so they are available when you have the urge to smoke.  When you have the urge to smoke, try deep breathing.  Designate your home as a nonsmoking area.  If you are a heavy smoker, ask your health care provider about a prescription for nicotine chewing gum. It can ease your  withdrawal from nicotine.  Reward yourself. Set aside the cigarette money you save and buy yourself something nice.  Look for support from others. Join a support group or smoking cessation program. Ask someone at home or at work to help you with your plan to quit smoking.  Always ask yourself, "Do I need this cigarette or is this just a reflex?" Tell yourself, "Today, I choose not to smoke," or "I do not want to smoke." You are reminding yourself of your decision to quit.  Do not replace cigarette smoking with electronic cigarettes (commonly called e-cigarettes). The  safety of e-cigarettes is unknown, and some may contain harmful chemicals.  If you relapse, do not give up! Plan ahead and think about what you will do the next time you get the urge to smoke. HOW WILL I FEEL WHEN I QUIT SMOKING? You may have symptoms of withdrawal because your body is used to nicotine (the addictive substance in cigarettes). You may crave cigarettes, be irritable, feel very hungry, cough often, get headaches, or have difficulty concentrating. The withdrawal symptoms are only temporary. They are strongest when you first quit but will go away within 10-14 days. When withdrawal symptoms occur, stay in control. Think about your reasons for quitting. Remind yourself that these are signs that your body is healing and getting used to being without cigarettes. Remember that withdrawal symptoms are easier to treat than the major diseases that smoking can cause.  Even after the withdrawal is over, expect periodic urges to smoke. However, these cravings are generally short lived and will go away whether you smoke or not. Do not smoke! WHAT RESOURCES ARE AVAILABLE TO HELP ME QUIT SMOKING? Your health care provider can direct you to community resources or hospitals for support, which may include:  Group support.  Education.  Hypnosis.  Therapy. Document Released: 07/09/2004 Document Revised: 02/25/2014 Document Reviewed: 03/29/2013 [facility] Patient Information 2015 [place], [place]. This information is not intended to replace advice given to you by your health care provider. Make sure you discuss any questions you have with your health care provider.

## 2015-05-07 NOTE — Progress Notes (Signed)
Patient presents to clinic today c/o pain in left shoulder blade x 3 months described as a pulling sensation occurring daily with ROM. Denies trauma or injury. Has been weight lifting.  Denies radiation of pain. Notes pain worsening over last week after playing basketball. Is a smoker and is worried about cancer giving history. Denies cough or hemoptysis. Denies shortness of breath or sputum production.  Past Medical History  Diagnosis Date  . Hypercholesteremia   . Anxiety   . GERD (gastroesophageal reflux disease)   . Arthritis   . Chest pain around 12-02-2014    Current Outpatient Prescriptions on File Prior to Visit  Medication Sig Dispense Refill  . ALPRAZolam (XANAX) 0.5 MG tablet Take up to twice daily as needed for breakthrough anxiety 60 tablet 0  . atorvastatin (LIPITOR) 20 MG tablet TAKE 1 TABLET (20 MG TOTAL) BY MOUTH DAILY. 30 tablet 0  . Docusate Calcium (STOOL SOFTENER PO) Take 1 capsule by mouth daily.    Marland Kitchen esomeprazole (NEXIUM) 20 MG capsule Take 20 mg by mouth daily at 12 noon.    . fenofibrate 160 MG tablet TAKE 1 TABLET (160 MG TOTAL) BY MOUTH DAILY. 30 tablet 0  . sertraline (ZOLOFT) 100 MG tablet TAKE 1/2 (50 mg) TABLET BY MOUTH DAILY.     No current facility-administered medications on file prior to visit.    No Known Allergies  Family History  Problem Relation Age of Onset  . Hypertension Father     Living  . Non-Hodgkin's lymphoma Father   . Diabetes Father   . Healthy Mother     Living  . Hypertension Other     Paternal side  . Heart attack Paternal Grandfather   . Healthy Sister     x1  . Healthy Daughter     x3    History   Social History  . Marital Status: Married    Spouse Name: N/A  . Number of Children: N/A  . Years of Education: N/A   Social History Main Topics  . Smoking status: Current Every Day Smoker -- 0.25 packs/day for 5 years  . Smokeless tobacco: Never Used  . Alcohol Use: 3.6 oz/week    6 Cans of beer per week   Comment: occasional  . Drug Use: No  . Sexual Activity: Yes    Birth Control/ Protection: None   Other Topics Concern  . None   Social History Narrative   Review of Systems - See HPI.  All other ROS are negative.  BP 136/77 mmHg  Pulse 66  Temp(Src) 98.4 F (36.9 C) (Oral)  Ht 5\' 9"  (1.753 m)  Wt 200 lb 12.8 oz (91.082 kg)  BMI 29.64 kg/m2  SpO2 99%  Physical Exam  Constitutional: He is well-developed, well-nourished, and in no distress.  HENT:  Head: Normocephalic and atraumatic.  Eyes: Conjunctivae are normal.  Cardiovascular: Normal rate, regular rhythm, normal heart sounds and intact distal pulses.   Pulmonary/Chest: Effort normal and breath sounds normal. No respiratory distress. He has no wheezes. He has no rales. He exhibits no tenderness.  Musculoskeletal:       Back:  Skin: Skin is warm and dry.  Vitals reviewed.   No results found for this or any previous visit (from the past 2160 hour(s)).  Assessment/Plan: Pain of left scapula X-ray of left scapula and chest unremarkable which is great. Pain mors likely MSK in nature giving history and examination. Rx Mobic once daily with food. Supportive measures and stretching  exercises reviewed. Follow-up 2 weeks.

## 2015-05-07 NOTE — Progress Notes (Signed)
Pre visit review using our clinic review tool, if applicable. No additional management support is needed unless otherwise documented below in the visit note. 

## 2015-05-10 NOTE — Assessment & Plan Note (Signed)
X-ray of left scapula and chest unremarkable which is great. Pain mors likely MSK in nature giving history and examination. Rx Mobic once daily with food. Supportive measures and stretching exercises reviewed. Follow-up 2 weeks.

## 2015-06-02 ENCOUNTER — Other Ambulatory Visit: Payer: Self-pay | Admitting: Physician Assistant

## 2015-06-11 ENCOUNTER — Telehealth: Payer: Self-pay | Admitting: Physician Assistant

## 2015-06-11 DIAGNOSIS — F419 Anxiety disorder, unspecified: Principal | ICD-10-CM

## 2015-06-11 DIAGNOSIS — F32A Depression, unspecified: Secondary | ICD-10-CM

## 2015-06-11 DIAGNOSIS — F329 Major depressive disorder, single episode, unspecified: Secondary | ICD-10-CM

## 2015-06-11 MED ORDER — ALPRAZOLAM 0.5 MG PO TABS: ORAL_TABLET | ORAL | Status: DC

## 2015-06-11 NOTE — Telephone Encounter (Signed)
Spoke with patient apologizing as I was not aware of refill or his visit to the office today until moments ago. Daughter very sick with both physical and mental illness requiring hospitalization that is causing much stress and anxiety. Patient with PRN Rx for xanax that is very rarely used. Medication refill faxed to pharmacy. Will worry about CSC and UDS at follow-up if medication is continued.

## 2015-06-11 NOTE — Telephone Encounter (Signed)
Pt came in office wanting to see if rx was ready, pt was informed Provider was seeing pt at the moment and will call him ASAP when rx is ready.

## 2015-06-11 NOTE — Telephone Encounter (Signed)
Patient requesting Alprazolam refill:   LOV: 05/07/15  Last refill: 05/23/14 for 60 and 0- states takes it very rarely but is having stressful time right now No UDS/ CSC Please advise.

## 2015-06-11 NOTE — Telephone Encounter (Signed)
Relation to pt: self  Call back number: [phone] Pharmacy: [id number] - [place], [place] [phone] (Phone) [phone] (Fax)         Reason for call:  Patient requesting a refill ALPRAZolam (XANAX) 0.5 MG tablet. Patient states his daughter is in the hospital and he took his last one.

## 2015-07-07 ENCOUNTER — Other Ambulatory Visit: Payer: Self-pay | Admitting: Physician Assistant

## 2015-09-08 ENCOUNTER — Encounter: Payer: Self-pay | Admitting: Physician Assistant

## 2015-09-08 ENCOUNTER — Ambulatory Visit (INDEPENDENT_AMBULATORY_CARE_PROVIDER_SITE_OTHER): Payer: BLUE CROSS/BLUE SHIELD | Admitting: Physician Assistant

## 2015-09-08 ENCOUNTER — Ambulatory Visit (HOSPITAL_BASED_OUTPATIENT_CLINIC_OR_DEPARTMENT_OTHER)
Admission: RE | Admit: 2015-09-08 | Discharge: 2015-09-08 | Disposition: A | Discharge status: Home / Self Care | Payer: BLUE CROSS/BLUE SHIELD | Source: Ambulatory Visit | Attending: Physician Assistant | Admitting: Physician Assistant

## 2015-09-08 VITALS — BP 118/74 | HR 61 | Temp 97.6°F | Resp 16 | Ht 69.0 in | Wt 198.5 lb

## 2015-09-08 DIAGNOSIS — G8929 Other chronic pain: Secondary | ICD-10-CM | POA: Diagnosis not present

## 2015-09-08 DIAGNOSIS — M7712 Lateral epicondylitis, left elbow: Secondary | ICD-10-CM

## 2015-09-08 DIAGNOSIS — M25522 Pain in left elbow: Secondary | ICD-10-CM | POA: Insufficient documentation

## 2015-09-08 DIAGNOSIS — M7710 Lateral epicondylitis, unspecified elbow: Secondary | ICD-10-CM | POA: Insufficient documentation

## 2015-09-08 MED ORDER — TRAMADOL HCL 50 MG PO TABS: 50.0000 mg | ORAL_TABLET | Freq: Three times a day (TID) | ORAL | Status: DC | PRN

## 2015-09-08 MED ORDER — MELOXICAM 15 MG PO TABS
15.0000 mg | ORAL_TABLET | Freq: Every day | ORAL | Status: DC
Start: 2015-09-08 — End: 2015-12-22

## 2015-09-08 NOTE — Progress Notes (Signed)
Pre visit review using our clinic review tool, if applicable. No additional management support is needed unless otherwise documented below in the visit note/SLS  

## 2015-09-08 NOTE — Assessment & Plan Note (Signed)
Most likely culprit but giving chronicity will obtain plain film to assess joint. Rx Mobic for inflammation. Rx Tramadol for breakthrough pain. Supportive measures reviewed. Follow-up if symptoms not improving/resolving.

## 2015-09-08 NOTE — Progress Notes (Signed)
    Patient presents to clinic today c/o 2 months of intermittent of lateral L elbow pain associated with swelling and stiffness. Endorses most recent episode starting yesterday. Denies fever, chills, redness or warmth at site. Denies trauma or injury to left elbow. Denies history of gout. Patient is currently working in a [employer], [employer] and does heavy lifting daily.  Past Medical History  Diagnosis Date  . Hypercholesteremia   . Anxiety   . GERD (gastroesophageal reflux disease)   . Arthritis   . Chest pain around 12-02-2014    Current Outpatient Prescriptions on File Prior to Visit  Medication Sig Dispense Refill  . ALPRAZolam (XANAX) 0.5 MG tablet Take up to twice daily as needed for breakthrough anxiety 60 tablet 0  . atorvastatin (LIPITOR) 20 MG tablet Take 1 tablet (20 mg total) by mouth daily at 6 PM. Need physical before further refills. 30 tablet 0  . esomeprazole (NEXIUM) 20 MG capsule Take 20 mg by mouth daily at 12 noon.    . fenofibrate 160 MG tablet TAKE 1 TABLET (160 MG TOTAL) BY MOUTH DAILY. 30 tablet 0  . sertraline (ZOLOFT) 100 MG tablet TAKE 1/2 (50 mg) TABLET BY MOUTH DAILY.     No current facility-administered medications on file prior to visit.    No Known Allergies  Family History  Problem Relation Age of Onset  . Hypertension Father     Living  . Non-Hodgkin's lymphoma Father   . Diabetes Father   . Healthy Mother     Living  . Hypertension Other     Paternal side  . Heart attack Paternal Grandfather   . Healthy Sister     x1  . Healthy Daughter     x3    Social History   Social History  . Marital Status: Married    Spouse Name: N/A  . Number of Children: N/A  . Years of Education: N/A   Social History Main Topics  . Smoking status: Current Every Day Smoker -- 0.25 packs/day for 5 years  . Smokeless tobacco: Never Used  . Alcohol Use: 3.6 oz/week    6 Cans of beer per week     Comment: occasional  . Drug Use: No  . Sexual Activity: Yes     Birth Control/ Protection: None   Other Topics Concern  . None   Social History Narrative   Review of Systems - See HPI.  All other ROS are negative.  BP 118/74 mmHg  Pulse 61  Temp(Src) 97.6 F (36.4 C) (Oral)  Resp 16  Ht 5\' 9"  (1.753 m)  Wt 198 lb 8 oz (90.039 kg)  BMI 29.30 kg/m2  SpO2 100%  Physical Exam  Constitutional: He is oriented to person, place, and time and well-developed, well-nourished, and in no distress.  HENT:  Head: Normocephalic and atraumatic.  Cardiovascular: Normal rate, regular rhythm, normal heart sounds and intact distal pulses.   Musculoskeletal:       Left elbow: Tenderness found. Lateral epicondyle tenderness noted. No radial head, no medial epicondyle and no olecranon process tenderness noted.  Neurological: He is alert and oriented to person, place, and time.  Vitals reviewed.  No results found for this or any previous visit (from the past 2160 hour(s)).  Assessment/Plan: Lateral epicondylitis (tennis elbow) Most likely culprit but giving chronicity will obtain plain film to assess joint. Rx Mobic for inflammation. Rx Tramadol for breakthrough pain. Supportive measures reviewed. Follow-up if symptoms not improving/resolving.

## 2015-09-08 NOTE — Patient Instructions (Signed)
Please go downstairs for imaging.  I will call with your results. Start the Meloxciam daily as directed. Use Tramadol as directed for breakthrough pain.  Please apply ice to the elbow. Avoid heavy lifting. Apply topical Aspercreme. We will alter treatment based on results.

## 2015-10-07 ENCOUNTER — Other Ambulatory Visit: Payer: Self-pay | Admitting: Physician Assistant

## 2015-10-28 DIAGNOSIS — M7712 Lateral epicondylitis, left elbow: Secondary | ICD-10-CM | POA: Diagnosis not present

## 2015-12-01 ENCOUNTER — Telehealth: Payer: Self-pay | Admitting: Physician Assistant

## 2015-12-01 NOTE — Telephone Encounter (Signed)
Pt declined flu shot stating he never takes it. Scheduled CPE for 12/08/15

## 2015-12-02 NOTE — Telephone Encounter (Signed)
Flu shot record declined.//AB/CMA

## 2015-12-05 ENCOUNTER — Telehealth: Payer: Self-pay | Admitting: *Deleted

## 2015-12-05 NOTE — Telephone Encounter (Signed)
Unable to reach patient at time of pre-visit call. Left message for patient to return call when available.  

## 2015-12-08 ENCOUNTER — Encounter: Payer: BLUE CROSS/BLUE SHIELD | Admitting: Physician Assistant

## 2015-12-19 ENCOUNTER — Telehealth: Payer: Self-pay | Admitting: Behavioral Health

## 2015-12-19 NOTE — Telephone Encounter (Signed)
Unable to reach patient at time of Pre-Visit Call.  Left message for patient to return call when available.    

## 2015-12-22 ENCOUNTER — Ambulatory Visit (INDEPENDENT_AMBULATORY_CARE_PROVIDER_SITE_OTHER): Payer: BLUE CROSS/BLUE SHIELD | Admitting: Physician Assistant

## 2015-12-22 ENCOUNTER — Encounter: Payer: Self-pay | Admitting: Physician Assistant

## 2015-12-22 VITALS — BP 116/80 | HR 63 | Temp 98.2°F | Ht 69.0 in | Wt 196.0 lb

## 2015-12-22 DIAGNOSIS — Z720 Tobacco use: Secondary | ICD-10-CM

## 2015-12-22 DIAGNOSIS — Z0001 Encounter for general adult medical examination with abnormal findings: Secondary | ICD-10-CM | POA: Diagnosis not present

## 2015-12-22 DIAGNOSIS — G4700 Insomnia, unspecified: Secondary | ICD-10-CM | POA: Diagnosis not present

## 2015-12-22 DIAGNOSIS — Z0000 Encounter for general adult medical examination without abnormal findings: Secondary | ICD-10-CM | POA: Diagnosis not present

## 2015-12-22 DIAGNOSIS — K219 Gastro-esophageal reflux disease without esophagitis: Secondary | ICD-10-CM

## 2015-12-22 LAB — H. PYLORI ANTIBODY, IGG: H PYLORI IGG: NEGATIVE

## 2015-12-22 LAB — LIPID PANEL
Cholesterol: 205 mg/dL — ABNORMAL HIGH (ref 0–200)
HDL: 34.4 mg/dL — ABNORMAL LOW (ref >39.00)
LDL Cholesterol: 132 mg/dL — ABNORMAL HIGH (ref 0–99)
NONHDL: 170.56
Total CHOL/HDL Ratio: 6
Triglycerides: 194 mg/dL — ABNORMAL HIGH (ref 0.0–149.0)
VLDL: 38.8 mg/dL (ref 0.0–40.0)

## 2015-12-22 LAB — URINALYSIS, ROUTINE W REFLEX MICROSCOPIC
Bilirubin Urine: NEGATIVE
HGB URINE DIPSTICK: NEGATIVE
Ketones, ur: NEGATIVE
Leukocytes, UA: NEGATIVE
NITRITE: NEGATIVE
RBC / HPF: NONE SEEN (ref 0–?)
Specific Gravity, Urine: >=1.03 — AB (ref 1.000–1.030)
TOTAL PROTEIN, URINE-UPE24: NEGATIVE
URINE GLUCOSE: NEGATIVE
UROBILINOGEN UA: 0.2 (ref 0.0–1.0)
pH: 5 (ref 5.0–8.0)

## 2015-12-22 LAB — CBC
HEMATOCRIT: 43.5 % (ref 39.0–52.0)
HEMOGLOBIN: 15.1 g/dL (ref 13.0–17.0)
MCHC: 34.6 g/dL (ref 30.0–36.0)
MCV: 86.1 fl (ref 78.0–100.0)
Platelets: 173 10*3/uL (ref 150.0–400.0)
RBC: 5.05 Mil/uL (ref 4.22–5.81)
RDW: 13.4 % (ref 11.5–15.5)
WBC: 7.2 10*3/uL (ref 4.0–10.5)

## 2015-12-22 LAB — COMPREHENSIVE METABOLIC PANEL
ALBUMIN: 4.5 g/dL (ref 3.5–5.2)
ALT: 16 U/L (ref 0–53)
AST: 14 U/L (ref 0–37)
Alkaline Phosphatase: 44 U/L (ref 39–117)
BILIRUBIN TOTAL: 0.6 mg/dL (ref 0.2–1.2)
BUN: 19 mg/dL (ref 6–23)
CO2: 30 mEq/L (ref 19–32)
Calcium: 9.5 mg/dL (ref 8.4–10.5)
Chloride: 102 mEq/L (ref 96–112)
Creatinine, Ser: 1.18 mg/dL (ref 0.40–1.50)
GFR: 74.31 mL/min (ref >60.00)
GLUCOSE: 110 mg/dL — AB (ref 70–99)
Potassium: 3.9 mEq/L (ref 3.5–5.1)
SODIUM: 136 meq/L (ref 135–145)
TOTAL PROTEIN: 7.3 g/dL (ref 6.0–8.3)

## 2015-12-22 LAB — HEMOGLOBIN A1C: Hgb A1c MFr Bld: 5.5 % (ref 4.6–6.5)

## 2015-12-22 LAB — TSH: TSH: 0.71 u[IU]/mL (ref 0.35–4.50)

## 2015-12-22 MED ORDER — TRAZODONE HCL 50 MG PO TABS: 25.0000 mg | ORAL_TABLET | Freq: Every day | ORAL | Status: DC

## 2015-12-22 MED ORDER — PANTOPRAZOLE SODIUM 40 MG PO TBEC: 40.0000 mg | DELAYED_RELEASE_TABLET | Freq: Every day | ORAL | Status: DC

## 2015-12-22 MED ORDER — TRAZODONE HCL 50 MG PO TABS
25.0000 mg | ORAL_TABLET | Freq: Every day | ORAL | Status: DC
Start: 2015-12-22 — End: 2015-12-22

## 2015-12-22 NOTE — Patient Instructions (Signed)
Please go to the lab for blood work. I will call you with your results.  Please continue the Sertraline. Add on 1/2 tablet of the Trazodone at night to help with anxiety and insomnia.  Please start the Protonix instead of Nexium. Start over-the-counter Nicoderm CQ after making sure you tolerate the other new medications for.   Preventive Care for Adults, Male A healthy lifestyle and preventive care can promote health and wellness. Preventive health guidelines for men include the following key practices:  A routine yearly physical is a good way to check with your health care provider about your health and preventative screening. It is a chance to share any concerns and updates on your health and to receive a thorough exam.  Visit your dentist for a routine exam and preventative care every 6 months. Brush your teeth twice a day and floss once a day. Good oral hygiene prevents tooth decay and gum disease.  The frequency of eye exams is based on your age, health, family medical history, use of contact lenses, and other factors. Follow your health care provider's recommendations for frequency of eye exams.  Eat a healthy diet. Foods such as vegetables, fruits, whole grains, low-fat dairy products, and lean protein foods contain the nutrients you need without too many calories. Decrease your intake of foods high in solid fats, added sugars, and salt. Eat the right amount of calories for you.Get information about a proper diet from your health care provider, if necessary.  Regular physical exercise is one of the most important things you can do for your health. Most adults should get at least 150 minutes of moderate-intensity exercise (any activity that increases your heart rate and causes you to sweat) each week. In addition, most adults need muscle-strengthening exercises on 2 or more days a week.  Maintain a healthy weight. The body mass index (BMI) is a screening tool to identify possible weight  problems. It provides an estimate of body fat based on height and weight. Your health care provider can find your BMI and can help you achieve or maintain a healthy weight.For adults 20 years and older:  A BMI below 18.5 is considered underweight.  A BMI of 18.5 to 24.9 is normal.  A BMI of 25 to 29.9 is considered overweight.  A BMI of 30 and above is considered obese.  Maintain normal blood lipids and cholesterol levels by exercising and minimizing your intake of saturated fat. Eat a balanced diet with plenty of fruit and vegetables. Blood tests for lipids and cholesterol should begin at age 80 and be repeated every 5 years. If your lipid or cholesterol levels are high, you are over 50, or you are at high risk for heart disease, you may need your cholesterol levels checked more frequently.Ongoing high lipid and cholesterol levels should be treated with medicines if diet and exercise are not working.  If you smoke, find out from your health care provider how to quit. If you do not use tobacco, do not start.  Lung cancer screening is recommended for adults aged 51-80 years who are at high risk for developing lung cancer because of a history of smoking. A yearly low-dose CT scan of the lungs is recommended for people who have at least a 30-pack-year history of smoking and are a current smoker or have quit within the past 15 years. A pack year of smoking is smoking an average of 1 pack of cigarettes a day for 1 year (for example: 1 pack a  day for 30 years or 2 packs a day for 15 years). Yearly screening should continue until the smoker has stopped smoking for at least 15 years. Yearly screening should be stopped for people who develop a health problem that would prevent them from having lung cancer treatment.  If you choose to drink alcohol, do not have more than 2 drinks per day. One drink is considered to be 12 ounces (355 mL) of beer, 5 ounces (148 mL) of wine, or 1.5 ounces (44 mL) of  liquor.  Avoid use of street drugs. Do not share needles with anyone. Ask for help if you need support or instructions about stopping the use of drugs.  High blood pressure causes heart disease and increases the risk of stroke. Your blood pressure should be checked at least every 1-2 years. Ongoing high blood pressure should be treated with medicines, if weight loss and exercise are not effective.  If you are 26-82 years old, ask your health care provider if you should take aspirin to prevent heart disease.  Diabetes screening is done by taking a blood sample to check your blood glucose level after you have not eaten for a certain period of time (fasting). If you are not overweight and you do not have risk factors for diabetes, you should be screened once every 3 years starting at age 60. If you are overweight or obese and you are 63-38 years of age, you should be screened for diabetes every year as part of your cardiovascular risk assessment.  Colorectal cancer can be detected and often prevented. Most routine colorectal cancer screening begins at the age of 46 and continues through age 31. However, your health care provider may recommend screening at an earlier age if you have risk factors for colon cancer. On a yearly basis, your health care provider may provide home test kits to check for hidden blood in the stool. Use of a small camera at the end of a tube to directly examine the colon (sigmoidoscopy or colonoscopy) can detect the earliest forms of colorectal cancer. Talk to your health care provider about this at age 6, when routine screening begins. Direct exam of the colon should be repeated every 5-10 years through age 75, unless early forms of precancerous polyps or small growths are found.  People who are at an increased risk for hepatitis B should be screened for this virus. You are considered at high risk for hepatitis B if:  You were born in a country where hepatitis B occurs often. Talk  with your health care provider about which countries are considered high risk.  Your parents were born in a high-risk country and you have not received a shot to protect against hepatitis B (hepatitis B vaccine).  You have HIV or AIDS.  You use needles to inject street drugs.  You live with, or have sex with, someone who has hepatitis B.  You are a man who has sex with other men (MSM).  You get hemodialysis treatment.  You take certain medicines for conditions such as cancer, organ transplantation, and autoimmune conditions.  Hepatitis C blood testing is recommended for all people born from [birth year] through 1965 and any individual with known risks for hepatitis C.  Practice safe sex. Use condoms and avoid high-risk sexual practices to reduce the spread of sexually transmitted infections (STIs). STIs include gonorrhea, chlamydia, syphilis, trichomonas, herpes, HPV, and human immunodeficiency virus (HIV). Herpes, HIV, and HPV are viral illnesses that have no cure. They can  result in disability, cancer, and death.  If you are a man who has sex with other men, you should be screened at least once per year for:  HIV.  Urethral, rectal, and pharyngeal infection of gonorrhea, chlamydia, or both.  If you are at risk of being infected with HIV, it is recommended that you take a prescription medicine daily to prevent HIV infection. This is called preexposure prophylaxis (PrEP). You are considered at risk if:  You are a man who has sex with other men (MSM) and have other risk factors.  You are a heterosexual man, are sexually active, and are at increased risk for HIV infection.  You take drugs by injection.  You are sexually active with a partner who has HIV.  Talk with your health care provider about whether you are at high risk of being infected with HIV. If you choose to begin PrEP, you should first be tested for HIV. You should then be tested every 3 months for as long as you are taking  PrEP.  A one-time screening for abdominal aortic aneurysm (AAA) and surgical repair of large AAAs by ultrasound are recommended for men ages 82 to 5 years who are current or former smokers.  Healthy men should no longer receive prostate-specific antigen (PSA) blood tests as part of routine cancer screening. Talk with your health care provider about prostate cancer screening.  Testicular cancer screening is not recommended for adult males who have no symptoms. Screening includes self-exam, a health care provider exam, and other screening tests. Consult with your health care provider about any symptoms you have or any concerns you have about testicular cancer.  Use sunscreen. Apply sunscreen liberally and repeatedly throughout the day. You should seek shade when your shadow is shorter than you. Protect yourself by wearing long sleeves, pants, a wide-brimmed hat, and sunglasses year round, whenever you are outdoors.  Once a month, do a whole-body skin exam, using a mirror to look at the skin on your back. Tell your health care provider about new moles, moles that have irregular borders, moles that are larger than a pencil eraser, or moles that have changed in shape or color.  Stay current with required vaccines (immunizations).  Influenza vaccine. All adults should be immunized every year.  Tetanus, diphtheria, and acellular pertussis (Td, Tdap) vaccine. An adult who has not previously received Tdap or who does not know his vaccine status should receive 1 dose of Tdap. This initial dose should be followed by tetanus and diphtheria toxoids (Td) booster doses every 10 years. Adults with an unknown or incomplete history of completing a 3-dose immunization series with Td-containing vaccines should begin or complete a primary immunization series including a Tdap dose. Adults should receive a Td booster every 10 years.  Varicella vaccine. An adult without evidence of immunity to varicella should receive 2  doses or a second dose if he has previously received 1 dose.  Human papillomavirus (HPV) vaccine. Males aged 11-21 years who have not received the vaccine previously should receive the 3-dose series. Males aged 22-26 years may be immunized. Immunization is recommended through the age of 36 years for any male who has sex with males and did not get any or all doses earlier. Immunization is recommended for any person with an immunocompromised condition through the age of 37 years if he did not get any or all doses earlier. During the 3-dose series, the second dose should be obtained 4-8 weeks after the first dose. The third  dose should be obtained 24 weeks after the first dose and 16 weeks after the second dose.  Zoster vaccine. One dose is recommended for adults aged 5 years or older unless certain conditions are present.  Measles, mumps, and rubella (MMR) vaccine. Adults born before [birth year] generally are considered immune to measles and mumps. Adults born in [birth year] or later should have 1 or more doses of MMR vaccine unless there is a contraindication to the vaccine or there is laboratory evidence of immunity to each of the three diseases. A routine second dose of MMR vaccine should be obtained at least 28 days after the first dose for students attending postsecondary schools, health care workers, or international travelers. People who received inactivated measles vaccine or an unknown type of measles vaccine during 1963-1967 should receive 2 doses of MMR vaccine. People who received inactivated mumps vaccine or an unknown type of mumps vaccine before 1979 and are at high risk for mumps infection should consider immunization with 2 doses of MMR vaccine. Unvaccinated health care workers born before 65 who lack laboratory evidence of measles, mumps, or rubella immunity or laboratory confirmation of disease should consider measles and mumps immunization with 2 doses of MMR vaccine or rubella immunization with 1 dose  of MMR vaccine.  Pneumococcal 13-valent conjugate (PCV13) vaccine. When indicated, a person who is uncertain of his immunization history and has no record of immunization should receive the PCV13 vaccine. All adults 88 years of age and older should receive this vaccine. An adult aged 17 years or older who has certain medical conditions and has not been previously immunized should receive 1 dose of PCV13 vaccine. This PCV13 should be followed with a dose of pneumococcal polysaccharide (PPSV23) vaccine. Adults who are at high risk for pneumococcal disease should obtain the PPSV23 vaccine at least 8 weeks after the dose of PCV13 vaccine. Adults older than 36 years of age who have normal immune system function should obtain the PPSV23 vaccine dose at least 1 year after the dose of PCV13 vaccine.  Pneumococcal polysaccharide (PPSV23) vaccine. When PCV13 is also indicated, PCV13 should be obtained first. All adults aged 70 years and older should be immunized. An adult younger than age 70 years who has certain medical conditions should be immunized. Any person who resides in a nursing home or long-term care facility should be immunized. An adult smoker should be immunized. People with an immunocompromised condition and certain other conditions should receive both PCV13 and PPSV23 vaccines. People with human immunodeficiency virus (HIV) infection should be immunized as soon as possible after diagnosis. Immunization during chemotherapy or radiation therapy should be avoided. Routine use of PPSV23 vaccine is not recommended for American Indians, [place]atives, or people younger than 65 years unless there are medical conditions that require PPSV23 vaccine. When indicated, people who have unknown immunization and have no record of immunization should receive PPSV23 vaccine. One-time revaccination 5 years after the first dose of PPSV23 is recommended for people aged 19-64 years who have chronic kidney failure, nephrotic  syndrome, asplenia, or immunocompromised conditions. People who received 1-2 doses of PPSV23 before age 92 years should receive another dose of PPSV23 vaccine at age 64 years or later if at least 5 years have passed since the previous dose. Doses of PPSV23 are not needed for people immunized with PPSV23 at or after age 40 years.  Meningococcal vaccine. Adults with asplenia or persistent complement component deficiencies should receive 2 doses of quadrivalent meningococcal conjugate (MenACWY-D) vaccine. The doses  should be obtained at least 2 months apart. Microbiologists working with certain meningococcal bacteria, [place] recruits, people at risk during an outbreak, and people who travel to or live in countries with a high rate of meningitis should be immunized. A first-year college student up through age 82 years who is living in a residence hall should receive a dose if he did not receive a dose on or after his 16th birthday. Adults who have certain high-risk conditions should receive one or more doses of vaccine.  Hepatitis A vaccine. Adults who wish to be protected from this disease, have chronic liver disease, work with hepatitis A-infected animals, work in hepatitis A research labs, or travel to or work in countries with a high rate of hepatitis A should be immunized. Adults who were previously unvaccinated and who anticipate close contact with an international adoptee during the first 60 days after arrival in the [place] from a country with a high rate of hepatitis A should be immunized.  Hepatitis B vaccine. Adults should be immunized if they wish to be protected from this disease, are under age 26 years and have diabetes, have chronic liver disease, have had more than one sex partner in the past 6 months, may be exposed to blood or other infectious body fluids, are household contacts or sex partners of hepatitis B positive people, are clients or workers in certain care facilities, or travel to  or work in countries with a high rate of hepatitis B.  Haemophilus influenzae type b (Hib) vaccine. A previously unvaccinated person with asplenia or sickle cell disease or having a scheduled splenectomy should receive 1 dose of Hib vaccine. Regardless of previous immunization, a recipient of a hematopoietic stem cell transplant should receive a 3-dose series 6-12 months after his successful transplant. Hib vaccine is not recommended for adults with HIV infection. Preventive Service / Frequency Ages 68 to 77  Blood pressure check.** / Every 3-5 years.  Lipid and cholesterol check.** / Every 5 years beginning at age 38.  Hepatitis C blood test.** / For any individual with known risks for hepatitis C.  Skin self-exam. / Monthly.  Influenza vaccine. / Every year.  Tetanus, diphtheria, and acellular pertussis (Tdap, Td) vaccine.** / Consult your health care provider. 1 dose of Td every 10 years.  Varicella vaccine.** / Consult your health care provider.  HPV vaccine. / 3 doses over 6 months, if 57 or younger.  Measles, mumps, rubella (MMR) vaccine.** / You need at least 1 dose of MMR if you were born in 1957 or later. You may also need a second dose.  Pneumococcal 13-valent conjugate (PCV13) vaccine.** / Consult your health care provider.  Pneumococcal polysaccharide (PPSV23) vaccine.** / 1 to 2 doses if you smoke cigarettes or if you have certain conditions.  Meningococcal vaccine.** / 1 dose if you are age 72 to 50 years and a Market researcher living in a residence hall, or have one of several medical conditions. You may also need additional booster doses.  Hepatitis A vaccine.** / Consult your health care provider.  Hepatitis B vaccine.** / Consult your health care provider.  Haemophilus influenzae type b (Hib) vaccine.** / Consult your health care provider. Ages 71 to 87  Blood pressure check.** / Every year.  Lipid and cholesterol check.** / Every 5 years beginning  at age 32.  Lung cancer screening. / Every year if you are aged 71-80 years and have a 30-pack-year history of smoking and currently smoke or have quit  within the past 15 years. Yearly screening is stopped once you have quit smoking for at least 15 years or develop a health problem that would prevent you from having lung cancer treatment.  Fecal occult blood test (FOBT) of stool. / Every year beginning at age 81 and continuing until age 76. You may not have to do this test if you get a colonoscopy every 10 years.  Flexible sigmoidoscopy** or colonoscopy.** / Every 5 years for a flexible sigmoidoscopy or every 10 years for a colonoscopy beginning at age 42 and continuing until age 36.  Hepatitis C blood test.** / For all people born from [birth year] through 1965 and any individual with known risks for hepatitis C.  Skin self-exam. / Monthly.  Influenza vaccine. / Every year.  Tetanus, diphtheria, and acellular pertussis (Tdap/Td) vaccine.** / Consult your health care provider. 1 dose of Td every 10 years.  Varicella vaccine.** / Consult your health care provider.  Zoster vaccine.** / 1 dose for adults aged 14 years or older.  Measles, mumps, rubella (MMR) vaccine.** / You need at least 1 dose of MMR if you were born in 1958 or later. You may also need a second dose.  Pneumococcal 13-valent conjugate (PCV13) vaccine.** / Consult your health care provider.  Pneumococcal polysaccharide (PPSV23) vaccine.** / 1 to 2 doses if you smoke cigarettes or if you have certain conditions.  Meningococcal vaccine.** / Consult your health care provider.  Hepatitis A vaccine.** / Consult your health care provider.  Hepatitis B vaccine.** / Consult your health care provider.  Haemophilus influenzae type b (Hib) vaccine.** / Consult your health care provider. Ages 55 and over  Blood pressure check.** / Every year.  Lipid and cholesterol check.**/ Every 5 years beginning at age 64.  Lung cancer screening.  / Every year if you are aged 15-80 years and have a 30-pack-year history of smoking and currently smoke or have quit within the past 15 years. Yearly screening is stopped once you have quit smoking for at least 15 years or develop a health problem that would prevent you from having lung cancer treatment.  Fecal occult blood test (FOBT) of stool. / Every year beginning at age 50 and continuing until age 44. You may not have to do this test if you get a colonoscopy every 10 years.  Flexible sigmoidoscopy** or colonoscopy.** / Every 5 years for a flexible sigmoidoscopy or every 10 years for a colonoscopy beginning at age 73 and continuing until age 72.  Hepatitis C blood test.** / For all people born from [birth year] through 1966 and any individual with known risks for hepatitis C.  Abdominal aortic aneurysm (AAA) screening.** / A one-time screening for ages 36 to 53 years who are current or former smokers.  Skin self-exam. / Monthly.  Influenza vaccine. / Every year.  Tetanus, diphtheria, and acellular pertussis (Tdap/Td) vaccine.** / 1 dose of Td every 10 years.  Varicella vaccine.** / Consult your health care provider.  Zoster vaccine.** / 1 dose for adults aged 26 years or older.  Pneumococcal 13-valent conjugate (PCV13) vaccine.** / 1 dose for all adults aged 40 years and older.  Pneumococcal polysaccharide (PPSV23) vaccine.** / 1 dose for all adults aged 30 years and older.  Meningococcal vaccine.** / Consult your health care provider.  Hepatitis A vaccine.** / Consult your health care provider.  Hepatitis B vaccine.** / Consult your health care provider.  Haemophilus influenzae type b (Hib) vaccine.** / Consult your health care provider. **Family history and personal  history of risk and conditions may change your health care provider's recommendations.   This information is not intended to replace advice given to you by your health care provider. Make sure you discuss any questions you  have with your health care provider.   Document Released: 12/07/2001 Document Revised: 11/01/2014 Document Reviewed: 03/08/2011 Elsevier Interactive Patient Education [employer].

## 2015-12-22 NOTE — Assessment & Plan Note (Signed)
Worsened symptoms. Will stop Nexium and begin Protonix. Increase fluids. GERD diet reviewed. Will check labs today to include h. Pylori testing.

## 2015-12-22 NOTE — Assessment & Plan Note (Signed)
With increase in anxiety. Will add on Trazodone 25 mg nightly. Continue Sertraline. Follow-up 1 month.

## 2015-12-22 NOTE — Progress Notes (Signed)
Patient presents to clinic today for annual exam.  Patient is fasting for labs.  Acute Concerns: Patient endorses flare-up of GERD recently, not relieved with OTC nexium. Endorses some mild epigastric pain that is intermittent and not associated with meals. Denies nausea, vomiting or change to stools. Denies recent NSAID use or alcohol consumption.   Chronic Issues: Anxiety -- Endorses increased anxiety with new job. Patient endorses he has increased sertraline to 100 mg nightly which helps with anxiety. Is not sleeping well which is affecting his mood. Denies depressed mood or anhedonia.   Health Maintenance: Immunizations -- Declines flu shot. TDaP up-to-date.  Past Medical History  Diagnosis Date  . Hypercholesteremia   . Anxiety   . GERD (gastroesophageal reflux disease)   . Arthritis   . Chest pain around 12-02-2014    Past Surgical History  Procedure Laterality Date  . Elbow fracture surgery Right 07.16.14    Radial Repair  . Vasectomy    . Vasectomy reversal    . Leg surgery Right     lower, for fracture age 89  . Inguinal hernia repair Right 01/06/2015    Procedure: LAPAROSCOPIC REPAIR OF RIGHT INGUINAL HERNIA WITH MESH;  Surgeon: Greer Pickerel, MD;  Location: [facility];  Service: General;  Laterality: Right;  . Insertion of mesh Right 01/06/2015    Procedure: INSERTION OF MESH;  Surgeon: Greer Pickerel, MD;  Location: [facility];  Service: General;  Laterality: Right;    Current Outpatient Prescriptions on File Prior to Visit  Medication Sig Dispense Refill  . ALPRAZolam (XANAX) 0.5 MG tablet Take up to twice daily as needed for breakthrough anxiety 60 tablet 0  . atorvastatin (LIPITOR) 20 MG tablet TAKE 1 TABLET (20 MG TOTAL) BY MOUTH DAILY. 30 tablet 0  . esomeprazole (NEXIUM) 20 MG capsule Take 20 mg by mouth daily at 12 noon.    . fenofibrate 160 MG tablet TAKE 1 TABLET (160 MG TOTAL) BY MOUTH DAILY. 30 tablet 0  . ibuprofen (ADVIL,MOTRIN) 200 MG tablet Take 200 mg by mouth  every 6 (six) hours as needed for mild pain or moderate pain.    Marland Kitchen sertraline (ZOLOFT) 100 MG tablet TAKE 1/2 (50 mg) TABLET BY MOUTH DAILY.     No current facility-administered medications on file prior to visit.    No Known Allergies  Family History  Problem Relation Age of Onset  . Hypertension Father     Living  . Non-Hodgkin's lymphoma Father   . Diabetes Father   . Healthy Mother     Living  . Hypertension Other     Paternal side  . Heart attack Paternal Grandfather   . Healthy Sister     x1  . Healthy Daughter     x3    Social History   Social History  . Marital Status: Married    Spouse Name: N/A  . Number of Children: N/A  . Years of Education: N/A   Occupational History  . Not on file.   Social History Main Topics  . Smoking status: Current Every Day Smoker -- 0.25 packs/day for 5 years  . Smokeless tobacco: Never Used  . Alcohol Use: 3.6 oz/week    6 Cans of beer per week     Comment: occasional  . Drug Use: No  . Sexual Activity: Yes    Birth Control/ Protection: None   Other Topics Concern  . Not on file   Social History Narrative   Review of Systems  Constitutional: Negative for fever and weight loss.  HENT: Negative for ear discharge, ear pain, hearing loss and tinnitus.   Eyes: Negative for blurred vision, double vision, photophobia and pain.  Respiratory: Negative for cough and shortness of breath.   Cardiovascular: Negative for chest pain and palpitations.  Gastrointestinal: Positive for heartburn. Negative for nausea, vomiting, abdominal pain, diarrhea, constipation, blood in stool and melena.  Genitourinary: Negative for dysuria, urgency, frequency, hematuria and flank pain.  Musculoskeletal: Negative for falls.  Neurological: Negative for dizziness, loss of consciousness and headaches.  Endo/Heme/Allergies: Negative for environmental allergies.  Psychiatric/Behavioral: Negative for depression, suicidal ideas, hallucinations and  substance abuse. The patient is nervous/anxious and has insomnia.    BP 116/80 mmHg  Pulse 63  Temp(Src) 98.2 F (36.8 C) (Oral)  Ht '5\' 9"'  (1.753 m)  Wt 196 lb (88.905 kg)  BMI 28.93 kg/m2  SpO2 99%  Physical Exam  Constitutional: He is oriented to person, place, and time and well-developed, well-nourished, and in no distress.  HENT:  Head: Normocephalic and atraumatic.  Right Ear: External ear normal.  Left Ear: External ear normal.  Nose: Nose normal.  Mouth/Throat: Oropharynx is clear and moist. No oropharyngeal exudate.  Eyes: Conjunctivae and EOM are normal. Pupils are equal, round, and reactive to light.  Neck: Neck supple. No thyromegaly present.  Cardiovascular: Normal rate, regular rhythm, normal heart sounds and intact distal pulses.   Pulmonary/Chest: Effort normal and breath sounds normal. No respiratory distress. He has no wheezes. He has no rales. He exhibits no tenderness.  Abdominal: Soft. Bowel sounds are normal. He exhibits no distension and no mass. There is no tenderness. There is no rebound and no guarding.  Genitourinary: Testes/scrotum normal.  Lymphadenopathy:    He has no cervical adenopathy.  Neurological: He is alert and oriented to person, place, and time.  Skin: Skin is warm and dry. No rash noted.  Psychiatric: Affect normal.  Vitals reviewed.   Recent Results (from the past 2160 hour(s))  CBC     Status: None   Collection Time: 12/22/15  8:22 AM  Result Value Ref Range   WBC 7.2 4.0 - 10.5 K/uL   RBC 5.05 4.22 - 5.81 Mil/uL   Platelets 173.0 150.0 - 400.0 K/uL   Hemoglobin 15.1 13.0 - 17.0 g/dL   HCT 43.5 39.0 - 52.0 %   MCV 86.1 78.0 - 100.0 fl   MCHC 34.6 30.0 - 36.0 g/dL   RDW 13.4 11.5 - 15.5 %  Comp Met (CMET)     Status: Abnormal   Collection Time: 12/22/15  8:22 AM  Result Value Ref Range   Sodium 136 135 - 145 mEq/L   Potassium 3.9 3.5 - 5.1 mEq/L   Chloride 102 96 - 112 mEq/L   CO2 30 19 - 32 mEq/L   Glucose, Bld 110 (H) 70 -  99 mg/dL   BUN 19 6 - 23 mg/dL   Creatinine, Ser 1.18 0.40 - 1.50 mg/dL   Total Bilirubin 0.6 0.2 - 1.2 mg/dL   Alkaline Phosphatase 44 39 - 117 U/L   AST 14 0 - 37 U/L   ALT 16 0 - 53 U/L   Total Protein 7.3 6.0 - 8.3 g/dL   Albumin 4.5 3.5 - 5.2 g/dL   Calcium 9.5 8.4 - 10.5 mg/dL   GFR 74.31 >60.00 mL/min  TSH     Status: None   Collection Time: 12/22/15  8:22 AM  Result Value Ref Range   TSH 0.71  0.35 - 4.50 uIU/mL  Hemoglobin A1c     Status: None   Collection Time: 12/22/15  8:22 AM  Result Value Ref Range   Hgb A1c MFr Bld 5.5 4.6 - 6.5 %    Comment: Glycemic Control Guidelines for People with Diabetes:Non Diabetic:  <6%Goal of Therapy: <7%Additional Action Suggested:  >8%   Urinalysis, Routine w reflex microscopic     Status: Abnormal   Collection Time: 12/22/15  8:22 AM  Result Value Ref Range   Color, Urine YELLOW Yellow;Lt. Yellow   APPearance CLEAR Clear   Specific Gravity, Urine >=1.030 (A) 1.000 - 1.030   pH 5.0 5.0 - 8.0   Total Protein, Urine NEGATIVE Negative   Urine Glucose NEGATIVE Negative   Ketones, ur NEGATIVE Negative   Bilirubin Urine NEGATIVE Negative   Hgb urine dipstick NEGATIVE Negative   Urobilinogen, UA 0.2 0.0 - 1.0   Leukocytes, UA NEGATIVE Negative   Nitrite NEGATIVE Negative   WBC, UA 0-2/hpf 0-2/hpf   RBC / HPF none seen 0-2/hpf   Mucus, UA Presence of (A) None  Lipid Profile     Status: Abnormal   Collection Time: 12/22/15  8:22 AM  Result Value Ref Range   Cholesterol 205 (H) 0 - 200 mg/dL    Comment: ATP III Classification       Desirable:  < 200 mg/dL               Borderline High:  200 - 239 mg/dL          High:  > = 240 mg/dL   Triglycerides 194.0 (H) 0.0 - 149.0 mg/dL    Comment: Normal:  <150 mg/dLBorderline High:  150 - 199 mg/dL   HDL 34.40 (L) >39.00 mg/dL   VLDL 38.8 0.0 - 40.0 mg/dL   LDL Cholesterol 132 (H) 0 - 99 mg/dL   Total CHOL/HDL Ratio 6     Comment:                Men          Women1/2 Average Risk     3.4           3.3Average Risk          5.0          4.42X Average Risk          9.6          7.13X Average Risk          15.0          11.0                       NonHDL 170.56     Comment: NOTE:  Non-HDL goal should be 30 mg/dL higher than patient's LDL goal (i.e. LDL goal of < 70 mg/dL, would have non-HDL goal of < 100 mg/dL)  H. pylori antibody, IgG     Status: None   Collection Time: 12/22/15  8:22 AM  Result Value Ref Range   H Pylori IgG Negative Negative    Assessment/Plan: Visit for preventive health examination Depression screen negative. Health Maintenance reviewed -- Tetanus up-to-date. Declines flu shot. Preventive schedule discussed and handout given in AVS. Will obtain fasting labs today.   Insomnia With increase in anxiety. Will add on Trazodone 25 mg nightly. Continue Sertraline. Follow-up 1 month.  GERD (gastroesophageal reflux disease) Worsened symptoms. Will stop Nexium and begin Protonix. Increase fluids. GERD diet reviewed. Will check  labs today to include h. Pylori testing.

## 2015-12-22 NOTE — Progress Notes (Signed)
Pre visit review using our clinic review tool, if applicable. No additional management support is needed unless otherwise documented below in the visit note. 

## 2015-12-22 NOTE — Assessment & Plan Note (Signed)
Depression screen negative. Health Maintenance reviewed -- Tetanus up-to-date. Declines flu shot. Preventive schedule discussed and handout given in AVS. Will obtain fasting labs today.  

## 2015-12-23 ENCOUNTER — Other Ambulatory Visit: Payer: Self-pay | Admitting: Physician Assistant

## 2015-12-23 DIAGNOSIS — F329 Major depressive disorder, single episode, unspecified: Secondary | ICD-10-CM

## 2015-12-23 DIAGNOSIS — F32A Depression, unspecified: Secondary | ICD-10-CM

## 2015-12-23 DIAGNOSIS — F419 Anxiety disorder, unspecified: Principal | ICD-10-CM

## 2015-12-23 MED ORDER — SERTRALINE HCL 100 MG PO TABS: ORAL_TABLET | ORAL | Status: DC

## 2015-12-23 MED ORDER — FENOFIBRATE 160 MG PO TABS: ORAL_TABLET | ORAL | Status: DC

## 2015-12-23 MED ORDER — ATORVASTATIN CALCIUM 40 MG PO TABS: 40.0000 mg | ORAL_TABLET | Freq: Every day | ORAL | Status: DC

## 2015-12-23 MED ORDER — ALPRAZOLAM 0.5 MG PO TABS: ORAL_TABLET | ORAL | Status: DC

## 2015-12-23 MED FILL — ATORVASTATIN 40 MG TABLET: 40 | 30 days supply | Qty: 30 | Fill #0

## 2015-12-23 MED FILL — PANTOPRAZOLE SOD DR 40 MG T: 40 | 30 days supply | Qty: 30 | Fill #0

## 2015-12-23 MED FILL — SERTRALINE HCL 100 MG TAB: 100 | 30 days supply | Qty: 30 | Fill #0

## 2015-12-23 MED FILL — traZODone HCL 50 MG TABS: 50 | 30 days supply | Qty: 15 | Fill #0

## 2015-12-23 MED FILL — ALPRAZolam 0.5 MG TABS: 0.5 | 30 days supply | Qty: 60 | Fill #0

## 2015-12-23 MED FILL — FENOFIBRATE 160 MG TABLET: 160 | 30 days supply | Qty: 30 | Fill #0

## 2015-12-23 NOTE — Telephone Encounter (Signed)
Faxed hardcopy for Alprazolam to [facility]

## 2016-01-20 ENCOUNTER — Ambulatory Visit: Payer: BLUE CROSS/BLUE SHIELD | Admitting: Physician Assistant

## 2016-01-20 ENCOUNTER — Encounter: Payer: Self-pay | Admitting: Gastroenterology

## 2016-01-21 ENCOUNTER — Ambulatory Visit: Payer: BLUE CROSS/BLUE SHIELD | Admitting: Physician Assistant

## 2016-01-22 MED FILL — SERTRALINE HCL 100 MG TAB: 100 | 30 days supply | Qty: 30 | Fill #1

## 2016-01-22 MED FILL — FENOFIBRATE 160 MG TABLET: 160 | 30 days supply | Qty: 30 | Fill #1

## 2016-01-22 MED FILL — traZODone HCL 50 MG TABS: 50 | 30 days supply | Qty: 15 | Fill #1

## 2016-02-23 MED FILL — SERTRALINE HCL 100 MG TAB: 100 | 30 days supply | Qty: 30 | Fill #2

## 2016-02-23 MED FILL — FENOFIBRATE 160 MG TABLET: 160 | 30 days supply | Qty: 30 | Fill #2

## 2016-02-23 MED FILL — traZODone HCL 50 MG TABS: 50 | 30 days supply | Qty: 15 | Fill #2

## 2016-03-02 DIAGNOSIS — M7712 Lateral epicondylitis, left elbow: Secondary | ICD-10-CM | POA: Diagnosis not present

## 2016-03-18 ENCOUNTER — Ambulatory Visit (INDEPENDENT_AMBULATORY_CARE_PROVIDER_SITE_OTHER): Payer: 59 | Admitting: Family Medicine

## 2016-03-18 ENCOUNTER — Encounter: Payer: Self-pay | Admitting: Family Medicine

## 2016-03-18 VITALS — BP 110/85 | HR 65 | Temp 97.7°F | Ht 69.0 in | Wt 192.8 lb

## 2016-03-18 DIAGNOSIS — H5203 Hypermetropia, bilateral: Secondary | ICD-10-CM | POA: Diagnosis not present

## 2016-03-18 DIAGNOSIS — R51 Headache: Secondary | ICD-10-CM

## 2016-03-18 DIAGNOSIS — R519 Headache, unspecified: Secondary | ICD-10-CM

## 2016-03-18 DIAGNOSIS — R41840 Attention and concentration deficit: Secondary | ICD-10-CM | POA: Diagnosis not present

## 2016-03-18 LAB — VITAMIN D 25 HYDROXY (VIT D DEFICIENCY, FRACTURES): VITD: 25.94 ng/mL — ABNORMAL LOW (ref 30.00–100.00)

## 2016-03-18 NOTE — Progress Notes (Signed)
Pre visit review using our clinic review tool, if applicable. No additional management support is needed unless otherwise documented below in the visit note. 

## 2016-03-18 NOTE — Progress Notes (Addendum)
[place] [facility] [address], [place], [place] [address] [phone]  Date:  03/18/2016   Name:  Jerry Lopez   DOB:  September 11, 1981   MRN:  [id number]  PCP:  Leeanne Rio, PA-C    Chief Complaint: Head Pressure   History of Present Illness:  Jerry Lopez is a 35 y.o. very pleasant male patient who presents with the following:  He has noted a feeling of pressure in the crown of his head off an on for about one month.  It is not painful He will sometimes feel lightheaded. He may feel "fuzzy headed like I can't concentrate" when it happens. He will also have occasional floaters.  No vision change.  No flashing lights.  No hearing changes.  No nausea or vomiting.   The top of his head may tingle but he does not otherwise have any numbness, weakness, slurred speech  He did start trazodone 25 mg at bedtime a couple of months ago- this does help him sleep He did quit smoking 11 weeks ago and he is using a nicotine patch; he is following a tapering dosage of this He has also been working on losing weight and getting into better shape over the last few months  He states that he is "under stress all the time," but this has not changed.  He has 4 children (2 different marriages) ranging from 34 months to 56 years old.   He uses zoloft, 0.5 mg of zanax on occasion (every couple of weeks) He is sleeping pretty well He feels like if he tries to not take the trazodone he cannot sleep due to "my wheels turning all night."    Admits he does not take the lipitor regularly.    He did have an eye exam as a teen but has not gone back since.    Patient Active Problem List   Diagnosis Date Noted  . Lateral epicondylitis (tennis elbow) 09/08/2015  . Pain of left scapula 05/07/2015  . Chronic pain of right inguinal region 10/08/2014  . Inguinal pain 07/23/2014  . Low back pain with radiation 07/23/2014  . Visit for preventive  health examination 05/23/2014  . Hyperlipidemia 05/23/2014  . Insomnia 11/15/2013  . Migraine 11/06/2013  . Encounter for preventive health examination 08/08/2013  . Anxiety and depression 08/08/2013  . GERD (gastroesophageal reflux disease) 08/08/2013  . Other and unspecified hyperlipidemia 08/08/2013  . Needs smoking cessation education 08/08/2013  . CONSTIPATION 09/23/2008  . CHEST PAIN UNSPECIFIED 09/23/2008    Past Medical History  Diagnosis Date  . Hypercholesteremia   . Anxiety   . GERD (gastroesophageal reflux disease)   . Arthritis   . Chest pain around 12-02-2014    Past Surgical History  Procedure Laterality Date  . Elbow fracture surgery Right 07.16.14    Radial Repair  . Vasectomy    . Vasectomy reversal    . Leg surgery Right     lower, for fracture age 31  . Inguinal hernia repair Right 01/06/2015    Procedure: LAPAROSCOPIC REPAIR OF RIGHT INGUINAL HERNIA WITH MESH;  Surgeon: Greer Pickerel, MD;  Location: [facility];  Service: General;  Laterality: Right;  . Insertion of mesh Right 01/06/2015    Procedure: INSERTION OF MESH;  Surgeon: Greer Pickerel, MD;  Location: [facility];  Service: General;  Laterality: Right;    Social History  Substance Use Topics  . Smoking status: Former Smoker --  0.25 packs/day for 5 years    Quit date: 12/26/2015  . Smokeless tobacco: Never Used  . Alcohol Use: 3.6 oz/week    6 Cans of beer per week     Comment: occasional    Family History  Problem Relation Age of Onset  . Hypertension Father     Living  . Non-Hodgkin's lymphoma Father   . Diabetes Father   . Healthy Mother     Living  . Hypertension Other     Paternal side  . Heart attack Paternal Grandfather   . Healthy Sister     x1  . Healthy Daughter     x3    No Known Allergies  Medication list has been reviewed and updated.  Current Outpatient Prescriptions on File Prior to Visit  Medication Sig Dispense Refill  . ALPRAZolam (XANAX) 0.5 MG tablet Take up to twice  daily as needed for breakthrough anxiety 60 tablet 0  . atorvastatin (LIPITOR) 40 MG tablet Take 1 tablet (40 mg total) by mouth daily. 30 tablet 5  . esomeprazole (NEXIUM) 20 MG capsule Take 20 mg by mouth daily at 12 noon.    . fenofibrate 160 MG tablet TAKE 1 TABLET (160 MG TOTAL) BY MOUTH DAILY. 30 tablet 5  . ibuprofen (ADVIL,MOTRIN) 200 MG tablet Take 200 mg by mouth every 6 (six) hours as needed for mild pain or moderate pain.    . pantoprazole (PROTONIX) 40 MG tablet Take 1 tablet (40 mg total) by mouth daily. 30 tablet 3  . sertraline (ZOLOFT) 100 MG tablet Take 1 tablet PO daily 30 tablet 5  . traZODone (DESYREL) 50 MG tablet Take 0.5 tablets (25 mg total) by mouth at bedtime. 30 tablet 3   No current facility-administered medications on file prior to visit.    Review of Systems:  As per HPI- otherwise negative.  Physical Examination: Filed Vitals:   03/18/16 0948  BP: 110/85  Pulse: 65  Temp: 97.7 F (36.5 C)   Ideal Body Weight:    GEN: WDWN, NAD, Non-toxic, A & O x 3, mild overweight, looks well HEENT: Atraumatic, Normocephalic. Neck supple. No masses, No LAD.  Bilateral TM wnl, oropharynx normal.  PEERL,EOMI.   Ears and Nose: No external deformity. CV: RRR, No M/G/R. No JVD. No thrill. No extra heart sounds. PULM: CTA B, no wheezes, crackles, rhonchi. No retractions. No resp. distress. No accessory muscle use. ABD: S, NT, ND. No rebound. No HSM. EXTR: No c/c/e NEURO Normal gait. Normal complete neuro exam including strength, sensation and DTR of all extremities, normal facial movement and sensation, negative romberg PSYCH: Normally interactive. Conversant. Not depressed or anxious appearing.  Calm demeanor.  No pain with pressing on his head, no scalp lesions or swelling  Assessment and Plan: Disturbed concentration - Plan: Vitamin D (25 hydroxy)  Pressure in head - Plan: Vitamin D (25 hydroxy)  Here today with non- specific sx of pressure in his head and  feeling like he cannot concentrate.   Will check a vitamin D and asked him to get an eye exam.  If all is ok there will refer to neurology   Signed Lamar Blinks, MD  Results for orders placed or performed in visit on 03/18/16  Vitamin D (25 hydroxy)  Result Value Ref Range   VITD 25.94 (L) 30.00 - 100.00 ng/mL    Sent message to him on 5/26- start OTC vitamin D Will refer to neurology

## 2016-03-18 NOTE — Patient Instructions (Addendum)
Please get an eye exam- we will also check your vitamin D level.  Assuming these things are ok, I will refer you to a neurologist for evaluation  If you are not comfortable taking the lipitor, I would recommend that you start on an OTC omega 3 supplement.  Also, exercise and certain diet changes can help to raise your HDL- please see hand out. Great job with weight loss!  Let's plan to repeat a cholesterol for you in about 4-6 months   Wt Readings from Last 3 Encounters:  03/18/16 192 lb 12.8 oz (87.454 kg)  12/22/15 196 lb (88.905 kg)  09/08/15 198 lb 8 oz (90.039 kg)

## 2016-03-19 ENCOUNTER — Encounter: Payer: Self-pay | Admitting: Family Medicine

## 2016-03-19 NOTE — Addendum Note (Signed)
Addended by: Lamar Blinks C on: 03/19/2016 12:42 PM   Modules accepted: Orders

## 2016-04-02 MED FILL — SERTRALINE HCL 100 MG TAB: 100 | 30 days supply | Qty: 30 | Fill #3

## 2016-04-02 MED FILL — FENOFIBRATE 160 MG TABLET: 160 | 30 days supply | Qty: 30 | Fill #3

## 2016-04-02 MED FILL — traZODone HCL 50 MG TABS: 50 | 30 days supply | Qty: 15 | Fill #3

## 2016-04-07 IMAGING — DX DG CHEST 2V
2 series · 2 of 2 positions shown · non-contrast
Comparison: None.

CLINICAL DATA: Pain in the medial left scapular region for several
months, increased recently.

EXAM:
CHEST  2 VIEW

[chest pa]
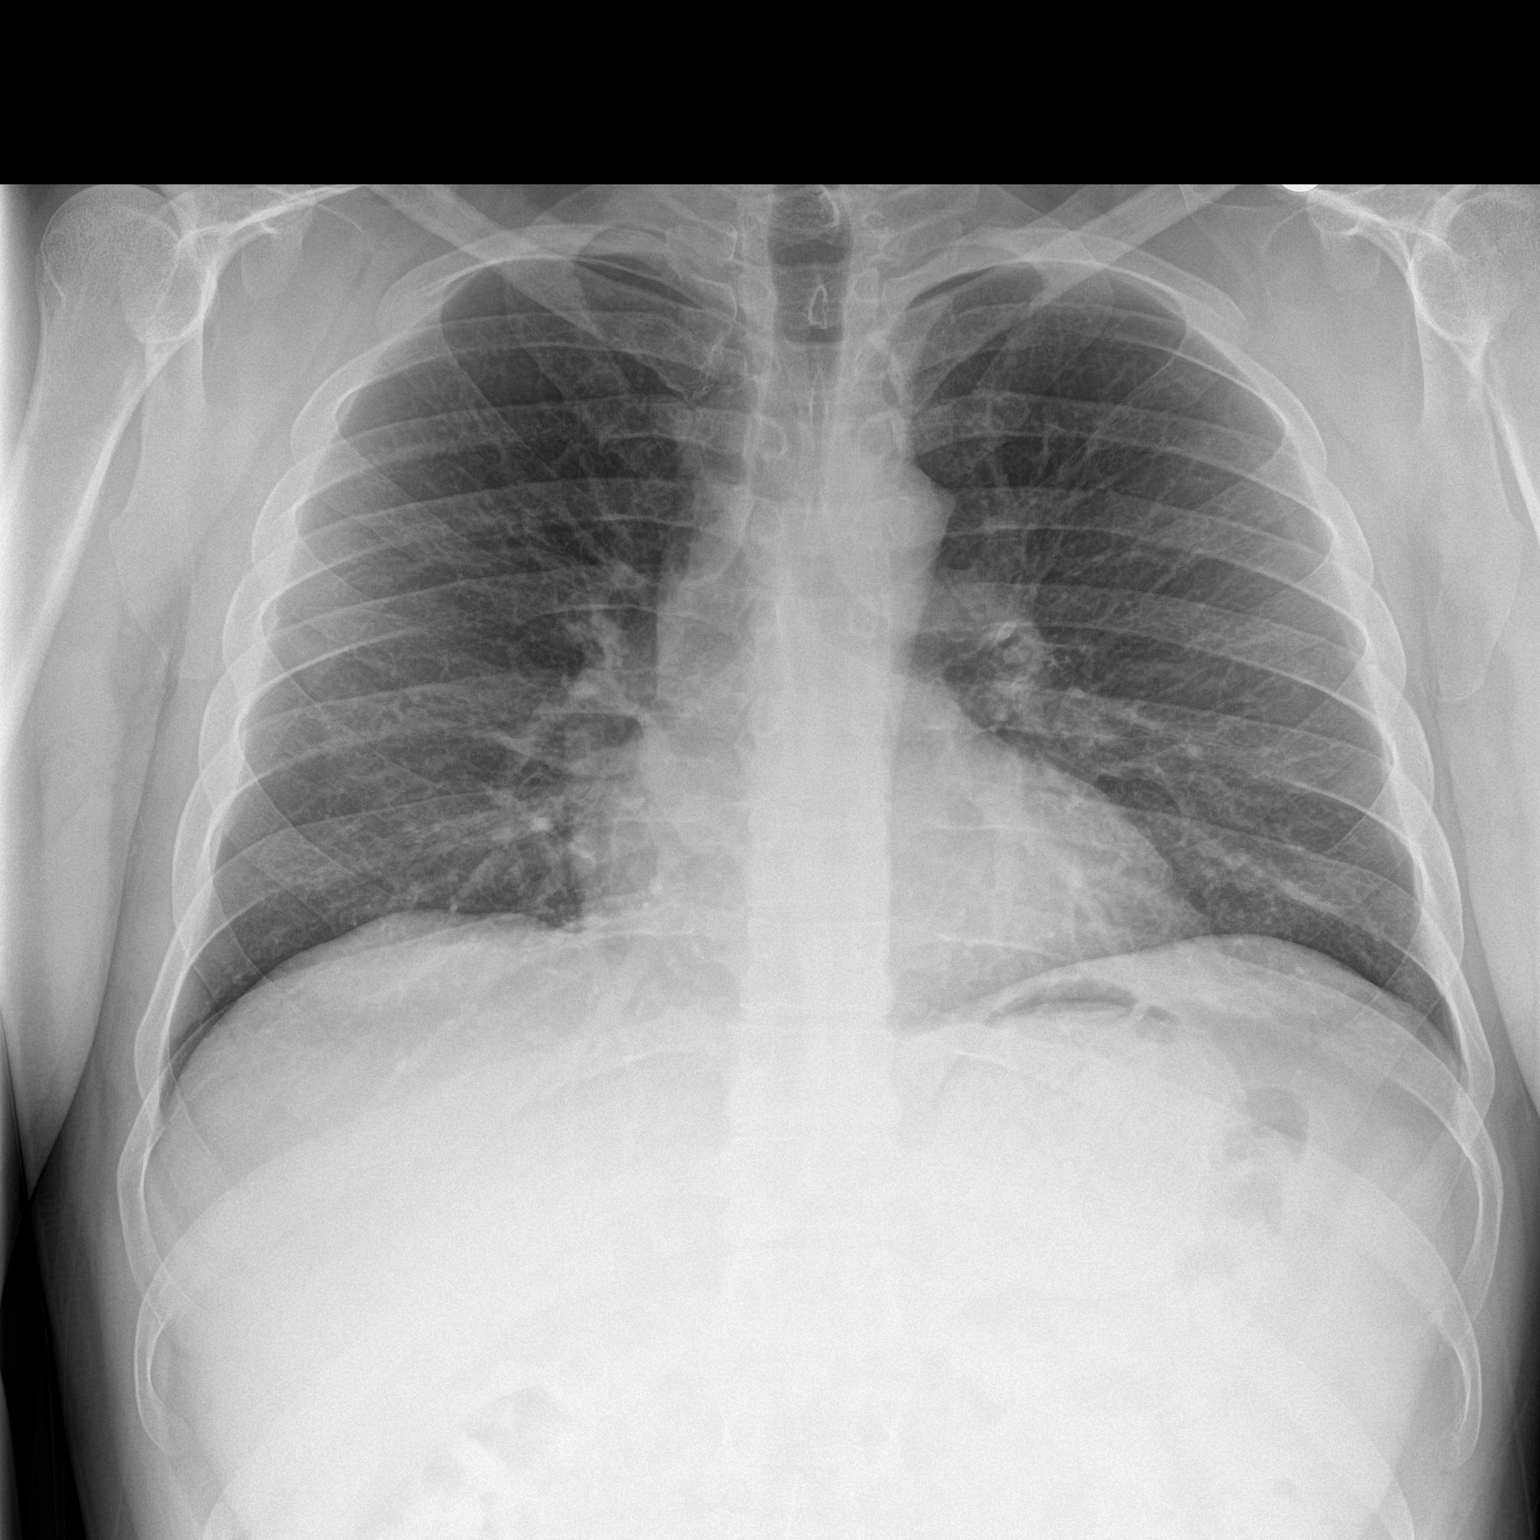

[chest lat]
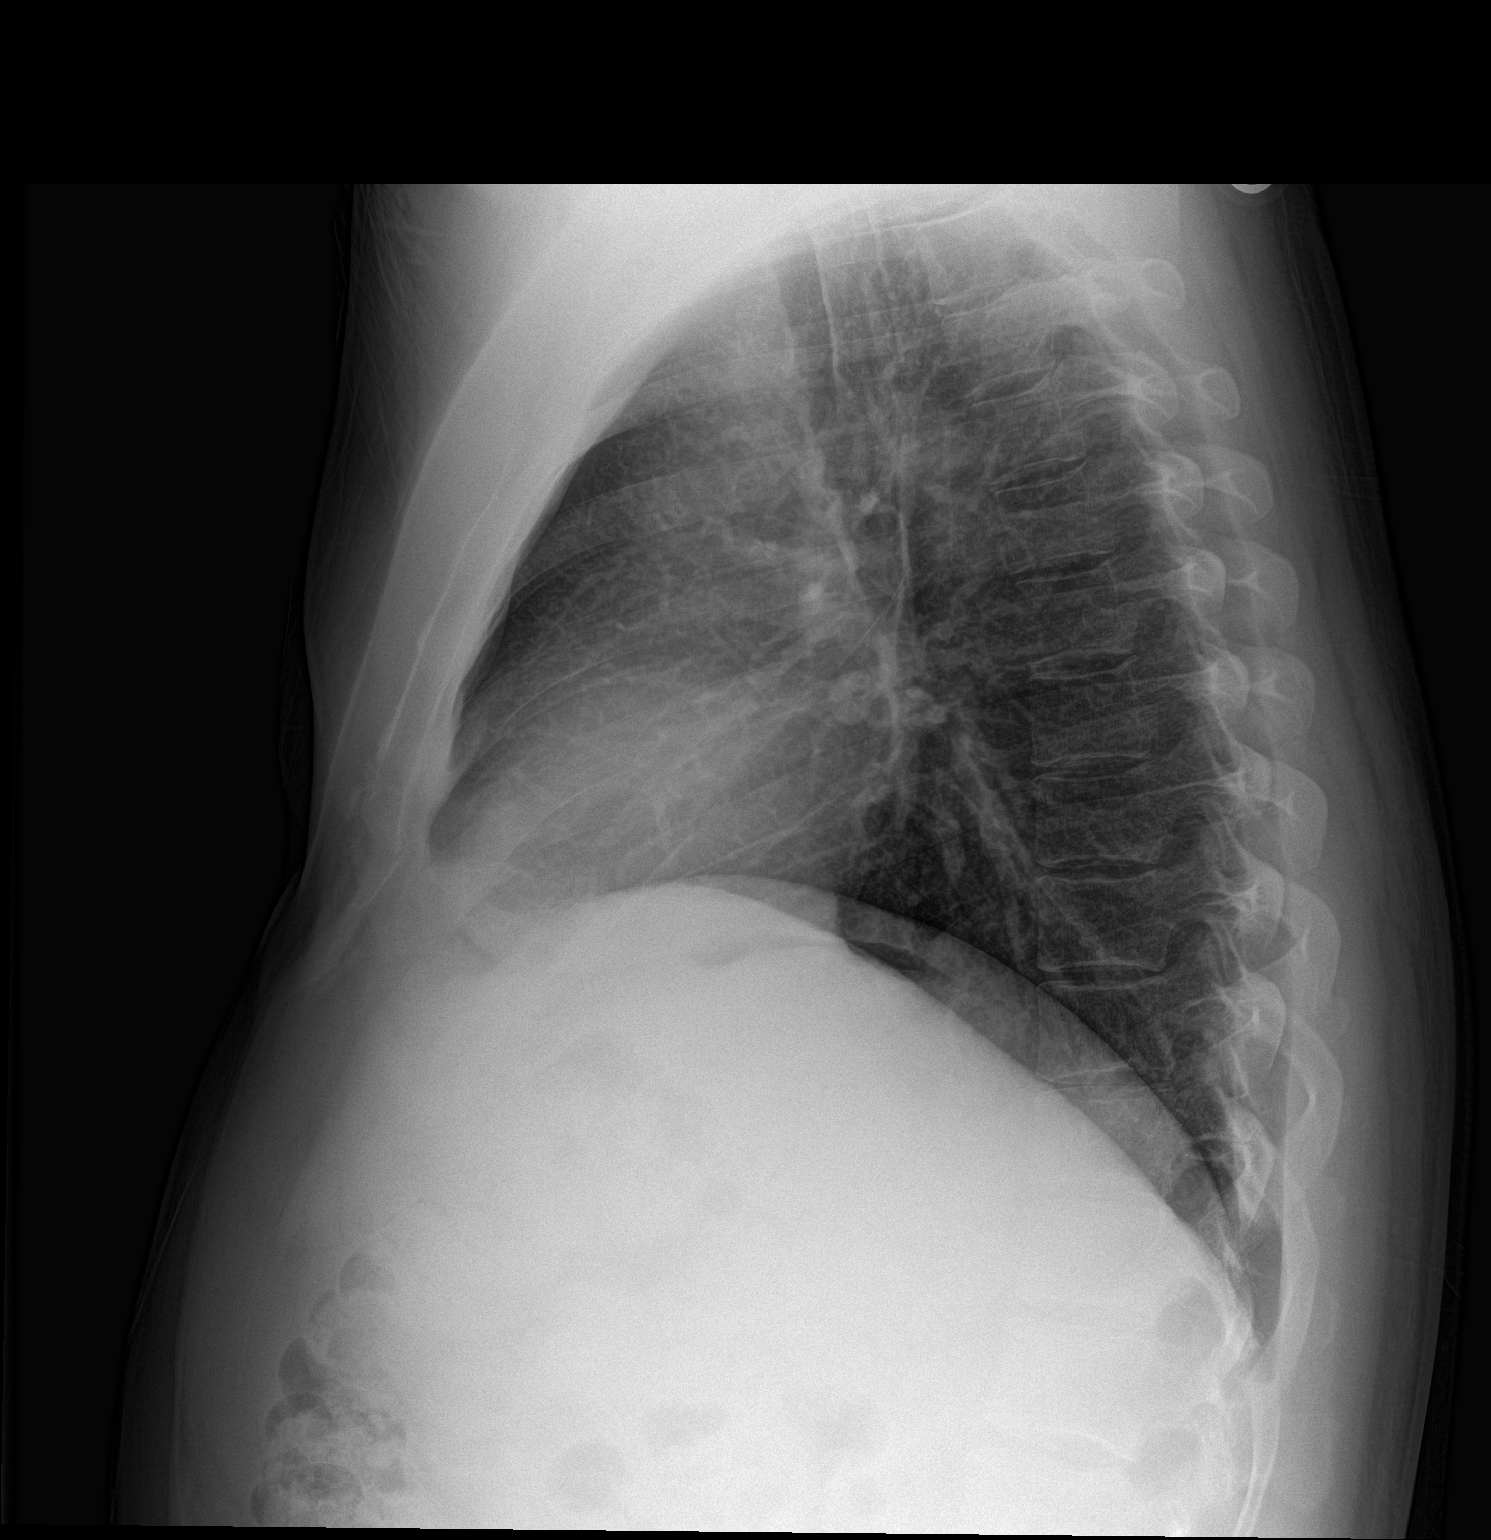

[2 of 2 positions shown; findings below may reference images not displayed]

FINDINGS: Lungs are clear. Heart size and pulmonary vascularity are normal. No
adenopathy. No pneumothorax. No bone lesions.
IMPRESSION: No abnormality noted.

## 2016-05-19 ENCOUNTER — Ambulatory Visit: Payer: 59 | Admitting: Neurology

## 2016-06-21 MED FILL — traZODone HCL 50 MG TABS: 50 | 30 days supply | Qty: 15 | Fill #4

## 2016-06-21 MED FILL — ATORVASTATIN 40 MG TABLET: 40 | 30 days supply | Qty: 30 | Fill #1

## 2016-06-21 MED FILL — FENOFIBRATE 160 MG TABLET: 160 | 30 days supply | Qty: 30 | Fill #4

## 2016-06-21 MED FILL — SERTRALINE HCL 100 MG TAB: 100 | 30 days supply | Qty: 30 | Fill #4

## 2016-08-16 MED FILL — FENOFIBRATE 160 MG TABLET: 160 | 30 days supply | Qty: 30 | Fill #5

## 2016-08-16 MED FILL — traZODone HCL 50 MG TABS: 50 | 30 days supply | Qty: 15 | Fill #5

## 2016-08-16 MED FILL — SERTRALINE HCL 100 MG TAB: 100 | 30 days supply | Qty: 30 | Fill #5

## 2016-08-16 MED FILL — ATORVASTATIN 40 MG TABLET: 40 | 30 days supply | Qty: 30 | Fill #2

## 2016-10-05 MED FILL — traZODone HCL 50 MG TABS: 50 | 30 days supply | Qty: 15 | Fill #6

## 2016-10-07 ENCOUNTER — Other Ambulatory Visit: Payer: Self-pay | Admitting: Physician Assistant

## 2016-10-08 ENCOUNTER — Encounter: Payer: Self-pay | Admitting: Emergency Medicine

## 2016-10-08 MED FILL — FENOFIBRATE 160 MG TABLET: 160 | 30 days supply | Qty: 30 | Fill #0

## 2016-10-08 MED FILL — SERTRALINE HCL 100 MG TAB: 100 | 30 days supply | Qty: 30 | Fill #0

## 2016-10-08 NOTE — Telephone Encounter (Signed)
Last visit was 12/22/15 for CPE, started Trazodone for sleep, suppose to f/u in 1 month Please advise

## 2016-10-08 NOTE — Telephone Encounter (Signed)
Ok to give 30-day supply of each. Needs follow-up scheduled before further refills.

## 2016-10-30 DIAGNOSIS — Z209 Contact with and (suspected) exposure to unspecified communicable disease: Secondary | ICD-10-CM | POA: Diagnosis not present

## 2016-10-30 DIAGNOSIS — Z23 Encounter for immunization: Secondary | ICD-10-CM | POA: Diagnosis not present

## 2016-11-16 ENCOUNTER — Ambulatory Visit: Payer: 59 | Admitting: Physician Assistant

## 2016-11-18 ENCOUNTER — Ambulatory Visit: Payer: 59 | Admitting: Physician Assistant

## 2016-11-19 ENCOUNTER — Ambulatory Visit (INDEPENDENT_AMBULATORY_CARE_PROVIDER_SITE_OTHER): Payer: 59 | Admitting: Physician Assistant

## 2016-11-19 ENCOUNTER — Ambulatory Visit: Payer: 59 | Admitting: Physician Assistant

## 2016-11-19 ENCOUNTER — Encounter: Payer: Self-pay | Admitting: Physician Assistant

## 2016-11-19 VITALS — BP 112/78 | HR 84 | Temp 98.4°F | Resp 16 | Ht 69.0 in | Wt 204.0 lb

## 2016-11-19 DIAGNOSIS — R5381 Other malaise: Secondary | ICD-10-CM | POA: Diagnosis not present

## 2016-11-19 DIAGNOSIS — F419 Anxiety disorder, unspecified: Secondary | ICD-10-CM

## 2016-11-19 DIAGNOSIS — E785 Hyperlipidemia, unspecified: Secondary | ICD-10-CM

## 2016-11-19 DIAGNOSIS — F418 Other specified anxiety disorders: Secondary | ICD-10-CM

## 2016-11-19 DIAGNOSIS — F32A Depression, unspecified: Secondary | ICD-10-CM

## 2016-11-19 DIAGNOSIS — R0602 Shortness of breath: Secondary | ICD-10-CM

## 2016-11-19 DIAGNOSIS — F5101 Primary insomnia: Secondary | ICD-10-CM

## 2016-11-19 DIAGNOSIS — F329 Major depressive disorder, single episode, unspecified: Secondary | ICD-10-CM

## 2016-11-19 LAB — COMPREHENSIVE METABOLIC PANEL
ALT: 32 U/L (ref 0–53)
AST: 23 U/L (ref 0–37)
Albumin: 4.8 g/dL (ref 3.5–5.2)
Alkaline Phosphatase: 44 U/L (ref 39–117)
BILIRUBIN TOTAL: 0.6 mg/dL (ref 0.2–1.2)
BUN: 23 mg/dL (ref 6–23)
CALCIUM: 9.8 mg/dL (ref 8.4–10.5)
CHLORIDE: 103 meq/L (ref 96–112)
CO2: 30 meq/L (ref 19–32)
CREATININE: 1.26 mg/dL (ref 0.40–1.50)
GFR: 68.54 mL/min (ref >60.00)
GLUCOSE: 91 mg/dL (ref 70–99)
Potassium: 4.2 mEq/L (ref 3.5–5.1)
SODIUM: 139 meq/L (ref 135–145)
Total Protein: 7.4 g/dL (ref 6.0–8.3)

## 2016-11-19 LAB — TSH: TSH: 0.69 u[IU]/mL (ref 0.35–4.50)

## 2016-11-19 LAB — LIPID PANEL
CHOLESTEROL: 208 mg/dL — AB (ref 0–200)
HDL: 31.9 mg/dL — ABNORMAL LOW (ref >39.00)
NonHDL: 175.99
TRIGLYCERIDES: 381 mg/dL — AB (ref 0.0–149.0)
Total CHOL/HDL Ratio: 7
VLDL: 76.2 mg/dL — ABNORMAL HIGH (ref 0.0–40.0)

## 2016-11-19 LAB — LDL CHOLESTEROL, DIRECT: Direct LDL: 143 mg/dL

## 2016-11-19 MED ORDER — ALPRAZOLAM 0.5 MG PO TABS: ORAL_TABLET | ORAL | 0 refills | Status: DC

## 2016-11-19 MED ORDER — SERTRALINE HCL 100 MG PO TABS: 100.0000 mg | ORAL_TABLET | Freq: Every day | ORAL | 5 refills | Status: DC

## 2016-11-19 MED ORDER — TRAZODONE HCL 50 MG PO TABS
25.0000 mg | ORAL_TABLET | Freq: Every day | ORAL | 3 refills | Status: DC
Start: 2016-11-19 — End: 2017-08-01

## 2016-11-19 MED FILL — SERTRALINE HCL 100 MG TAB: 100 | 30 days supply | Qty: 30 | Fill #0

## 2016-11-19 MED FILL — traZODone HCL 50 MG TABS: 50 | 60 days supply | Qty: 30 | Fill #0

## 2016-11-19 NOTE — Patient Instructions (Signed)
Please go to the lab for blood work. I will call you with your results.  Stay well hydrated. Eat a well-balanced diet -- watching those portion sizes. Try out some of the suggestions we discussed.  Would recommend you download the MyFitnessPal app on your phone. This will help you determine daily calorie needs.  Would recommend goal of 1 pound weight loss per week.  Please increase aerobic activity to 150 minutes per week.  Follow-up as scheduled for your complete physical.

## 2016-11-19 NOTE — Progress Notes (Signed)
Patient presents to clinic today for follow-up of chronic medical conditions.   Hyperlipidemia -- Is not taking cholesterol medication as directed. States his wife said that he needed to just work on diet and exercise. Has quit smoking this past year, noting 15 pound weight gain afterwards. Is not exercising at present. Body mass index is 30.13 kg/m. Endorses diet well-balanced but very large portion sizes.  Anxiety/Depression -- Currently on Sertraline 100 mg daily and Trazodone 25 mg nightly. Endorses good mood and sleeping well with current regimen. Has Rx Xanax for acute anxiety. Endorses taking about 1-2 tablets max per month. Denies SI/HI.   Past Medical History:  Diagnosis Date  . Anxiety   . Arthritis   . Chest pain around 12-02-2014  . GERD (gastroesophageal reflux disease)   . Hypercholesteremia     Current Outpatient Prescriptions on File Prior to Visit  Medication Sig Dispense Refill  . ALPRAZolam (XANAX) 0.5 MG tablet Take up to twice daily as needed for breakthrough anxiety 60 tablet 0  . atorvastatin (LIPITOR) 40 MG tablet Take 1 tablet (40 mg total) by mouth daily. 30 tablet 5  . esomeprazole (NEXIUM) 20 MG capsule Take 20 mg by mouth daily at 12 noon.    . fenofibrate 160 MG tablet TAKE 1 TABLET BY MOUTH DAILY. 30 tablet 0  . ibuprofen (ADVIL,MOTRIN) 200 MG tablet Take 200 mg by mouth every 6 (six) hours as needed for mild pain or moderate pain.    Marland Kitchen sertraline (ZOLOFT) 100 MG tablet TAKE 1 TABLET BY MOUTH ONCE DAILY 30 tablet 0  . traZODone (DESYREL) 50 MG tablet Take 0.5 tablets (25 mg total) by mouth at bedtime. 30 tablet 3   No current facility-administered medications on file prior to visit.     No Known Allergies  Family History  Problem Relation Age of Onset  . Healthy Mother     Living  . Hypertension Father     Living  . Non-Hodgkin's lymphoma Father   . Diabetes Father   . Hypertension Other     Paternal side  . Heart attack Paternal Grandfather    . Healthy Sister     x1  . Healthy Daughter     x3    Social History   Social History  . Marital status: Married    Spouse name: N/A  . Number of children: N/A  . Years of education: N/A   Social History Main Topics  . Smoking status: Former Smoker    Packs/day: 0.25    Years: 5.00    Quit date: 12/26/2015  . Smokeless tobacco: Never Used  . Alcohol use 3.6 oz/week    6 Cans of beer per week     Comment: occasional  . Drug use: No  . Sexual activity: Yes    Birth control/ protection: None   Other Topics Concern  . None   Social History Narrative  . None    Review of Systems - See HPI.  All other ROS are negative.  BP 112/78   Pulse 84   Temp 98.4 F (36.9 C) (Oral)   Resp 16   Ht 5\' 9"  (1.753 m)   Wt 204 lb (92.5 kg)   SpO2 98%   BMI 30.13 kg/m   Physical Exam  Constitutional: He is oriented to person, place, and time and well-developed, well-nourished, and in no distress.  Eyes: Conjunctivae are normal.  Neck: Neck supple. No thyromegaly present.  Cardiovascular: Normal rate, regular rhythm, normal  heart sounds and intact distal pulses.   Pulmonary/Chest: Effort normal and breath sounds normal. No respiratory distress. He has no wheezes. He has no rales. He exhibits no tenderness.  Lymphadenopathy:    He has no cervical adenopathy.  Neurological: He is alert and oriented to person, place, and time.  Skin: Skin is warm and dry. No rash noted.  Psychiatric: Affect normal.  Vitals reviewed.  Assessment/Plan: No problem-specific Assessment & Plan notes found for this encounter.    Leeanne Rio, PA-C

## 2016-11-19 NOTE — Progress Notes (Signed)
Pre visit review using our clinic review tool, if applicable. No additional management support is needed unless otherwise documented below in the visit note. 

## 2016-11-21 DIAGNOSIS — R5381 Other malaise: Secondary | ICD-10-CM | POA: Insufficient documentation

## 2016-11-21 NOTE — Assessment & Plan Note (Signed)
Doing very well. No alarm signs/symptoms. Continue current regimen. Medications refilled.

## 2016-11-21 NOTE — Assessment & Plan Note (Signed)
Repeat lipids today. Will alter regimen based on results. Discussed appropriate diet and exercise interventions to help with cholesterol and weight.

## 2016-11-21 NOTE — Assessment & Plan Note (Signed)
15 pound weight gain per patient.  Body mass index is 30.13 kg/m. EKG obtained due to mention of feeling SOB sometimes with exertion. No acute findings on EKG. Deconditioning most likely. Will check labs today. Will also work on diet and exercise.

## 2016-11-21 NOTE — Assessment & Plan Note (Signed)
Doing very well with Trazodone. Continue current medication regimen.

## 2016-11-23 ENCOUNTER — Other Ambulatory Visit: Payer: Self-pay | Admitting: Physician Assistant

## 2016-11-23 DIAGNOSIS — E785 Hyperlipidemia, unspecified: Secondary | ICD-10-CM

## 2016-11-23 MED ORDER — ATORVASTATIN CALCIUM 20 MG PO TABS: 20.0000 mg | ORAL_TABLET | Freq: Every day | ORAL | 1 refills | Status: DC

## 2016-11-23 MED ORDER — FENOFIBRATE 160 MG PO TABS: 160.0000 mg | ORAL_TABLET | Freq: Every day | ORAL | 3 refills | Status: DC

## 2016-11-23 MED FILL — ATORVASTATIN 20 MG TABLET: 20 | 30 days supply | Qty: 30 | Fill #0

## 2016-11-23 MED FILL — FENOFIBRATE 160 MG TABLET: 160 | 30 days supply | Qty: 30 | Fill #0

## 2016-11-29 MED FILL — ALPRAZolam 0.5 MG TABS: 0.5 | 30 days supply | Qty: 60 | Fill #0

## 2016-12-22 ENCOUNTER — Encounter: Payer: Self-pay | Admitting: Physician Assistant

## 2016-12-22 ENCOUNTER — Ambulatory Visit (INDEPENDENT_AMBULATORY_CARE_PROVIDER_SITE_OTHER): Payer: 59 | Admitting: Physician Assistant

## 2016-12-22 VITALS — BP 118/80 | HR 63 | Temp 98.4°F | Resp 14 | Ht 69.0 in | Wt 202.0 lb

## 2016-12-22 DIAGNOSIS — F418 Other specified anxiety disorders: Secondary | ICD-10-CM

## 2016-12-22 DIAGNOSIS — E785 Hyperlipidemia, unspecified: Secondary | ICD-10-CM | POA: Diagnosis not present

## 2016-12-22 DIAGNOSIS — Z0000 Encounter for general adult medical examination without abnormal findings: Secondary | ICD-10-CM | POA: Diagnosis not present

## 2016-12-22 DIAGNOSIS — F329 Major depressive disorder, single episode, unspecified: Secondary | ICD-10-CM

## 2016-12-22 DIAGNOSIS — F32A Depression, unspecified: Secondary | ICD-10-CM

## 2016-12-22 DIAGNOSIS — F419 Anxiety disorder, unspecified: Principal | ICD-10-CM

## 2016-12-22 LAB — URINALYSIS, ROUTINE W REFLEX MICROSCOPIC
Bilirubin Urine: NEGATIVE
Hgb urine dipstick: NEGATIVE
Ketones, ur: NEGATIVE
LEUKOCYTES UA: NEGATIVE
Nitrite: NEGATIVE
PH: 5.5 (ref 5.0–8.0)
SPECIFIC GRAVITY, URINE: 1.025 (ref 1.000–1.030)
TOTAL PROTEIN, URINE-UPE24: NEGATIVE
Urine Glucose: NEGATIVE
Urobilinogen, UA: 0.2 (ref 0.0–1.0)

## 2016-12-22 LAB — LIPID PANEL
CHOL/HDL RATIO: 4
Cholesterol: 162 mg/dL (ref 0–200)
HDL: 39.8 mg/dL (ref >39.00)
LDL CALC: 97 mg/dL (ref 0–99)
NONHDL: 121.96
Triglycerides: 125 mg/dL (ref 0.0–149.0)
VLDL: 25 mg/dL (ref 0.0–40.0)

## 2016-12-22 LAB — CBC WITH DIFFERENTIAL/PLATELET
BASOS ABS: 0 10*3/uL (ref 0.0–0.1)
Basophils Relative: 0.5 % (ref 0.0–3.0)
EOS ABS: 0.2 10*3/uL (ref 0.0–0.7)
Eosinophils Relative: 2.8 % (ref 0.0–5.0)
HEMATOCRIT: 44 % (ref 39.0–52.0)
Hemoglobin: 15.1 g/dL (ref 13.0–17.0)
LYMPHS ABS: 1.8 10*3/uL (ref 0.7–4.0)
LYMPHS PCT: 29.6 % (ref 12.0–46.0)
MCHC: 34.4 g/dL (ref 30.0–36.0)
MCV: 86.8 fl (ref 78.0–100.0)
MONO ABS: 0.4 10*3/uL (ref 0.1–1.0)
Monocytes Relative: 7.2 % (ref 3.0–12.0)
NEUTROS ABS: 3.7 10*3/uL (ref 1.4–7.7)
NEUTROS PCT: 59.9 % (ref 43.0–77.0)
Platelets: 185 10*3/uL (ref 150.0–400.0)
RBC: 5.07 Mil/uL (ref 4.22–5.81)
RDW: 13.3 % (ref 11.5–15.5)
WBC: 6.1 10*3/uL (ref 4.0–10.5)

## 2016-12-22 LAB — COMPREHENSIVE METABOLIC PANEL
ALT: 20 U/L (ref 0–53)
AST: 16 U/L (ref 0–37)
Albumin: 4.7 g/dL (ref 3.5–5.2)
Alkaline Phosphatase: 43 U/L (ref 39–117)
BILIRUBIN TOTAL: 0.6 mg/dL (ref 0.2–1.2)
BUN: 16 mg/dL (ref 6–23)
CHLORIDE: 104 meq/L (ref 96–112)
CO2: 30 meq/L (ref 19–32)
CREATININE: 1.15 mg/dL (ref 0.40–1.50)
Calcium: 9.8 mg/dL (ref 8.4–10.5)
GFR: 76.12 mL/min (ref >60.00)
GLUCOSE: 98 mg/dL (ref 70–99)
Potassium: 4.4 mEq/L (ref 3.5–5.1)
SODIUM: 141 meq/L (ref 135–145)
Total Protein: 7.4 g/dL (ref 6.0–8.3)

## 2016-12-22 LAB — HEMOGLOBIN A1C: Hgb A1c MFr Bld: 5.5 % (ref 4.6–6.5)

## 2016-12-22 LAB — TSH: TSH: 0.8 u[IU]/mL (ref 0.35–4.50)

## 2016-12-22 NOTE — Patient Instructions (Signed)
Please go to the lab for blood work.   Our office will call you with your results unless you have chosen to receive results via MyChart.  If your blood work is normal we will follow-up each year for physicals and as scheduled for chronic medical problems.  If anything is abnormal we will treat accordingly and get you in for a follow-up.  Please continue chronic medications as directed. Will alter regimen based on results.   Please try to increase aerobic exercise -- At least 150 minutes of moderate intensity aerobic exercise.   Food Choices to Lower Your Triglycerides Triglycerides are a type of fat in your blood. High levels of triglycerides can increase the risk of heart disease and stroke. If your triglyceride levels are high, the foods you eat and your eating habits are very important. Choosing the right foods can help lower your triglycerides. What general guidelines do I need to follow?  Lose weight if you are overweight.  Limit or avoid alcohol.  Fill one half of your plate with vegetables and green salads.  Limit fruit to two servings a day. Choose fruit instead of juice.  Make one fourth of your plate whole grains. Look for the word "whole" as the first word in the ingredient list.  Fill one fourth of your plate with lean protein foods.  Enjoy fatty fish (such as salmon, mackerel, sardines, and tuna) three times a week.  Choose healthy fats.  Limit foods high in starch and sugar.  Eat more home-cooked food and less restaurant, buffet, and fast food.  Limit fried foods.  Cook foods using methods other than frying.  Limit saturated fats.  Check ingredient lists to avoid foods with partially hydrogenated oils (trans fats) in them. What foods can I eat? Grains  Whole grains, such as whole wheat or whole grain breads, crackers, cereals, and pasta. Unsweetened oatmeal, bulgur, barley, quinoa, or brown rice. Corn or whole wheat flour tortillas. Vegetables  Fresh or  frozen vegetables (raw, steamed, roasted, or grilled). Green salads. Fruits  All fresh, canned (in natural juice), or frozen fruits. Meat and Other Protein Products  Ground beef (85% or leaner), grass-fed beef, or beef trimmed of fat. Skinless chicken or [place]. Ground chicken or [place]. Pork trimmed of fat. All fish and seafood. Eggs. Dried beans, peas, or lentils. Unsalted nuts or seeds. Unsalted canned or dry beans. Dairy  Low-fat dairy products, such as skim or 1% milk, 2% or reduced-fat cheeses, low-fat ricotta or cottage cheese, or plain low-fat yogurt. Fats and Oils  Tub margarines without trans fats. Light or reduced-fat mayonnaise and salad dressings. Avocado. Safflower, olive, or canola oils. Natural peanut or almond butter. The items listed above may not be a complete list of recommended foods or beverages. Contact your dietitian for more options.  What foods are not recommended? Grains  White bread. White pasta. White rice. Cornbread. Bagels, pastries, and croissants. Crackers that contain trans fat. Vegetables  White potatoes. Corn. Creamed or fried vegetables. Vegetables in a cheese sauce. Fruits  Dried fruits. Canned fruit in light or heavy syrup. Fruit juice. Meat and Other Protein Products  Fatty cuts of meat. Ribs, chicken wings, bacon, sausage, bologna, salami, chitterlings, fatback, hot dogs, bratwurst, and packaged luncheon meats. Dairy  Whole or 2% milk, cream, half-and-half, and cream cheese. Whole-fat or sweetened yogurt. Full-fat cheeses. Nondairy creamers and whipped toppings. Processed cheese, cheese spreads, or cheese curds. Sweets and Desserts  Corn syrup, sugars, honey, and molasses. Candy. Jam and jelly.  Syrup. Sweetened cereals. Cookies, pies, cakes, donuts, muffins, and ice cream. Fats and Oils  Butter, stick margarine, lard, shortening, ghee, or bacon fat. Coconut, palm kernel, or palm oils. Beverages  Alcohol. Sweetened drinks (such as sodas, lemonade, and  fruit drinks or punches). The items listed above may not be a complete list of foods and beverages to avoid. Contact your dietitian for more information.  This information is not intended to replace advice given to you by your health care provider. Make sure you discuss any questions you have with your health care provider. Document Released: 07/29/2004 Document Revised: 03/18/2016 Document Reviewed: 08/15/2013 Elsevier Interactive Patient Education  2017 [place] 18-39 Years, Male Preventive care refers to lifestyle choices and visits with your health care provider that can promote health and wellness. What does preventive care include?  A yearly physical exam. This is also called an annual well check.  Dental exams once or twice a year.  Routine eye exams. Ask your health care provider how often you should have your eyes checked.  Personal lifestyle choices, including:  Daily care of your teeth and gums.  Regular physical activity.  Eating a healthy diet.  Avoiding tobacco and drug use.  Limiting alcohol use.  Practicing safe sex. What happens during an annual well check? The services and screenings done by your health care provider during your annual well check will depend on your age, overall health, lifestyle risk factors, and family history of disease. Counseling  Your health care provider may ask you questions about your:  Alcohol use.  Tobacco use.  Drug use.  Emotional well-being.  Home and relationship well-being.  Sexual activity.  Eating habits.  Work and work Statistician. Screening  You may have the following tests or measurements:  Height, weight, and BMI.  Blood pressure.  Lipid and cholesterol levels. These may be checked every 5 years starting at age 61.  Diabetes screening. This is done by checking your blood sugar (glucose) after you have not eaten for a while (fasting).  Skin check.  Hepatitis C blood  test.  Hepatitis B blood test.  Sexually transmitted disease (STD) testing. Discuss your test results, treatment options, and if necessary, the need for more tests with your health care provider. Vaccines  Your health care provider may recommend certain vaccines, such as:  Influenza vaccine. This is recommended every year.  Tetanus, diphtheria, and acellular pertussis (Tdap, Td) vaccine. You may need a Td booster every 10 years.  Varicella vaccine. You may need this if you have not been vaccinated.  HPV vaccine. If you are 76 or younger, you may need three doses over 6 months.  Measles, mumps, and rubella (MMR) vaccine. You may need at least one dose of MMR.You may also need a second dose.  Pneumococcal 13-valent conjugate (PCV13) vaccine. You may need this if you have certain conditions and have not been vaccinated.  Pneumococcal polysaccharide (PPSV23) vaccine. You may need one or two doses if you smoke cigarettes or if you have certain conditions.  Meningococcal vaccine. One dose is recommended if you are age 61-21 years and a first-year college student living in a residence hall, or if you have one of several medical conditions. You may also need additional booster doses.  Hepatitis A vaccine. You may need this if you have certain conditions or if you travel or work in places where you may be exposed to hepatitis A.  Hepatitis B vaccine. You may need this if you  have certain conditions or if you travel or work in places where you may be exposed to hepatitis B.  Haemophilus influenzae type b (Hib) vaccine. You may need this if you have certain risk factors. Talk to your health care provider about which screenings and vaccines you need and how often you need them. This information is not intended to replace advice given to you by your health care provider. Make sure you discuss any questions you have with your health care provider. Document Released: 12/07/2001 Document Revised:  06/30/2016 Document Reviewed: 08/12/2015 Elsevier Interactive Patient Education  2017 [employer].

## 2016-12-22 NOTE — Progress Notes (Signed)
Patient presents to clinic today for annual exam.  Patient is fasting for labs. Has recently joined a gym. Is working on scheduling more time at the gym. Is staying more active with his children. Is watching diet -- has cut out excess carbohydrates and cutting back on portion size.  Is drinking water and green tea.  Acute Concerns: Denies acute concerns at today's visit.   Chronic Issues: Hyperlipidemia -- Is currently on a regimen of Lipitor and fenofibrate. Is taking as directed without side effects. Is working on dietary changes and exercise.  Anxiety and Depression -- Currently on Sertraline 100 mg daily. Also Trazodone PRN at night for insomnia. Is taking as directed. Doing very well. Denies breakthrough symptoms, SI/HI. Alprazolam very rare use.  Health Maintenance: Immunizations -- Flu shot and Tetanus up-to-date.  HIV -- low risk. Declines risk.   Past Medical History:  Diagnosis Date  . Anxiety   . Arthritis   . Chest pain around 12-02-2014  . GERD (gastroesophageal reflux disease)   . Hypercholesteremia     Past Surgical History:  Procedure Laterality Date  . ELBOW FRACTURE SURGERY Right 07.16.14   Radial Repair  . INGUINAL HERNIA REPAIR Right 01/06/2015   Procedure: LAPAROSCOPIC REPAIR OF RIGHT INGUINAL HERNIA WITH MESH;  Surgeon: Greer Pickerel, MD;  Location: [facility];  Service: General;  Laterality: Right;  . INSERTION OF MESH Right 01/06/2015   Procedure: INSERTION OF MESH;  Surgeon: Greer Pickerel, MD;  Location: [facility];  Service: General;  Laterality: Right;  . LEG SURGERY Right    lower, for fracture age 48  . VASECTOMY    . VASECTOMY REVERSAL      Current Outpatient Prescriptions on File Prior to Visit  Medication Sig Dispense Refill  . ALPRAZolam (XANAX) 0.5 MG tablet Take up to twice daily as needed for breakthrough anxiety 60 tablet 0  . atorvastatin (LIPITOR) 20 MG tablet Take 1 tablet (20 mg total) by mouth daily. 30 tablet 1  . esomeprazole (NEXIUM) 20 MG  capsule Take 20 mg by mouth daily at 12 noon.    . fenofibrate 160 MG tablet Take 1 tablet (160 mg total) by mouth daily. 30 tablet 3  . ibuprofen (ADVIL,MOTRIN) 200 MG tablet Take 200 mg by mouth every 6 (six) hours as needed for mild pain or moderate pain.    Marland Kitchen sertraline (ZOLOFT) 100 MG tablet Take 1 tablet (100 mg total) by mouth daily. 30 tablet 5  . traZODone (DESYREL) 50 MG tablet Take 0.5 tablets (25 mg total) by mouth at bedtime. 30 tablet 3   No current facility-administered medications on file prior to visit.     No Known Allergies  Family History  Problem Relation Age of Onset  . Healthy Mother     Living  . Hypertension Father     Living  . Non-Hodgkin's lymphoma Father   . Diabetes Father   . Hypertension Other     Paternal side  . Heart attack Paternal Grandfather   . Healthy Sister     x1  . Healthy Daughter     x3    Social History   Social History  . Marital status: Married    Spouse name: N/A  . Number of children: N/A  . Years of education: N/A   Occupational History  . Not on file.   Social History Main Topics  . Smoking status: Former Smoker    Packs/day: 0.25    Years: 5.00  Quit date: 12/26/2015  . Smokeless tobacco: Never Used  . Alcohol use 3.6 oz/week    6 Cans of beer per week     Comment: occasional  . Drug use: No  . Sexual activity: Yes    Birth control/ protection: None   Other Topics Concern  . Not on file   Social History Narrative  . No narrative on file   Review of Systems  Constitutional: Negative for fever and weight loss.  HENT: Negative for ear discharge, ear pain, hearing loss and tinnitus.   Eyes: Negative for blurred vision, double vision, photophobia and pain.  Respiratory: Negative for cough and shortness of breath.   Cardiovascular: Negative for chest pain and palpitations.  Gastrointestinal: Negative for abdominal pain, blood in stool, constipation, diarrhea, heartburn, melena, nausea and vomiting.    Genitourinary: Negative for dysuria, flank pain, frequency, hematuria and urgency.  Musculoskeletal: Negative for falls.  Neurological: Negative for dizziness, loss of consciousness and headaches.  Endo/Heme/Allergies: Negative for environmental allergies.  Psychiatric/Behavioral: Negative for depression, hallucinations, substance abuse and suicidal ideas. The patient is not nervous/anxious and does not have insomnia.    BP 118/80   Pulse 63   Temp 98.4 F (36.9 C) (Oral)   Resp 14   Ht '5\' 9"'  (1.753 m)   Wt 202 lb (91.6 kg)   SpO2 98%   BMI 29.83 kg/m   Physical Exam  Constitutional: He is oriented to person, place, and time and well-developed, well-nourished, and in no distress.  HENT:  Head: Normocephalic and atraumatic.  Right Ear: External ear normal.  Left Ear: External ear normal.  Nose: Nose normal.  Mouth/Throat: Oropharynx is clear and moist. No oropharyngeal exudate.  Eyes: Conjunctivae and EOM are normal. Pupils are equal, round, and reactive to light.  Neck: Neck supple. No thyromegaly present.  Cardiovascular: Normal rate, regular rhythm, normal heart sounds and intact distal pulses.   Pulmonary/Chest: Effort normal and breath sounds normal. No respiratory distress. He has no wheezes. He has no rales. He exhibits no tenderness.  Abdominal: Soft. Bowel sounds are normal. He exhibits no distension and no mass. There is no tenderness. There is no rebound and no guarding.  Genitourinary: Testes/scrotum normal.  Lymphadenopathy:    He has no cervical adenopathy.  Neurological: He is alert and oriented to person, place, and time.  Skin: Skin is warm and dry. No rash noted.  Psychiatric: Affect normal.  Vitals reviewed.  Recent Results (from the past 2160 hour(s))  Comp Met (CMET)     Status: None   Collection Time: 11/19/16  2:09 PM  Result Value Ref Range   Sodium 139 135 - 145 mEq/L   Potassium 4.2 3.5 - 5.1 mEq/L   Chloride 103 96 - 112 mEq/L   CO2 30 19 - 32  mEq/L   Glucose, Bld 91 70 - 99 mg/dL   BUN 23 6 - 23 mg/dL   Creatinine, Ser 1.26 0.40 - 1.50 mg/dL   Total Bilirubin 0.6 0.2 - 1.2 mg/dL   Alkaline Phosphatase 44 39 - 117 U/L   AST 23 0 - 37 U/L   ALT 32 0 - 53 U/L   Total Protein 7.4 6.0 - 8.3 g/dL   Albumin 4.8 3.5 - 5.2 g/dL   Calcium 9.8 8.4 - 10.5 mg/dL   GFR 68.54 >60.00 mL/min  TSH     Status: None   Collection Time: 11/19/16  2:09 PM  Result Value Ref Range   TSH 0.69 0.35 -  4.50 uIU/mL  Lipid panel     Status: Abnormal   Collection Time: 11/19/16  2:09 PM  Result Value Ref Range   Cholesterol 208 (H) 0 - 200 mg/dL    Comment: ATP III Classification       Desirable:  < 200 mg/dL               Borderline High:  200 - 239 mg/dL          High:  > = 240 mg/dL   Triglycerides 381.0 (H) 0.0 - 149.0 mg/dL    Comment: Normal:  <150 mg/dLBorderline High:  150 - 199 mg/dL   HDL 31.90 (L) >39.00 mg/dL   VLDL 76.2 (H) 0.0 - 40.0 mg/dL   Total CHOL/HDL Ratio 7     Comment:                Men          Women1/2 Average Risk     3.4          3.3Average Risk          5.0          4.42X Average Risk          9.6          7.13X Average Risk          15.0          11.0                       NonHDL 175.99     Comment: NOTE:  Non-HDL goal should be 30 mg/dL higher than patient's LDL goal (i.e. LDL goal of < 70 mg/dL, would have non-HDL goal of < 100 mg/dL)  LDL cholesterol, direct     Status: None   Collection Time: 11/19/16  2:09 PM  Result Value Ref Range   Direct LDL 143.0 mg/dL    Comment: Optimal:  <100 mg/dLNear or Above Optimal:  100-129 mg/dLBorderline High:  130-159 mg/dLHigh:  160-189 mg/dLVery High:  >190 mg/dL   Assessment/Plan: Anxiety and depression Doing very well on current regimen. Will continue current regimen.   Encounter for preventive health examination Depression screen negative. Health Maintenance reviewed -- Immunizations are up-to-date. Declines HIV screening. Preventive schedule discussed and handout given in  AVS. Will obtain fasting labs today.   Hyperlipidemia Will work on diet, exercise and alcohol consumption.  Labs today for reassessment of lipids and LFT on new regimen.      Leeanne Rio, PA-C

## 2016-12-22 NOTE — Assessment & Plan Note (Signed)
Will work on diet, exercise and alcohol consumption.  Labs today for reassessment of lipids and LFT on new regimen.

## 2016-12-22 NOTE — Assessment & Plan Note (Signed)
Doing very well on current regimen. Will continue current regimen.

## 2016-12-22 NOTE — Assessment & Plan Note (Signed)
Depression screen negative. Health Maintenance reviewed -- Immunizations are up-to-date. Declines HIV screening. Preventive schedule discussed and handout given in AVS. Will obtain fasting labs today.

## 2016-12-22 NOTE — Progress Notes (Signed)
Pre visit review using our clinic review tool, if applicable. No additional management support is needed unless otherwise documented below in the visit note. 

## 2016-12-31 MED FILL — SERTRALINE HCL 100 MG TAB: 100 | 30 days supply | Qty: 30 | Fill #1

## 2017-01-10 MED FILL — ATORVASTATIN 20 MG TABLET: 20 | 30 days supply | Qty: 30 | Fill #1

## 2017-01-10 MED FILL — FENOFIBRATE 160 MG TABLET: 160 | 30 days supply | Qty: 30 | Fill #1

## 2017-01-13 MED FILL — traZODone HCL 50 MG TABS: 50 | 60 days supply | Qty: 30 | Fill #1

## 2017-01-31 MED FILL — SERTRALINE HCL 100 MG TAB: 100 | 30 days supply | Qty: 30 | Fill #2

## 2017-02-06 DIAGNOSIS — J0140 Acute pansinusitis, unspecified: Secondary | ICD-10-CM | POA: Diagnosis not present

## 2017-02-23 ENCOUNTER — Other Ambulatory Visit: Payer: Self-pay | Admitting: Physician Assistant

## 2017-02-23 DIAGNOSIS — E785 Hyperlipidemia, unspecified: Secondary | ICD-10-CM

## 2017-02-23 MED FILL — FENOFIBRATE 160 MG TABLET: 160 | 30 days supply | Qty: 30 | Fill #2

## 2017-02-24 MED FILL — ATORVASTATIN 20 MG TABLET: 20 | 30 days supply | Qty: 30 | Fill #0

## 2017-03-07 MED FILL — SERTRALINE HCL 100 MG TAB: 100 | 30 days supply | Qty: 30 | Fill #3

## 2017-03-29 ENCOUNTER — Other Ambulatory Visit: Payer: Self-pay | Admitting: Physician Assistant

## 2017-03-29 DIAGNOSIS — E785 Hyperlipidemia, unspecified: Secondary | ICD-10-CM

## 2017-03-29 MED FILL — traZODone HCL 50 MG TABS: 50 | 60 days supply | Qty: 30 | Fill #2

## 2017-03-29 MED FILL — FENOFIBRATE 160 MG TABLET: 160 | 30 days supply | Qty: 30 | Fill #3

## 2017-03-30 ENCOUNTER — Encounter: Payer: Self-pay | Admitting: Emergency Medicine

## 2017-03-30 MED FILL — ATORVASTATIN 20 MG TABLET: 20 | 30 days supply | Qty: 30 | Fill #0

## 2017-03-31 MED FILL — SERTRALINE HCL 100 MG TAB: 100 | 30 days supply | Qty: 30 | Fill #4

## 2017-05-02 NOTE — Progress Notes (Signed)
Patient presents to clinic today for follow-up of hyperlipidemia. Patient is currently on a regimen of atorvastatin 20 mg and fenofibrate 160 mg once daily.  Is taking medications as directed. Diet -- Endorses well-balanced overall but needs to watch his portion sizes. Exercise -- Has recently increased exercise over the past few weeks. Patient denies chest pain, palpitations, lightheadedness, dizziness, vision changes or frequent headaches.   Past Medical History:  Diagnosis Date  . Anxiety   . Arthritis   . Chest pain around 12-02-2014  . GERD (gastroesophageal reflux disease)   . Hypercholesteremia     Current Outpatient Prescriptions on File Prior to Visit  Medication Sig Dispense Refill  . ALPRAZolam (XANAX) 0.5 MG tablet Take up to twice daily as needed for breakthrough anxiety 60 tablet 0  . atorvastatin (LIPITOR) 20 MG tablet TAKE 1 TABLET BY MOUTH DAILY. 30 tablet 0  . esomeprazole (NEXIUM) 20 MG capsule Take 20 mg by mouth daily at 12 noon.    . fenofibrate 160 MG tablet Take 1 tablet (160 mg total) by mouth daily. 30 tablet 3  . ibuprofen (ADVIL,MOTRIN) 200 MG tablet Take 200 mg by mouth every 6 (six) hours as needed for mild pain or moderate pain.    Marland Kitchen sertraline (ZOLOFT) 100 MG tablet Take 1 tablet (100 mg total) by mouth daily. 30 tablet 5  . traZODone (DESYREL) 50 MG tablet Take 0.5 tablets (25 mg total) by mouth at bedtime. 30 tablet 3   No current facility-administered medications on file prior to visit.     No Known Allergies  Family History  Problem Relation Age of Onset  . Healthy Mother        Living  . Hypertension Father        Living  . Non-Hodgkin's lymphoma Father   . Diabetes Father   . Hypertension Other        Paternal side  . Heart attack Paternal Grandfather   . Healthy Sister        x1  . Healthy Daughter        x3    Social History   Social History  . Marital status: Married    Spouse name: N/A  . Number of children: N/A  . Years of  education: N/A   Social History Main Topics  . Smoking status: Former Smoker    Packs/day: 0.25    Years: 5.00    Quit date: 12/26/2015  . Smokeless tobacco: Never Used  . Alcohol use 3.6 oz/week    6 Cans of beer per week     Comment: occasional  . Drug use: No  . Sexual activity: Yes    Birth control/ protection: None   Other Topics Concern  . None   Social History Narrative  . None   Review of Systems - See HPI.  All other ROS are negative.  BP 118/84   Pulse 61   Temp 98.3 F (36.8 C) (Oral)   Resp 14   Ht 5\' 9"  (1.753 m)   Wt 203 lb (92.1 kg)   SpO2 98%   BMI 29.98 kg/m   Physical Exam  Constitutional: He is oriented to person, place, and time and well-developed, well-nourished, and in no distress.  HENT:  Head: Normocephalic and atraumatic.  Eyes: Conjunctivae are normal.  Neck: Neck supple.  Cardiovascular: Normal rate, regular rhythm, normal heart sounds and intact distal pulses.   Pulmonary/Chest: Effort normal and breath sounds normal. No respiratory distress. He has  no wheezes. He has no rales. He exhibits no tenderness.  Neurological: He is alert and oriented to person, place, and time.  Skin: Skin is warm and dry. No rash noted.  Psychiatric: Affect normal.  Vitals reviewed.  Assessment/Plan: Hyperlipidemia Taking medications as directed. Has recently increased aerobic exercise. Diet needs improvement. Recommendations reviewed with patient. Will repeat labs today. Continue current regimen. Will alter if indicated based on lab results.    Leeanne Rio, PA-C

## 2017-05-03 ENCOUNTER — Ambulatory Visit (INDEPENDENT_AMBULATORY_CARE_PROVIDER_SITE_OTHER): Payer: 59 | Admitting: Physician Assistant

## 2017-05-03 ENCOUNTER — Encounter: Payer: Self-pay | Admitting: Physician Assistant

## 2017-05-03 VITALS — BP 118/84 | HR 61 | Temp 98.3°F | Resp 14 | Ht 69.0 in | Wt 203.0 lb

## 2017-05-03 DIAGNOSIS — E785 Hyperlipidemia, unspecified: Secondary | ICD-10-CM | POA: Diagnosis not present

## 2017-05-03 LAB — COMPREHENSIVE METABOLIC PANEL
ALK PHOS: 38 U/L — AB (ref 39–117)
ALT: 17 U/L (ref 0–53)
AST: 12 U/L (ref 0–37)
Albumin: 4.6 g/dL (ref 3.5–5.2)
BUN: 23 mg/dL (ref 6–23)
CHLORIDE: 105 meq/L (ref 96–112)
CO2: 27 meq/L (ref 19–32)
Calcium: 9.5 mg/dL (ref 8.4–10.5)
Creatinine, Ser: 1.14 mg/dL (ref 0.40–1.50)
GFR: 76.74 mL/min (ref >60.00)
GLUCOSE: 113 mg/dL — AB (ref 70–99)
POTASSIUM: 4.3 meq/L (ref 3.5–5.1)
SODIUM: 139 meq/L (ref 135–145)
TOTAL PROTEIN: 7 g/dL (ref 6.0–8.3)
Total Bilirubin: 0.6 mg/dL (ref 0.2–1.2)

## 2017-05-03 LAB — LIPID PANEL
CHOL/HDL RATIO: 5
Cholesterol: 168 mg/dL (ref 0–200)
HDL: 36.8 mg/dL — ABNORMAL LOW (ref >39.00)
LDL Cholesterol: 100 mg/dL — ABNORMAL HIGH (ref 0–99)
NONHDL: 131.5
Triglycerides: 160 mg/dL — ABNORMAL HIGH (ref 0.0–149.0)
VLDL: 32 mg/dL (ref 0.0–40.0)

## 2017-05-03 MED ORDER — FENOFIBRATE 160 MG PO TABS: 160.0000 mg | ORAL_TABLET | Freq: Every day | ORAL | 1 refills | Status: DC

## 2017-05-03 MED ORDER — ATORVASTATIN CALCIUM 20 MG PO TABS
20.0000 mg | ORAL_TABLET | Freq: Every day | ORAL | 1 refills | Status: DC
Start: 2017-05-03 — End: 2017-11-14

## 2017-05-03 MED FILL — ATORVASTATIN 20 MG TABLET: 20 | 90 days supply | Qty: 90 | Fill #0

## 2017-05-03 MED FILL — FENOFIBRATE 160 MG TABLET: 160 | 90 days supply | Qty: 90 | Fill #0

## 2017-05-03 NOTE — Progress Notes (Signed)
Pre visit review using our clinic review tool, if applicable. No additional management support is needed unless otherwise documented below in the visit note. 

## 2017-05-03 NOTE — Patient Instructions (Addendum)
Please go to the lab for blood work. I will call you with your results.  I have provided you with refills.  Please continue medications as directed.  Work on getting 150 minutes of aerobic exercise per week. Work on those portion sizes!!

## 2017-05-03 NOTE — Assessment & Plan Note (Signed)
Taking medications as directed. Has recently increased aerobic exercise. Diet needs improvement. Recommendations reviewed with patient. Will repeat labs today. Continue current regimen. Will alter if indicated based on lab results.

## 2017-05-03 NOTE — Addendum Note (Signed)
Addended by: Brunetta Jeans on: 05/03/2017 08:17 AM   Modules accepted: Orders

## 2017-05-09 DIAGNOSIS — L814 Other melanin hyperpigmentation: Secondary | ICD-10-CM | POA: Diagnosis not present

## 2017-05-09 DIAGNOSIS — D485 Neoplasm of uncertain behavior of skin: Secondary | ICD-10-CM | POA: Diagnosis not present

## 2017-05-09 DIAGNOSIS — D225 Melanocytic nevi of trunk: Secondary | ICD-10-CM | POA: Diagnosis not present

## 2017-05-09 DIAGNOSIS — L905 Scar conditions and fibrosis of skin: Secondary | ICD-10-CM | POA: Diagnosis not present

## 2017-05-20 MED FILL — traZODone HCL 50 MG TABS: 50 | 60 days supply | Qty: 30 | Fill #3

## 2017-05-20 MED FILL — SERTRALINE HCL 100 MG TAB: 100 | 30 days supply | Qty: 30 | Fill #5

## 2017-06-30 ENCOUNTER — Other Ambulatory Visit: Payer: Self-pay | Admitting: Physician Assistant

## 2017-06-30 MED FILL — SERTRALINE HCL 100 MG TAB: 100 | 30 days supply | Qty: 30 | Fill #0

## 2017-08-01 ENCOUNTER — Other Ambulatory Visit: Payer: Self-pay | Admitting: Physician Assistant

## 2017-08-01 DIAGNOSIS — F5101 Primary insomnia: Secondary | ICD-10-CM

## 2017-08-01 MED FILL — SERTRALINE HCL 100 MG TAB: 100 | 30 days supply | Qty: 30 | Fill #1

## 2017-08-01 MED FILL — traZODone HCL 50 MG TABS: 50 | 60 days supply | Qty: 30 | Fill #0

## 2017-08-01 MED FILL — ATORVASTATIN 20 MG TABLET: 20 | 90 days supply | Qty: 90 | Fill #1

## 2017-08-01 MED FILL — FENOFIBRATE 160 MG TABLET: 160 | 90 days supply | Qty: 90 | Fill #1

## 2017-09-13 MED FILL — SERTRALINE HCL 100 MG TAB: 100 | 30 days supply | Qty: 30 | Fill #2

## 2017-10-10 MED FILL — traZODone HCL 50 MG TABS: 50 | 60 days supply | Qty: 30 | Fill #1

## 2017-10-21 MED FILL — SERTRALINE HCL 100 MG TAB: 100 | 30 days supply | Qty: 30 | Fill #3

## 2017-11-14 ENCOUNTER — Other Ambulatory Visit: Payer: Self-pay | Admitting: Physician Assistant

## 2017-11-14 DIAGNOSIS — E785 Hyperlipidemia, unspecified: Secondary | ICD-10-CM

## 2017-11-15 ENCOUNTER — Encounter: Payer: Self-pay | Admitting: Emergency Medicine

## 2017-11-15 MED FILL — ATORVASTATIN 20 MG TABLET: 20 | 30 days supply | Qty: 30 | Fill #0

## 2017-11-15 MED FILL — FENOFIBRATE 160 MG TABLET: 160 | 30 days supply | Qty: 30 | Fill #0

## 2017-12-05 MED FILL — traZODone HCL 50 MG TABS: 50 | 60 days supply | Qty: 30 | Fill #2

## 2017-12-05 MED FILL — SERTRALINE HCL 100 MG TAB: 100 | 30 days supply | Qty: 30 | Fill #4

## 2017-12-23 ENCOUNTER — Other Ambulatory Visit: Payer: Self-pay | Admitting: Physician Assistant

## 2017-12-23 DIAGNOSIS — E785 Hyperlipidemia, unspecified: Secondary | ICD-10-CM

## 2017-12-26 ENCOUNTER — Telehealth: Payer: Self-pay | Admitting: Physician Assistant

## 2017-12-26 MED FILL — ATORVASTATIN 20 MG TABLET: 20 | 30 days supply | Qty: 30 | Fill #0

## 2017-12-26 MED FILL — FENOFIBRATE 160 MG TABLET: 160 | 30 days supply | Qty: 30 | Fill #0

## 2017-12-26 NOTE — Telephone Encounter (Signed)
Copied from [place] [id number]. Topic: Quick Communication - Rx Refill/Question >> Dec 26, 2017 11:09 AM Scherrie Gerlach wrote: Medication: atorvastatin (LIPITOR) 20 MG tablet                    fenofibrate 160 MG tablet Pt has scheduled his cpe for 01/03/18, but is out of his meds above. Pt hoping to get a 30 day to get him through to this appt.  His boss is out of town this week and he cannot take off or he would scheduled this week..  [id number] - [place], [place] - [place] [phone] (Phone) [phone] (Fax)

## 2018-01-03 ENCOUNTER — Other Ambulatory Visit: Payer: Self-pay

## 2018-01-03 ENCOUNTER — Ambulatory Visit (INDEPENDENT_AMBULATORY_CARE_PROVIDER_SITE_OTHER): Payer: 59 | Admitting: Physician Assistant

## 2018-01-03 ENCOUNTER — Encounter: Payer: Self-pay | Admitting: Physician Assistant

## 2018-01-03 VITALS — BP 100/70 | HR 53 | Temp 97.8°F | Resp 16 | Ht 69.0 in | Wt 208.0 lb

## 2018-01-03 DIAGNOSIS — F32A Depression, unspecified: Secondary | ICD-10-CM

## 2018-01-03 DIAGNOSIS — F329 Major depressive disorder, single episode, unspecified: Secondary | ICD-10-CM | POA: Diagnosis not present

## 2018-01-03 DIAGNOSIS — Z0000 Encounter for general adult medical examination without abnormal findings: Secondary | ICD-10-CM | POA: Diagnosis not present

## 2018-01-03 DIAGNOSIS — E785 Hyperlipidemia, unspecified: Secondary | ICD-10-CM | POA: Diagnosis not present

## 2018-01-03 DIAGNOSIS — F419 Anxiety disorder, unspecified: Secondary | ICD-10-CM

## 2018-01-03 DIAGNOSIS — K219 Gastro-esophageal reflux disease without esophagitis: Secondary | ICD-10-CM

## 2018-01-03 LAB — CBC WITH DIFFERENTIAL/PLATELET
BASOS PCT: 0.5 % (ref 0.0–3.0)
Basophils Absolute: 0 10*3/uL (ref 0.0–0.1)
EOS PCT: 4.3 % (ref 0.0–5.0)
Eosinophils Absolute: 0.2 10*3/uL (ref 0.0–0.7)
HCT: 41.1 % (ref 39.0–52.0)
Hemoglobin: 14.4 g/dL (ref 13.0–17.0)
LYMPHS ABS: 1.9 10*3/uL (ref 0.7–4.0)
Lymphocytes Relative: 32.6 % (ref 12.0–46.0)
MCHC: 35 g/dL (ref 30.0–36.0)
MCV: 85.8 fl (ref 78.0–100.0)
MONOS PCT: 8.7 % (ref 3.0–12.0)
Monocytes Absolute: 0.5 10*3/uL (ref 0.1–1.0)
NEUTROS ABS: 3.1 10*3/uL (ref 1.4–7.7)
NEUTROS PCT: 53.9 % (ref 43.0–77.0)
PLATELETS: 168 10*3/uL (ref 150.0–400.0)
RBC: 4.79 Mil/uL (ref 4.22–5.81)
RDW: 13 % (ref 11.5–15.5)
WBC: 5.7 10*3/uL (ref 4.0–10.5)

## 2018-01-03 LAB — COMPREHENSIVE METABOLIC PANEL
ALT: 35 U/L (ref 0–53)
AST: 16 U/L (ref 0–37)
Albumin: 4.3 g/dL (ref 3.5–5.2)
Alkaline Phosphatase: 43 U/L (ref 39–117)
BUN: 20 mg/dL (ref 6–23)
CALCIUM: 9.4 mg/dL (ref 8.4–10.5)
CO2: 32 mEq/L (ref 19–32)
Chloride: 103 mEq/L (ref 96–112)
Creatinine, Ser: 1.12 mg/dL (ref 0.40–1.50)
GFR: 78.04 mL/min (ref >60.00)
GLUCOSE: 108 mg/dL — AB (ref 70–99)
POTASSIUM: 4.1 meq/L (ref 3.5–5.1)
Sodium: 140 mEq/L (ref 135–145)
Total Bilirubin: 0.6 mg/dL (ref 0.2–1.2)
Total Protein: 6.7 g/dL (ref 6.0–8.3)

## 2018-01-03 LAB — LIPID PANEL
CHOL/HDL RATIO: 5
Cholesterol: 180 mg/dL (ref 0–200)
HDL: 36.9 mg/dL — AB (ref >39.00)
LDL Cholesterol: 112 mg/dL — ABNORMAL HIGH (ref 0–99)
NONHDL: 142.78
Triglycerides: 155 mg/dL — ABNORMAL HIGH (ref 0.0–149.0)
VLDL: 31 mg/dL (ref 0.0–40.0)

## 2018-01-03 LAB — HEMOGLOBIN A1C: Hgb A1c MFr Bld: 5.6 % (ref 4.6–6.5)

## 2018-01-03 NOTE — Progress Notes (Signed)
Patient presents to clinic today for annual exam.  Patient is fasting for labs.  Diet -- Is trying to watch portion sizes.   Exercise -- No current regimen. Is trying to get in a workout at least once a week. Would like to start walking daily now that the time has changed.   Body mass index is 30.72 kg/m.  Acute Concerns: Denies new concerns today.   Chronic Issues: Hyperlipidemia -- Patient is currently on a regimen of Lipitor 20 mg daily and Fenofibrate 160 mg daily. Is taking as directed and tolerating well. Diet and exercise as noted above.   Anxiety/Depression -- Currently on a regimen of Sertraline 100 mg daily. Has been on this regimen for > 5 years. Is starting to feel that it is not effective. Wife has noted him being more irritable and withdrawn than previously. Denies SI/HI.  GERD -- Is taking Nexium as directed. Cannot go more than a couple of days without medication without significant symptoms. Denies epigastric pain, nausea/vomiting.   Health Maintenance: Immunizations -- Declines flu shot. Tetanus up-to-date.  Past Medical History:  Diagnosis Date  . Anxiety   . Arthritis   . Chest pain around 12-02-2014  . GERD (gastroesophageal reflux disease)   . Hypercholesteremia     Past Surgical History:  Procedure Laterality Date  . ELBOW FRACTURE SURGERY Right 07.16.14   Radial Repair  . INGUINAL HERNIA REPAIR Right 01/06/2015   Procedure: LAPAROSCOPIC REPAIR OF RIGHT INGUINAL HERNIA WITH MESH;  Surgeon: Greer Pickerel, MD;  Location: [facility];  Service: General;  Laterality: Right;  . INSERTION OF MESH Right 01/06/2015   Procedure: INSERTION OF MESH;  Surgeon: Greer Pickerel, MD;  Location: [facility];  Service: General;  Laterality: Right;  . LEG SURGERY Right    lower, for fracture age 63  . VASECTOMY    . VASECTOMY REVERSAL      Current Outpatient Medications on File Prior to Visit  Medication Sig Dispense Refill  . ALPRAZolam (XANAX) 0.5 MG tablet Take up to twice  daily as needed for breakthrough anxiety 60 tablet 0  . atorvastatin (LIPITOR) 20 MG tablet TAKE 1 TABLET BY MOUTH DAILY. 30 tablet 0  . esomeprazole (NEXIUM) 20 MG capsule Take 20 mg by mouth daily at 12 noon.    . fenofibrate 160 MG tablet TAKE 1 TABLET BY MOUTH DAILY. 30 tablet 0  . ibuprofen (ADVIL,MOTRIN) 200 MG tablet Take 200 mg by mouth every 6 (six) hours as needed for mild pain or moderate pain.    Marland Kitchen sertraline (ZOLOFT) 100 MG tablet TAKE 1 TABLET BY MOUTH DAILY. 30 tablet 5  . traZODone (DESYREL) 50 MG tablet TAKE 1/2 TABLET BY MOUTH AT BEDTIME. 30 tablet 3   No current facility-administered medications on file prior to visit.     No Known Allergies  Family History  Problem Relation Age of Onset  . Healthy Mother        Living  . Hypertension Father        Living  . Non-Hodgkin's lymphoma Father   . Diabetes Father   . Hypertension Other        Paternal side  . Heart attack Paternal Grandfather   . Healthy Sister        x1  . Healthy Daughter        x3    Social History   Socioeconomic History  . Marital status: Married    Spouse name: Not on file  . Number  of children: Not on file  . Years of education: Not on file  . Highest education level: Not on file  Social Needs  . Financial resource strain: Not on file  . Food insecurity - worry: Not on file  . Food insecurity - inability: Not on file  . Transportation needs - medical: Not on file  . Transportation needs - non-medical: Not on file  Occupational History  . Not on file  Tobacco Use  . Smoking status: Former Smoker    Packs/day: 0.25    Years: 5.00    Pack years: 1.25    Last attempt to quit: 12/26/2015    Years since quitting: 2.0  . Smokeless tobacco: Never Used  Substance and Sexual Activity  . Alcohol use: Yes    Alcohol/week: 3.6 oz    Types: 6 Cans of beer per week    Comment: occasional  . Drug use: No  . Sexual activity: Yes    Birth control/protection: None  Other Topics Concern  .  Not on file  Social History Narrative  . Not on file   Review of Systems  Constitutional: Negative for fever and weight loss.  HENT: Negative for ear discharge, ear pain, hearing loss and tinnitus.   Eyes: Negative for blurred vision, double vision, photophobia and pain.  Respiratory: Negative for cough and shortness of breath.   Cardiovascular: Negative for chest pain and palpitations.  Gastrointestinal: Negative for abdominal pain, blood in stool, constipation, diarrhea, heartburn, melena, nausea and vomiting.  Genitourinary: Negative for dysuria, flank pain, frequency, hematuria and urgency.  Musculoskeletal: Negative for falls.  Neurological: Negative for dizziness, loss of consciousness and headaches.  Endo/Heme/Allergies: Negative for environmental allergies.  Psychiatric/Behavioral: Negative for depression, hallucinations, substance abuse and suicidal ideas. The patient is not nervous/anxious and does not have insomnia.    BP 100/70   Pulse (!) 53   Temp 97.8 F (36.6 C) (Oral)   Resp 16   Ht 5\' 9"  (1.753 m)   Wt 208 lb (94.3 kg)   SpO2 98%   BMI 30.72 kg/m   Physical Exam  Constitutional: He is oriented to person, place, and time and well-developed, well-nourished, and in no distress.  HENT:  Head: Normocephalic and atraumatic.  Right Ear: External ear normal.  Left Ear: External ear normal.  Nose: Nose normal.  Mouth/Throat: Oropharynx is clear and moist. No oropharyngeal exudate.  Eyes: Conjunctivae and EOM are normal. Pupils are equal, round, and reactive to light.  Neck: Neck supple. No thyromegaly present.  Cardiovascular: Normal rate, regular rhythm, normal heart sounds and intact distal pulses.  Pulmonary/Chest: Effort normal and breath sounds normal. No respiratory distress. He has no wheezes. He has no rales. He exhibits no tenderness.  Abdominal: Soft. Bowel sounds are normal. He exhibits no distension and no mass. There is no tenderness. There is no rebound  and no guarding.  Genitourinary: Testes/scrotum normal and penis normal. No discharge found.  Lymphadenopathy:    He has no cervical adenopathy.  Neurological: He is alert and oriented to person, place, and time.  Skin: Skin is warm and dry. No rash noted.  Psychiatric: Affect normal.  Vitals reviewed.  Assessment/Plan: GERD (gastroesophageal reflux disease) Continue PPI for now. Dietary and exercise recommendations reviewed. Want to wean down to least amount of PPI needed over time to prevent long-term effects.   Anxiety and depression Notes some decrease in effectiveness of Sertraline. Discussed recommendations with patient. He wishes to discuss with wife first and will  call back.   Hyperlipidemia Taking medications as directed. Discussed importance of diet and exercise. Recommendations reviewed. He is to continue working on food choices and working up to 150 minutes of moderate-intensity aerobic exercise per week. Will monitor. Fasting lipids and CMP today.   Visit for preventive health examination Depression screen negative. Health Maintenance reviewed -- Declines flu shot. Tetanus up-to-date. Preventive schedule discussed and handout given in AVS. Will obtain fasting labs today.     Leeanne Rio, PA-C

## 2018-01-03 NOTE — Assessment & Plan Note (Signed)
Notes some decrease in effectiveness of Sertraline. Discussed recommendations with patient. He wishes to discuss with wife first and will call back.

## 2018-01-03 NOTE — Assessment & Plan Note (Signed)
Continue PPI for now. Dietary and exercise recommendations reviewed. Want to wean down to least amount of PPI needed over time to prevent long-term effects.

## 2018-01-03 NOTE — Patient Instructions (Signed)
Please go to the lab for blood work.   Our office will call you with your results unless you have chosen to receive results via MyChart.  If your blood work is normal we will follow-up each year for physicals and as scheduled for chronic medical problems.  If anything is abnormal we will treat accordingly and get you in for a follow-up.  Please continue current medication regimen.  Again I would recommend that if we want to change the Sertraline, that we try Trintellix first. If that is not covered then I would recommend something like a Lexapro. Talk to your wife about this and let me know.     Preventive Care 18-39 Years, Male Preventive care refers to lifestyle choices and visits with your health care provider that can promote health and wellness. What does preventive care include?  A yearly physical exam. This is also called an annual well check.  Dental exams once or twice a year.  Routine eye exams. Ask your health care provider how often you should have your eyes checked.  Personal lifestyle choices, including: ? Daily care of your teeth and gums. ? Regular physical activity. ? Eating a healthy diet. ? Avoiding tobacco and drug use. ? Limiting alcohol use. ? Practicing safe sex. What happens during an annual well check? The services and screenings done by your health care provider during your annual well check will depend on your age, overall health, lifestyle risk factors, and family history of disease. Counseling Your health care provider may ask you questions about your:  Alcohol use.  Tobacco use.  Drug use.  Emotional well-being.  Home and relationship well-being.  Sexual activity.  Eating habits.  Work and work Statistician.  Screening You may have the following tests or measurements:  Height, weight, and BMI.  Blood pressure.  Lipid and cholesterol levels. These may be checked every 5 years starting at age 65.  Diabetes screening. This is done  by checking your blood sugar (glucose) after you have not eaten for a while (fasting).  Skin check.  Hepatitis C blood test.  Hepatitis B blood test.  Sexually transmitted disease (STD) testing.  Discuss your test results, treatment options, and if necessary, the need for more tests with your health care provider. Vaccines Your health care provider may recommend certain vaccines, such as:  Influenza vaccine. This is recommended every year.  Tetanus, diphtheria, and acellular pertussis (Tdap, Td) vaccine. You may need a Td booster every 10 years.  Varicella vaccine. You may need this if you have not been vaccinated.  HPV vaccine. If you are 4 or younger, you may need three doses over 6 months.  Measles, mumps, and rubella (MMR) vaccine. You may need at least one dose of MMR.You may also need a second dose.  Pneumococcal 13-valent conjugate (PCV13) vaccine. You may need this if you have certain conditions and have not been vaccinated.  Pneumococcal polysaccharide (PPSV23) vaccine. You may need one or two doses if you smoke cigarettes or if you have certain conditions.  Meningococcal vaccine. One dose is recommended if you are age 69-21 years and a first-year college student living in a residence hall, or if you have one of several medical conditions. You may also need additional booster doses.  Hepatitis A vaccine. You may need this if you have certain conditions or if you travel or work in places where you may be exposed to hepatitis A.  Hepatitis B vaccine. You may need this if you have certain  conditions or if you travel or work in places where you may be exposed to hepatitis B.  Haemophilus influenzae type b (Hib) vaccine. You may need this if you have certain risk factors.  Talk to your health care provider about which screenings and vaccines you need and how often you need them. This information is not intended to replace advice given to you by your health care provider.  Make sure you discuss any questions you have with your health care provider. Document Released: 12/07/2001 Document Revised: 06/30/2016 Document Reviewed: 08/12/2015 Elsevier Interactive Patient Education  [employer].

## 2018-01-03 NOTE — Assessment & Plan Note (Signed)
Taking medications as directed. Discussed importance of diet and exercise. Recommendations reviewed. He is to continue working on food choices and working up to 150 minutes of moderate-intensity aerobic exercise per week. Will monitor. Fasting lipids and CMP today.

## 2018-01-03 NOTE — Assessment & Plan Note (Signed)
Depression screen negative. Health Maintenance reviewed -- Declines flu shot. Tetanus up-to-date. Preventive schedule discussed and handout given in AVS. Will obtain fasting labs today.  

## 2018-01-04 ENCOUNTER — Telehealth: Payer: Self-pay | Admitting: Emergency Medicine

## 2018-01-04 MED ORDER — AMOXICILLIN-POT CLAVULANATE 875-125 MG PO TABS: 1.0000 | ORAL_TABLET | Freq: Two times a day (BID) | ORAL | 0 refills | Status: DC

## 2018-01-04 NOTE — Telephone Encounter (Signed)
Rx for Augmentin has been sent in.  This is for dental infection. He is supposed to be calling his dentist for assessment.

## 2018-01-04 NOTE — Telephone Encounter (Signed)
Patient states that he had discussed with you getting a prescription for an antibiotic for an abcess on his back. He wanted to know if that was sent in with his other prescriptions. Please Advise.    Thanks  AP, LPN

## 2018-01-04 NOTE — Telephone Encounter (Signed)
Patient informed of Rx sent to Pharmacy   AP, LPN

## 2018-01-11 MED FILL — SERTRALINE HCL 100 MG TAB: 100 | 30 days supply | Qty: 30 | Fill #5

## 2018-01-25 ENCOUNTER — Other Ambulatory Visit: Payer: Self-pay | Admitting: Physician Assistant

## 2018-01-25 ENCOUNTER — Other Ambulatory Visit: Payer: Self-pay | Admitting: *Deleted

## 2018-01-25 DIAGNOSIS — F5101 Primary insomnia: Secondary | ICD-10-CM

## 2018-01-25 DIAGNOSIS — E785 Hyperlipidemia, unspecified: Secondary | ICD-10-CM

## 2018-01-25 MED ORDER — TRAZODONE HCL 50 MG PO TABS: 25.0000 mg | ORAL_TABLET | Freq: Every day | ORAL | 3 refills | Status: DC

## 2018-01-25 MED ORDER — ATORVASTATIN CALCIUM 20 MG PO TABS: 20.0000 mg | ORAL_TABLET | Freq: Every day | ORAL | 5 refills | Status: DC

## 2018-01-25 MED ORDER — SERTRALINE HCL 100 MG PO TABS: 100.0000 mg | ORAL_TABLET | Freq: Every day | ORAL | 5 refills | Status: DC

## 2018-01-25 MED ORDER — FENOFIBRATE 160 MG PO TABS: 160.0000 mg | ORAL_TABLET | Freq: Every day | ORAL | 5 refills | Status: DC

## 2018-01-25 MED FILL — FENOFIBRATE 160 MG TABLET: 160 | 30 days supply | Qty: 30 | Fill #0

## 2018-01-25 MED FILL — ATORVASTATIN 20 MG TABLET: 20 | 30 days supply | Qty: 30 | Fill #0

## 2018-01-25 MED FILL — traZODone HCL 50 MG TABS: 50 | 60 days supply | Qty: 30 | Fill #0

## 2018-01-25 NOTE — Telephone Encounter (Signed)
Lipitor, Zoloft and fenofibrate all refilled per protocol. LOV: 01/03/18  Refill request Trazodone 50 mg  LOV: 01/03/18  PCP: [place]:  verified

## 2018-01-25 NOTE — Telephone Encounter (Signed)
Copied from [place] [phone]. Topic: Quick Communication - Rx Refill/Question >> Jan 25, 2018 12:55 PM Boyd Kerbs wrote:  Medication:  atorvastatin (LIPITOR) 20 MG tablet fenofibrate 160 MG tablet traZODone (DESYREL) 50 MG tablet sertraline (ZOLOFT) 100 MG tablet  Has the patient contacted their pharmacy? Yes.    (Agent: If no, request that the patient contact the pharmacy for the refill.)  Preferred Pharmacy (with phone number or street name):  [place] [place]. [address] [place] [address] Phone: [phone] Fax: [phone]   Agent: Please be advised that RX refills may take up to 3 business days. We ask that you follow-up with your pharmacy.

## 2018-02-09 ENCOUNTER — Encounter: Payer: Self-pay | Admitting: Physician Assistant

## 2018-02-09 ENCOUNTER — Ambulatory Visit: Payer: 59 | Admitting: Physician Assistant

## 2018-02-15 ENCOUNTER — Telehealth: Payer: Self-pay | Admitting: Physician Assistant

## 2018-02-15 MED FILL — SERTRALINE HCL 100 MG TAB: 100 | 30 days supply | Qty: 30 | Fill #0

## 2018-02-15 NOTE — Telephone Encounter (Signed)
Copied from [place] [phone]. Topic: Quick Communication - Rx Refill/Question >> Feb 15, 2018 10:47 AM Bea Graff, NT wrote: Medication: sertraline (ZOLOFT) 100 MG tablet  Has the patient contacted their pharmacy? Yes.   (Agent: If no, request that the patient contact the pharmacy for the refill.) Preferred Pharmacy (with phone number or street name): [place], [place] - [address] [phone] (Phone) [phone] (Fax)   Pt called his pharmacy yesterday and they stated he does not have any refills on this medication and that he would need to contact the doctor. The chart shows 5 refills on the medication. Please verify.   Agent: Please be advised that RX refills may take up to 3 business days. We ask that you follow-up with your pharmacy.

## 2018-02-16 NOTE — Telephone Encounter (Signed)
Call placed to [facility]; inquired if pt. had any refills on file for Zoloft; was advised that pt. picked up Rx for Zoloft on 02/15/18 at approx. 12:00 PM.

## 2018-03-02 MED FILL — FENOFIBRATE 160 MG TABLET: 160 | 30 days supply | Qty: 30 | Fill #1

## 2018-03-21 MED FILL — SERTRALINE HCL 100 MG TAB: 100 | 30 days supply | Qty: 30 | Fill #1

## 2018-03-31 MED FILL — FENOFIBRATE 160 MG TABLET: 160 | 30 days supply | Qty: 30 | Fill #2

## 2018-03-31 MED FILL — traZODone HCL 50 MG TABS: 50 | 60 days supply | Qty: 30 | Fill #1

## 2018-04-26 MED FILL — SERTRALINE HCL 100 MG TAB: 100 | 30 days supply | Qty: 30 | Fill #2

## 2018-05-04 MED FILL — ATORVASTATIN 20 MG TABLET: 20 | 30 days supply | Qty: 30 | Fill #1

## 2018-05-04 MED FILL — FENOFIBRATE 160 MG TABLET: 160 | 30 days supply | Qty: 30 | Fill #3

## 2018-05-30 MED FILL — FENOFIBRATE 160 MG TABLET: 160 | 30 days supply | Qty: 30 | Fill #4

## 2018-05-30 MED FILL — ATORVASTATIN CALCIUM 20 MG: 20 | 30 days supply | Qty: 30 | Fill #2

## 2018-05-30 MED FILL — SERTRALINE HCL 100 MG TAB: 100 | 30 days supply | Qty: 30 | Fill #3

## 2018-06-12 MED FILL — traZODone HCL 50 MG TABS: 50 | 60 days supply | Qty: 30 | Fill #2

## 2018-07-04 ENCOUNTER — Other Ambulatory Visit (INDEPENDENT_AMBULATORY_CARE_PROVIDER_SITE_OTHER): Payer: 59

## 2018-07-04 ENCOUNTER — Ambulatory Visit: Payer: 59 | Admitting: Physician Assistant

## 2018-07-04 DIAGNOSIS — E785 Hyperlipidemia, unspecified: Secondary | ICD-10-CM | POA: Diagnosis not present

## 2018-07-04 LAB — LIPID PANEL
CHOL/HDL RATIO: 5
Cholesterol: 161 mg/dL (ref 0–200)
HDL: 35.1 mg/dL — ABNORMAL LOW (ref >39.00)
LDL CALC: 94 mg/dL (ref 0–99)
NonHDL: 125.58
Triglycerides: 159 mg/dL — ABNORMAL HIGH (ref 0.0–149.0)
VLDL: 31.8 mg/dL (ref 0.0–40.0)

## 2018-07-04 LAB — HEPATIC FUNCTION PANEL
ALT: 21 U/L (ref 0–53)
AST: 14 U/L (ref 0–37)
Albumin: 4.7 g/dL (ref 3.5–5.2)
Alkaline Phosphatase: 38 U/L — ABNORMAL LOW (ref 39–117)
BILIRUBIN DIRECT: 0.1 mg/dL (ref 0.0–0.3)
BILIRUBIN TOTAL: 0.7 mg/dL (ref 0.2–1.2)
TOTAL PROTEIN: 7.2 g/dL (ref 6.0–8.3)

## 2018-07-06 MED FILL — ATORVASTATIN CALCIUM 20 MG: 20 | 30 days supply | Qty: 30 | Fill #3

## 2018-07-06 MED FILL — FENOFIBRATE 160 MG TABLET: 160 | 30 days supply | Qty: 30 | Fill #5

## 2018-07-06 MED FILL — SERTRALINE HCL 100 MG TAB: 100 | 30 days supply | Qty: 30 | Fill #4

## 2018-07-28 DIAGNOSIS — R52 Pain, unspecified: Secondary | ICD-10-CM | POA: Diagnosis not present

## 2018-07-28 DIAGNOSIS — B349 Viral infection, unspecified: Secondary | ICD-10-CM | POA: Diagnosis not present

## 2018-08-14 ENCOUNTER — Other Ambulatory Visit: Payer: Self-pay | Admitting: Physician Assistant

## 2018-08-14 DIAGNOSIS — E785 Hyperlipidemia, unspecified: Secondary | ICD-10-CM

## 2018-08-14 MED FILL — traZODone HCL 50 MG TABS: 50 | 60 days supply | Qty: 30 | Fill #3

## 2018-08-14 MED FILL — SERTRALINE HCL 100 MG TAB: 100 | 30 days supply | Qty: 30 | Fill #5

## 2018-08-14 MED FILL — ATORVASTATIN CALCIUM 20 MG: 20 | 30 days supply | Qty: 30 | Fill #4

## 2018-08-15 MED FILL — FENOFIBRATE 160 MG TABLET: 160 | 30 days supply | Qty: 30 | Fill #0

## 2018-08-17 DIAGNOSIS — M25561 Pain in right knee: Secondary | ICD-10-CM | POA: Diagnosis not present

## 2018-09-14 MED FILL — ATORVASTATIN CALCIUM 20 MG: 20 | 30 days supply | Qty: 30 | Fill #5

## 2018-09-14 MED FILL — FENOFIBRATE 160 MG TABLET: 160 | 30 days supply | Qty: 30 | Fill #1

## 2018-10-02 ENCOUNTER — Other Ambulatory Visit: Payer: Self-pay | Admitting: Physician Assistant

## 2018-10-02 MED FILL — SERTRALINE HCL 100 MG TAB: 100 | 30 days supply | Qty: 30 | Fill #0

## 2018-10-13 ENCOUNTER — Other Ambulatory Visit: Payer: Self-pay | Admitting: Physician Assistant

## 2018-10-13 DIAGNOSIS — F5101 Primary insomnia: Secondary | ICD-10-CM

## 2018-10-13 DIAGNOSIS — E785 Hyperlipidemia, unspecified: Secondary | ICD-10-CM

## 2018-10-13 MED FILL — FENOFIBRATE 160 MG TABLET: 160 | 30 days supply | Qty: 30 | Fill #2

## 2018-10-13 MED FILL — ATORVASTATIN CALCIUM 20 MG: 20 | 30 days supply | Qty: 30 | Fill #0

## 2018-10-13 MED FILL — traZODone HCL 50 MG TABS: 50 | 60 days supply | Qty: 30 | Fill #0

## 2018-11-07 ENCOUNTER — Ambulatory Visit (INDEPENDENT_AMBULATORY_CARE_PROVIDER_SITE_OTHER): Payer: No Typology Code available for payment source | Admitting: Physician Assistant

## 2018-11-07 ENCOUNTER — Other Ambulatory Visit: Payer: Self-pay

## 2018-11-07 ENCOUNTER — Encounter: Payer: Self-pay | Admitting: Physician Assistant

## 2018-11-07 VITALS — BP 122/80 | HR 63 | Temp 98.5°F | Resp 14 | Ht 69.0 in | Wt 213.0 lb

## 2018-11-07 DIAGNOSIS — M545 Low back pain: Secondary | ICD-10-CM

## 2018-11-07 DIAGNOSIS — G8929 Other chronic pain: Secondary | ICD-10-CM

## 2018-11-07 DIAGNOSIS — R109 Unspecified abdominal pain: Secondary | ICD-10-CM | POA: Diagnosis not present

## 2018-11-07 LAB — POCT URINALYSIS DIPSTICK
BILIRUBIN UA: NEGATIVE
GLUCOSE UA: NEGATIVE
KETONES UA: NEGATIVE
Leukocytes, UA: NEGATIVE
Nitrite, UA: NEGATIVE
PH UA: 7 (ref 5.0–8.0)
Protein, UA: NEGATIVE
RBC UA: NEGATIVE
Spec Grav, UA: 1.015 (ref 1.010–1.025)
UROBILINOGEN UA: 1 U/dL

## 2018-11-07 MED ORDER — HYDROCODONE-ACETAMINOPHEN 5-325 MG PO TABS: 1.0000 | ORAL_TABLET | Freq: Four times a day (QID) | ORAL | 0 refills | Status: DC | PRN

## 2018-11-07 NOTE — Patient Instructions (Addendum)
Please go to the front desk to speak with Levada Dy who is getting your imaging scheduled. Urine looks good here in office but I have concern for a stone giving chronicity and characteristic of pain along with intermittent urinary symptoms. Continue Ibuprofen for milder pain. The Hydrocodone is in case of severe pain only. Take sparingly.  Apply heat to the area in 10-15 minute intervals. Limit heavy lifting or overexertion.

## 2018-11-07 NOTE — Progress Notes (Signed)
Patient presents to clinic today c/o back pain over the past couple of months.  Pt describes dull L lower back pain roughly 10cm above iliac crest. Pt able to bend forward, backward and laterally but states that with forward flexion, back pain is exacerbated. This morning with walking, pain radiates laterally to left side and hip. Pt denies tenderness to palpation in area, swelling or erythema, and warmth in area.  Pt denies pain with urination, hematuria, Pt does endorse some occasional feeling of urinary urgency with back pain.  Past Medical History:  Diagnosis Date  . Anxiety   . Arthritis   . Chest pain around 12-02-2014  . GERD (gastroesophageal reflux disease)   . Hypercholesteremia     Current Outpatient Medications on File Prior to Visit  Medication Sig Dispense Refill  . ALPRAZolam (XANAX) 0.5 MG tablet Take up to twice daily as needed for breakthrough anxiety 60 tablet 0  . atorvastatin (LIPITOR) 20 MG tablet TAKE 1 TABLET (20 MG TOTAL) BY MOUTH DAILY. 30 tablet 5  . esomeprazole (NEXIUM) 20 MG capsule Take 20 mg by mouth daily at 12 noon.    . fenofibrate 160 MG tablet TAKE 1 TABLET (160 MG TOTAL) BY MOUTH DAILY. 30 tablet 5  . ibuprofen (ADVIL,MOTRIN) 200 MG tablet Take 200 mg by mouth every 6 (six) hours as needed for mild pain or moderate pain.    Marland Kitchen sertraline (ZOLOFT) 100 MG tablet TAKE 1 TABLET (100 MG TOTAL) BY MOUTH DAILY. 30 tablet 5  . traZODone (DESYREL) 50 MG tablet TAKE 1/2 TABLET (25 MG TOTAL) BY MOUTH AT BEDTIME 30 tablet 3   No current facility-administered medications on file prior to visit.     No Known Allergies  Family History  Problem Relation Age of Onset  . Healthy Mother        Living  . Hypertension Father        Living  . Non-Hodgkin's lymphoma Father   . Diabetes Father   . Hypertension Other        Paternal side  . Heart attack Paternal Grandfather   . Healthy Sister        x1  . Healthy Daughter        x3    Social History    Socioeconomic History  . Marital status: Married    Spouse name: Not on file  . Number of children: Not on file  . Years of education: Not on file  . Highest education level: Not on file  Occupational History  . Not on file  Social Needs  . Financial resource strain: Not on file  . Food insecurity:    Worry: Not on file    Inability: Not on file  . Transportation needs:    Medical: Not on file    Non-medical: Not on file  Tobacco Use  . Smoking status: Former Smoker    Packs/day: 0.25    Years: 5.00    Pack years: 1.25    Last attempt to quit: 12/26/2015    Years since quitting: 2.8  . Smokeless tobacco: Never Used  Substance and Sexual Activity  . Alcohol use: Yes    Alcohol/week: 6.0 standard drinks    Types: 6 Cans of beer per week    Comment: occasional  . Drug use: No  . Sexual activity: Yes    Birth control/protection: None  Lifestyle  . Physical activity:    Days per week: Not on file    Minutes  per session: Not on file  . Stress: Not on file  Relationships  . Social connections:    Talks on phone: Not on file    Gets together: Not on file    Attends religious service: Not on file    Active member of club or organization: Not on file    Attends meetings of clubs or organizations: Not on file    Relationship status: Not on file  Other Topics Concern  . Not on file  Social History Narrative  . Not on file   Review of Systems - See HPI.  All other ROS are negative.  BP 122/80   Pulse 63   Temp 98.5 F (36.9 C) (Oral)   Resp 14   Ht 5\' 9"  (1.753 m)   Wt 213 lb (96.6 kg)   SpO2 99%   BMI 31.45 kg/m   Physical Exam Cardiovascular:     Rate and Rhythm: Normal rate and regular rhythm.     Heart sounds: Normal heart sounds.  Pulmonary:     Effort: Pulmonary effort is normal.     Breath sounds: Normal breath sounds.  Musculoskeletal:     Lumbar back: He exhibits decreased range of motion and pain. He exhibits no tenderness, no swelling, no edema  and no deformity.       Back:  Neurological:     Mental Status: He is alert and oriented to person, place, and time.     Motor: Motor function is intact.     Assessment/Plan: 1. Chronic left-sided low back pain without sciatica 2. Left flank pain Question stone giving location, chronicity and characteristic of symptoms. Will check CT renal study today. Supportive measures and OTC medications reviewed. RX Short course of Norco for more severe pain.  - CT RENAL STONE STUDY; Future - POCT urinalysis dipstick - HYDROcodone-acetaminophen (NORCO/VICODIN) 5-325 MG tablet; Take 1 tablet by mouth every 6 (six) hours as needed for moderate pain.  Dispense: 15 tablet; Refill: 0       Leeanne Rio, PA-C

## 2018-11-08 ENCOUNTER — Ambulatory Visit
Admission: RE | Admit: 2018-11-08 | Discharge: 2018-11-08 | Disposition: A | Discharge status: Home / Self Care | Payer: No Typology Code available for payment source | Source: Ambulatory Visit | Attending: Physician Assistant | Admitting: Physician Assistant

## 2018-11-08 ENCOUNTER — Other Ambulatory Visit: Payer: Self-pay | Admitting: Physician Assistant

## 2018-11-08 DIAGNOSIS — M545 Low back pain, unspecified: Secondary | ICD-10-CM

## 2018-11-08 DIAGNOSIS — R109 Unspecified abdominal pain: Secondary | ICD-10-CM

## 2018-11-08 DIAGNOSIS — G8929 Other chronic pain: Secondary | ICD-10-CM

## 2018-11-13 MED FILL — SERTRALINE HCL 100 MG TAB: 100 | 30 days supply | Qty: 30 | Fill #1

## 2018-11-13 MED FILL — FENOFIBRATE 160 MG TABLET: 160 | 30 days supply | Qty: 30 | Fill #3

## 2018-11-13 MED FILL — ATORVASTATIN CALCIUM 20 MG: 20 | 30 days supply | Qty: 30 | Fill #1

## 2018-12-14 MED FILL — ATORVASTATIN 20 MG TABLET: 20 | 30 days supply | Qty: 30 | Fill #2 | Status: TO

## 2018-12-14 MED FILL — traZODone HCL 50 MG TABS: 50 | 60 days supply | Qty: 30 | Fill #1 | Status: TO

## 2018-12-14 MED FILL — FENOFIBRATE 160 MG TABLET: 160 | 30 days supply | Qty: 30 | Fill #4 | Status: TO

## 2018-12-25 MED FILL — SERTRALINE HCL 100 MG TAB: 100 | 30 days supply | Qty: 30 | Fill #2 | Status: TO

## 2019-01-19 MED FILL — ATORVASTATIN 20 MG TABLET: 20 | 30 days supply | Qty: 30 | Fill #0

## 2019-01-19 MED FILL — FENOFIBRATE 160 MG TABLET: 160 | 30 days supply | Qty: 30 | Fill #0

## 2019-01-19 MED FILL — SERTRALINE HCL 100 MG TAB: 100 | 30 days supply | Qty: 30 | Fill #0

## 2019-02-14 MED FILL — ATORVASTATIN 20 MG TABLET: 20 | 30 days supply | Qty: 30 | Fill #1

## 2019-02-14 MED FILL — traZODone HCL 50 MG TABS: 50 | 60 days supply | Qty: 30 | Fill #0

## 2019-02-14 MED FILL — SERTRALINE HCL 100 MG TAB: 100 | 30 days supply | Qty: 30 | Fill #1

## 2019-04-04 ENCOUNTER — Other Ambulatory Visit: Payer: Self-pay | Admitting: Physician Assistant

## 2019-04-04 ENCOUNTER — Encounter: Payer: Self-pay | Admitting: Emergency Medicine

## 2019-04-04 DIAGNOSIS — E785 Hyperlipidemia, unspecified: Secondary | ICD-10-CM

## 2019-04-04 MED FILL — ATORVASTATIN 20 MG TABLET: 20 | 30 days supply | Qty: 30 | Fill #2

## 2019-04-04 MED FILL — traZODone HCL 50 MG TABS: 50 | 60 days supply | Qty: 30 | Fill #1

## 2019-04-04 MED FILL — FENOFIBRATE 160 MG TABLET: 160 | 30 days supply | Qty: 30 | Fill #0

## 2019-04-04 MED FILL — SERTRALINE HCL 100 MG TAB: 100 | 30 days supply | Qty: 30 | Fill #2

## 2019-04-08 ENCOUNTER — Ambulatory Visit (INDEPENDENT_AMBULATORY_CARE_PROVIDER_SITE_OTHER): Payer: Self-pay | Admitting: Physician Assistant

## 2019-04-08 ENCOUNTER — Other Ambulatory Visit: Payer: Self-pay

## 2019-04-08 VITALS — BP 122/84 | HR 60 | Temp 97.6°F | Resp 16 | Wt 205.0 lb

## 2019-04-08 DIAGNOSIS — S61212A Laceration without foreign body of right middle finger without damage to nail, initial encounter: Secondary | ICD-10-CM

## 2019-04-08 DIAGNOSIS — R55 Syncope and collapse: Secondary | ICD-10-CM

## 2019-04-08 MED ORDER — MUPIROCIN 2 % EX OINT: 1.0000 "application " | TOPICAL_OINTMENT | Freq: Three times a day (TID) | CUTANEOUS | 0 refills | Status: DC

## 2019-04-08 NOTE — Patient Instructions (Signed)
WOUND CARE Please return in 10 days to have your stitches/staples removed or sooner if you have concerns. Marland Kitchen Keep area clean and dry for 24 hours. Do not remove bandage, if applied. . After 24 hours, remove bandage and wash wound gently with mild soap and warm water. Reapply a new bandage after cleaning wound, if directed. . Continue daily cleansing with soap and water until stitches/staples are removed. . Notify the office if you experience any of the following signs of infection: Swelling, redness, pus drainage, streaking, fever >101.0 F . Notify the office if you experience excessive bleeding that does not stop after 15-20 minutes of constant, firm pressure.   Keep finger in splinted position for at least 7 days to promote better healing.  Use ointment on the abrasions of left hand.  Avoid contaminated water.    Sutures, Staples, or Adhesive Wound Closure Doctors use stitches (sutures), staples, and glue (skin adhesives) to hold your skin together while it heals (wound closure). What your doctor uses depends on your wound.  In most cases, your wound will be closed right away (primary skin closure). Sometimes it may be closed later so that it can be cleaned and then heal naturally (delayed wound closure). What are the types of wound closure? Adhesive glue  To use adhesive glue, your doctor: ? Holds the edges of the wound together. ? Paints the glue onto your skin. You may need more than one layer. ? Covers your wound with a bandage (dressing) after the glue is dry.  Adhesive glue may be used for: ? Wounds that are not deep (superficial wounds). ? Wounds on the face. ? Children's wounds.  Adhesive glue is not used inside of wounds, or on wounds that are: ? Deep. ? Uneven. ? Bleeding.  Some benefits of adhesive glue are: ? It leaves nothing that needs to be removed. ? You do not need medicine to numb the area. ? You have less pain than with other types of closure. Adhesive  strips Adhesive strips are:  Made of paper that is sticky (adhesive) and has many small holes it in (porous).  Placed across your wound edges, like a normal bandage.  Used to close very shallow wounds.  Sometimes used with sutures to help improve closure. Sutures Sutures come in many different materials, strengths, and sizes. Some sutures break down as your wound heals (absorbable). Other sutures need to be removed (nonabsorbable). To use sutures, your doctor:  Sews your skin together with sutures and a needle.  May use one long (continuous) stitch or separate stitches.  Ties and cuts the sutures at the end. Sutures can be used for all types of wounds, including under the skin. They can cause a skin reaction that can lead to infection. Staples Staples are often used to close surgical cuts (incisions). To use staples, your doctor:  Holds the edges of your wound close together.  Places a staple across the wound.  Uses a tool to secure the staple to the skin.  Repeats this with as many staples as needed. Staples are faster to use than sutures, and they cause less reaction from your skin. Staples need to be removed using a tool that bends the staples away from your skin. Follow these instructions at home: Medicines  Take over-the-counter and prescription medicines only as told by your doctor.  If you were prescribed an antibiotic medicine, take it as told by your doctor. Do not stop taking the antibiotic even if you start to feel  better. Wound care   Follow instructions from your doctor about how to take care of your wound and bandage.  Wash your hands with soap and water before and after touching your wound or bandage. If you cannot use soap and water, use hand sanitizer.  Do not try to remove your wound closures unless your doctor tells you to do that. You may need a follow-up visit for your doctor to remove your closures. ? Closures may stay in place for 2 weeks or longer.  ? Absorbable sutures may break down after a few days or weeks. ? If adhesive strip edges start to loosen and curl up, you may trim the loose edges.  Do not pick at your wound. Picking can cause an infection.  Apply ointments or creams only as told by your doctor.  Check your wound every day for signs of infection. Check for: ? Redness, swelling, or pain. ? Fluid or blood. ? Warmth. ? Pus or a bad smell. General instructions  Do not take baths, swim, or use a hot tub until your doctor approves. Ask your doctor if you may take showers. You may only be allowed to take sponge baths.  Do not soak your wound in water.  Keep all follow-up visits as told by your doctor. This is important. Contact a doctor if you:  Have any of the following: ? A fever. ? Chills. ? Redness, swelling, or pain around your wound. ? Fluid or blood coming from your wound. ? Pus or a bad smell coming from your wound.  Notice that your wound feels warm to the touch.  Notice that the edges of your wound start to separate after your sutures come out.  Notice that your wound becomes thick, raised, and darker in color after your sutures come out (scarring). Summary  What your doctor uses to hold your skin together while it heals (wound closure) depends on your wound.  Your doctor may use stitches (sutures), staples, and glue (skin adhesives).  Do not try to remove your wound closures unless your doctor tells you to do that.  Do not soak your wound in water. This information is not intended to replace advice given to you by your health care provider. Make sure you discuss any questions you have with your health care provider. Document Released: 08/08/2009 Document Revised: 08/18/2017 Document Reviewed: 08/18/2017 Elsevier Interactive Patient Education  2019 [employer].

## 2019-04-08 NOTE — Progress Notes (Signed)
MYERS TUTTEROW  MRN: [id number] DOB: [birth year]  Subjective:  Jerry Lopez is a 38 y.o. male seen in office today for a chief complaint of laceration to right middle finger x 1 hour prior to arrival.  He was messing around on his trailer and a dirty wire snapped and caught him on his right hand.  He also has a few abrasions on his left hand.  He immediately cleaned the wound with soap and water and applied pressure.  Notes the bleeding stopped after about 5 minutes.  He denies any numbness, tingling, loss of sensation, decreased range of motion, fever, chills, purulent drainage.  He does not take any anticoagulation.  Denies PMH of diabetes, hypertension, HIV, and autoimmune disease.  Last had Tdap in 10/2013.  Review of Systems  Neurological: Negative for weakness.       Patient Active Problem List   Diagnosis Date Noted  . Visit for preventive health examination 01/03/2018  . Physical deconditioning 11/21/2016  . Chronic pain of right inguinal region 10/08/2014  . Hyperlipidemia 05/23/2014  . Insomnia 11/15/2013  . Encounter for preventive health examination 08/08/2013  . Anxiety and depression 08/08/2013  . GERD (gastroesophageal reflux disease) 08/08/2013  . Needs smoking cessation education 08/08/2013    Current Outpatient Medications on File Prior to Visit  Medication Sig Dispense Refill  . ALPRAZolam (XANAX) 0.5 MG tablet Take up to twice daily as needed for breakthrough anxiety 60 tablet 0  . atorvastatin (LIPITOR) 20 MG tablet TAKE 1 TABLET (20 MG TOTAL) BY MOUTH DAILY. 30 tablet 5  . esomeprazole (NEXIUM) 20 MG capsule Take 20 mg by mouth daily at 12 noon.    . fenofibrate 160 MG tablet Take 1 tablet (160 mg total) by mouth daily. Overdue for physical. Please call the office to schedule. [phone] 30 tablet 0  . ibuprofen (ADVIL,MOTRIN) 200 MG tablet Take 200 mg by mouth every 6 (six) hours as needed for mild pain or moderate pain.    Marland Kitchen sertraline (ZOLOFT) 100 MG  tablet TAKE 1 TABLET (100 MG TOTAL) BY MOUTH DAILY. 30 tablet 5  . traZODone (DESYREL) 50 MG tablet TAKE 1/2 TABLET (25 MG TOTAL) BY MOUTH AT BEDTIME 30 tablet 3  . HYDROcodone-acetaminophen (NORCO/VICODIN) 5-325 MG tablet Take 1 tablet by mouth every 6 (six) hours as needed for moderate pain. (Patient not taking: Reported on 04/08/2019) 15 tablet 0   No current facility-administered medications on file prior to visit.     No Known Allergies   Objective:  BP 122/84 (BP Location: Left Arm, Patient Position: Sitting, Cuff Size: Large)   Pulse 60   Temp 97.6 F (36.4 C) (Oral)   Resp 16   Wt 205 lb (93 kg)   SpO2 98%   BMI 30.27 kg/m   Physical Exam Vitals signs reviewed.  HENT:     Head: Normocephalic and atraumatic.  Eyes:     Conjunctiva/sclera: Conjunctivae normal.  Neck:     Musculoskeletal: Normal range of motion.  Cardiovascular:     Pulses:          Radial pulses are 2+ on the right side and 2+ on the left side.  Pulmonary:     Effort: Pulmonary effort is normal.  Musculoskeletal:     Right hand: He exhibits tenderness ( surrounding laceration) and laceration (~2cm curved well approximated laceration on dorsal aspect of 3rd PIP joint of right hand, see image below). He exhibits normal range of motion, no bony tenderness, normal  capillary refill and no swelling. Normal sensation noted. Normal strength noted.     Comments: No decreased ROM of right 3rd digit PIP and MCP joint. Strength of right 3rd digit 5/5. Sensation intact.   Skin:    General: Skin is warm and dry.     Findings: Abrasion (3 small abrasions noted on dorsal aspect of left 1-3 digits) present.  Neurological:     Mental Status: He is alert and oriented to person, place, and time.         Wound Care: Abrasions of left hand were cleansed thoroughly with soap and water. Bacitracin ointment and dressing applied.    PROCEDURE NOTE: laceration repair Verbal consent obtained from patient.  Digital block  with 2.5 cc Lidocaine 1% without epinephrine.  Wound explored for tendon, ligament damage. Wound scrubbed with soap and water and rinsed. Wound closed with #5 5-0 Prolene simple interrupted sutures.  Wound cleansed and dressed. Finger splint applied with tongue depressor and coban.    Of note, patient did experience vasovagal episode during laceration repair.  Patient reports becoming very hot and sweaty during repair.  He reports hearing some ringing in his ears and had to close his eyes.  Denies dizziness, nausea, and vomiting.  He appeared diaphoretic, pale, and did briefly close his eyes. We had him in supine position.  CMA Katharine Look fanned patient in place ice pack on back of neck.  Heart rate and BP reevaluated.  Pulse was 63 bpm.  BP 110/60.  Duration of episode was approximately 30 seconds.  Time to fully recover was about 4 to 5 minutes.  He was no longer diaphoretic and skin color returned to normal. After resolution, laceration repaired was completed.  I contacted supervising physician, Dr. Sabra Heck, to notify him of the incident.  He agreed with plan.   Assessment and Plan :  1. Laceration of right middle finger without foreign body without damage to nail, initial encounter Pt is NVI. Updated tdap since > 5years and mechanism of injury. Laceration repair completed. Wound care instructions provided. Return in 10 days for suture removal. Return sooner if develops any signs of infection. Pt voices understanding.   2. Vasovagal episode Pt experienced brief vasovagal episode during laceration repair. Resolved. Repeat HR and BP WNL.    Tenna Delaine, Hershal Coria  [facility] 04/10/2019 4:08 PM

## 2019-05-11 ENCOUNTER — Other Ambulatory Visit: Payer: Self-pay | Admitting: Physician Assistant

## 2019-05-11 DIAGNOSIS — E785 Hyperlipidemia, unspecified: Secondary | ICD-10-CM

## 2019-05-11 MED FILL — ATORVASTATIN 20 MG TABLET: 20 | 30 days supply | Qty: 30 | Fill #0

## 2019-05-28 ENCOUNTER — Other Ambulatory Visit: Payer: Self-pay | Admitting: Physician Assistant

## 2019-05-28 ENCOUNTER — Encounter: Payer: Self-pay | Admitting: Emergency Medicine

## 2019-05-28 DIAGNOSIS — E785 Hyperlipidemia, unspecified: Secondary | ICD-10-CM

## 2019-06-04 ENCOUNTER — Other Ambulatory Visit: Payer: Self-pay

## 2019-06-04 ENCOUNTER — Ambulatory Visit (INDEPENDENT_AMBULATORY_CARE_PROVIDER_SITE_OTHER): Payer: No Typology Code available for payment source | Admitting: Physician Assistant

## 2019-06-04 ENCOUNTER — Encounter: Payer: Self-pay | Admitting: Physician Assistant

## 2019-06-04 VITALS — BP 118/80 | HR 83 | Temp 98.6°F | Resp 16 | Ht 69.5 in | Wt 211.0 lb

## 2019-06-04 DIAGNOSIS — Z0000 Encounter for general adult medical examination without abnormal findings: Secondary | ICD-10-CM

## 2019-06-04 DIAGNOSIS — E785 Hyperlipidemia, unspecified: Secondary | ICD-10-CM | POA: Diagnosis not present

## 2019-06-04 DIAGNOSIS — F5101 Primary insomnia: Secondary | ICD-10-CM

## 2019-06-04 LAB — LIPID PANEL
Cholesterol: 158 mg/dL (ref 0–200)
HDL: 35.6 mg/dL — ABNORMAL LOW (ref >39.00)
LDL Cholesterol: 89 mg/dL (ref 0–99)
NonHDL: 122.17
Total CHOL/HDL Ratio: 4
Triglycerides: 164 mg/dL — ABNORMAL HIGH (ref 0.0–149.0)
VLDL: 32.8 mg/dL (ref 0.0–40.0)

## 2019-06-04 LAB — CBC WITH DIFFERENTIAL/PLATELET
Basophils Absolute: 0 10*3/uL (ref 0.0–0.1)
Basophils Relative: 0.5 % (ref 0.0–3.0)
Eosinophils Absolute: 0.2 10*3/uL (ref 0.0–0.7)
Eosinophils Relative: 4.3 % (ref 0.0–5.0)
HCT: 41.5 % (ref 39.0–52.0)
Hemoglobin: 14.7 g/dL (ref 13.0–17.0)
Lymphocytes Relative: 31.1 % (ref 12.0–46.0)
Lymphs Abs: 1.7 10*3/uL (ref 0.7–4.0)
MCHC: 35.4 g/dL (ref 30.0–36.0)
MCV: 85.6 fl (ref 78.0–100.0)
Monocytes Absolute: 0.4 10*3/uL (ref 0.1–1.0)
Monocytes Relative: 7.8 % (ref 3.0–12.0)
Neutro Abs: 3.1 10*3/uL (ref 1.4–7.7)
Neutrophils Relative %: 56.3 % (ref 43.0–77.0)
Platelets: 171 10*3/uL (ref 150.0–400.0)
RBC: 4.85 Mil/uL (ref 4.22–5.81)
RDW: 12.9 % (ref 11.5–15.5)
WBC: 5.5 10*3/uL (ref 4.0–10.5)

## 2019-06-04 LAB — COMPREHENSIVE METABOLIC PANEL
ALT: 20 U/L (ref 0–53)
AST: 14 U/L (ref 0–37)
Albumin: 4.6 g/dL (ref 3.5–5.2)
Alkaline Phosphatase: 50 U/L (ref 39–117)
BUN: 26 mg/dL — ABNORMAL HIGH (ref 6–23)
CO2: 28 mEq/L (ref 19–32)
Calcium: 9.6 mg/dL (ref 8.4–10.5)
Chloride: 103 mEq/L (ref 96–112)
Creatinine, Ser: 1.12 mg/dL (ref 0.40–1.50)
GFR: 72.88 mL/min (ref >60.00)
Glucose, Bld: 109 mg/dL — ABNORMAL HIGH (ref 70–99)
Potassium: 4.4 mEq/L (ref 3.5–5.1)
Sodium: 139 mEq/L (ref 135–145)
Total Bilirubin: 0.5 mg/dL (ref 0.2–1.2)
Total Protein: 6.9 g/dL (ref 6.0–8.3)

## 2019-06-04 LAB — TSH: TSH: 0.75 u[IU]/mL (ref 0.35–4.50)

## 2019-06-04 LAB — HEMOGLOBIN A1C: Hgb A1c MFr Bld: 5.7 % (ref 4.6–6.5)

## 2019-06-04 MED ORDER — ATORVASTATIN CALCIUM 20 MG PO TABS: 20.0000 mg | ORAL_TABLET | Freq: Every day | ORAL | 1 refills | Status: DC

## 2019-06-04 MED ORDER — SERTRALINE HCL 100 MG PO TABS: 100.0000 mg | ORAL_TABLET | Freq: Every day | ORAL | 1 refills | Status: DC

## 2019-06-04 MED ORDER — ESOMEPRAZOLE MAGNESIUM 20 MG PO CPDR: 20.0000 mg | DELAYED_RELEASE_CAPSULE | Freq: Every day | ORAL | 1 refills | Status: DC

## 2019-06-04 MED ORDER — TRAZODONE HCL 50 MG PO TABS: 50.0000 mg | ORAL_TABLET | Freq: Every day | ORAL | 1 refills | Status: DC

## 2019-06-04 MED ORDER — FENOFIBRATE 160 MG PO TABS: 160.0000 mg | ORAL_TABLET | Freq: Every day | ORAL | 1 refills | Status: DC

## 2019-06-04 NOTE — Progress Notes (Signed)
Patient presents to clinic today for annual exam.  Patient is fasting for labs.  Chronic Issues: Hyperlipidemia -- Is currently on a regimen of Atorvastatin 20mg  and Fenofibrate 160 mg daily. Endorses taking medications as directed. Is working harder on diet -- keeping well-balanced diet and eating mainly at home. Trying to keep active.   GERD -- Currently on Nexium 20 mg daily. Endorses taking as directed. No completes control of symptoms with this regimen.  Depression and Anxiety -- Currently on a regimen of Sertraline 100 mg daily. Trazodone nightly PRN for anxiety and sleep.  Health Maintenance: Immunizations -- UTD  Past Medical History:  Diagnosis Date  . Anxiety   . Arthritis   . Chest pain around 12-02-2014  . GERD (gastroesophageal reflux disease)   . Hypercholesteremia     Past Surgical History:  Procedure Laterality Date  . ELBOW FRACTURE SURGERY Right 07.16.14   Radial Repair  . INGUINAL HERNIA REPAIR Right 01/06/2015   Procedure: LAPAROSCOPIC REPAIR OF RIGHT INGUINAL HERNIA WITH MESH;  Surgeon: Greer Pickerel, MD;  Location: [facility];  Service: General;  Laterality: Right;  . INSERTION OF MESH Right 01/06/2015   Procedure: INSERTION OF MESH;  Surgeon: Greer Pickerel, MD;  Location: [facility];  Service: General;  Laterality: Right;  . LEG SURGERY Right    lower, for fracture age 78  . VASECTOMY    . VASECTOMY REVERSAL      Current Outpatient Medications on File Prior to Visit  Medication Sig Dispense Refill  . ALPRAZolam (XANAX) 0.5 MG tablet Take up to twice daily as needed for breakthrough anxiety 60 tablet 0   No current facility-administered medications on file prior to visit.     No Known Allergies  Family History  Problem Relation Age of Onset  . Healthy Mother        Living  . Hypertension Father        Living  . Non-Hodgkin's lymphoma Father   . Diabetes Father   . Hypertension Other        Paternal side  . Heart attack Paternal Grandfather   .  Healthy Sister        x1  . Healthy Daughter        x3    Social History   Socioeconomic History  . Marital status: Married    Spouse name: Not on file  . Number of children: Not on file  . Years of education: Not on file  . Highest education level: Not on file  Occupational History  . Not on file  Social Needs  . Financial resource strain: Not on file  . Food insecurity    Worry: Not on file    Inability: Not on file  . Transportation needs    Medical: Not on file    Non-medical: Not on file  Tobacco Use  . Smoking status: Former Smoker    Packs/day: 0.25    Years: 5.00    Pack years: 1.25    Quit date: 12/26/2015    Years since quitting: 3.4  . Smokeless tobacco: Never Used  Substance and Sexual Activity  . Alcohol use: Yes    Alcohol/week: 6.0 standard drinks    Types: 6 Cans of beer per week    Comment: occasional  . Drug use: No  . Sexual activity: Yes    Birth control/protection: None  Lifestyle  . Physical activity    Days per week: Not on file    Minutes per  session: Not on file  . Stress: Not on file  Relationships  . Social Herbalist on phone: Not on file    Gets together: Not on file    Attends religious service: Not on file    Active member of club or organization: Not on file    Attends meetings of clubs or organizations: Not on file    Relationship status: Not on file  . Intimate partner violence    Fear of current or ex partner: Not on file    Emotionally abused: Not on file    Physically abused: Not on file    Forced sexual activity: Not on file  Other Topics Concern  . Not on file  Social History Narrative  . Not on file   Review of Systems  Constitutional: Negative for fever and weight loss.  HENT: Negative for ear discharge, ear pain, hearing loss and tinnitus.   Eyes: Negative for blurred vision, double vision, photophobia and pain.  Respiratory: Negative for cough and shortness of breath.   Cardiovascular: Negative for  chest pain and palpitations.  Gastrointestinal: Negative for abdominal pain, blood in stool, constipation, diarrhea, heartburn, melena, nausea and vomiting.  Genitourinary: Negative for dysuria, flank pain, frequency, hematuria and urgency.  Musculoskeletal: Negative for falls.  Neurological: Negative for dizziness, loss of consciousness and headaches.  Endo/Heme/Allergies: Negative for environmental allergies.  Psychiatric/Behavioral: Negative for depression, hallucinations, substance abuse and suicidal ideas. The patient is not nervous/anxious and does not have insomnia.    BP 118/80   Pulse 83   Temp 98.6 F (37 C) (Skin)   Resp 16   Ht 5' 9.5" (1.765 m)   Wt 211 lb (95.7 kg)   SpO2 98%   BMI 30.71 kg/m   Physical Exam Vitals signs reviewed.  Constitutional:      General: He is not in acute distress.    Appearance: He is well-developed. He is not diaphoretic.  HENT:     Head: Normocephalic and atraumatic.     Right Ear: Tympanic membrane, ear canal and external ear normal.     Left Ear: Tympanic membrane, ear canal and external ear normal.     Nose: Nose normal.     Mouth/Throat:     Pharynx: No posterior oropharyngeal erythema.  Eyes:     Conjunctiva/sclera: Conjunctivae normal.     Pupils: Pupils are equal, round, and reactive to light.  Neck:     Musculoskeletal: Neck supple.     Thyroid: No thyromegaly.  Cardiovascular:     Rate and Rhythm: Normal rate and regular rhythm.     Heart sounds: Normal heart sounds.  Pulmonary:     Effort: Pulmonary effort is normal. No respiratory distress.     Breath sounds: Normal breath sounds. No wheezing or rales.  Chest:     Chest wall: No tenderness.  Abdominal:     General: Bowel sounds are normal. There is no distension.     Palpations: Abdomen is soft. There is no mass.     Tenderness: There is no abdominal tenderness. There is no guarding or rebound.  Lymphadenopathy:     Cervical: No cervical adenopathy.  Skin:     General: Skin is warm and dry.     Findings: No rash.  Neurological:     Mental Status: He is alert and oriented to person, place, and time.     Cranial Nerves: No cranial nerve deficit.    Assessment/Plan: 1. Visit for preventive  health examination Depression screen negative. Health Maintenance reviewed. Preventive schedule discussed and handout given in AVS. Will obtain fasting labs today.  - CBC with Differential/Platelet - Comprehensive metabolic panel - Hemoglobin A1c - Lipid panel - TSH  2. Hyperlipidemia, unspecified hyperlipidemia type Taking stain as directed. Dietary and exercise recommendations again reviewed. Repeat fasting labs today.  - atorvastatin (LIPITOR) 20 MG tablet; Take 1 tablet (20 mg total) by mouth daily.  Dispense: 90 tablet; Refill: 1 - fenofibrate 160 MG tablet; Take 1 tablet (160 mg total) by mouth daily.  Dispense: 90 tablet; Refill: 1 - Comprehensive metabolic panel - Hemoglobin A1c - Lipid panel  3. Primary insomnia; Anxiety/Depression Stable. Medications refilled. Continue monitoring. - traZODone (DESYREL) 50 MG tablet; Take 1 tablet (50 mg total) by mouth at bedtime.  Dispense: 90 tablet; Refill: [place], [place]

## 2019-06-04 NOTE — Patient Instructions (Signed)
Please go to the lab for blood work.   Our office will call you with your results unless you have chosen to receive results via MyChart.  If your blood work is normal we will follow-up each year for physicals and as scheduled for chronic medical problems.  If anything is abnormal we will treat accordingly and get you in for a follow-up.    Preventive Care 21-39 Years Old, Male Preventive care refers to lifestyle choices and visits with your health care provider that can promote health and wellness. This includes:  A yearly physical exam. This is also called an annual well check.  Regular dental and eye exams.  Immunizations.  Screening for certain conditions.  Healthy lifestyle choices, such as eating a healthy diet, getting regular exercise, not using drugs or products that contain nicotine and tobacco, and limiting alcohol use. What can I expect for my preventive care visit? Physical exam Your health care provider will check:  Height and weight. These may be used to calculate body mass index (BMI), which is a measurement that tells if you are at a healthy weight.  Heart rate and blood pressure.  Your skin for abnormal spots. Counseling Your health care provider may ask you questions about:  Alcohol, tobacco, and drug use.  Emotional well-being.  Home and relationship well-being.  Sexual activity.  Eating habits.  Work and work environment. What immunizations do I need?  Influenza (flu) vaccine  This is recommended every year. Tetanus, diphtheria, and pertussis (Tdap) vaccine  You may need a Td booster every 10 years. Varicella (chickenpox) vaccine  You may need this vaccine if you have not already been vaccinated. Human papillomavirus (HPV) vaccine  If recommended by your health care provider, you may need three doses over 6 months. Measles, mumps, and rubella (MMR) vaccine  You may need at least one dose of MMR. You may also need a second dose.  Meningococcal conjugate (MenACWY) vaccine  One dose is recommended if you are 19-21 years old and a first-year college student living in a residence hall, or if you have one of several medical conditions. You may also need additional booster doses. Pneumococcal conjugate (PCV13) vaccine  You may need this if you have certain conditions and were not previously vaccinated. Pneumococcal polysaccharide (PPSV23) vaccine  You may need one or two doses if you smoke cigarettes or if you have certain conditions. Hepatitis A vaccine  You may need this if you have certain conditions or if you travel or work in places where you may be exposed to hepatitis A. Hepatitis B vaccine  You may need this if you have certain conditions or if you travel or work in places where you may be exposed to hepatitis B. Haemophilus influenzae type b (Hib) vaccine  You may need this if you have certain risk factors. You may receive vaccines as individual doses or as more than one vaccine together in one shot (combination vaccines). Talk with your health care provider about the risks and benefits of combination vaccines. What tests do I need? Blood tests  Lipid and cholesterol levels. These may be checked every 5 years starting at age 20.  Hepatitis C test.  Hepatitis B test. Screening   Diabetes screening. This is done by checking your blood sugar (glucose) after you have not eaten for a while (fasting).  Sexually transmitted disease (STD) testing. Talk with your health care provider about your test results, treatment options, and if necessary, the need for more tests. Follow   these instructions at home: Eating and drinking   Eat a diet that includes fresh fruits and vegetables, whole grains, lean protein, and low-fat dairy products.  Take vitamin and mineral supplements as recommended by your health care provider.  Do not drink alcohol if your health care provider tells you not to drink.  If you drink  alcohol: ? Limit how much you have to 0-2 drinks a day. ? Be aware of how much alcohol is in your drink. In the [place] one drink equals one 12 oz bottle of beer (355 mL), one 5 oz glass of wine (148 mL), or one 1 oz glass of hard liquor (44 mL). Lifestyle  Take daily care of your teeth and gums.  Stay active. Exercise for at least 30 minutes on 5 or more days each week.  Do not use any products that contain nicotine or tobacco, such as cigarettes, e-cigarettes, and chewing tobacco. If you need help quitting, ask your health care provider.  If you are sexually active, practice safe sex. Use a condom or other form of protection to prevent STIs (sexually transmitted infections). What's next?  Go to your health care provider once a year for a well check visit.  Ask your health care provider how often you should have your eyes and teeth checked.  Stay up to date on all vaccines. This information is not intended to replace advice given to you by your health care provider. Make sure you discuss any questions you have with your health care provider. Document Released: 12/07/2001 Document Revised: 10/05/2018 Document Reviewed: 10/05/2018 Elsevier Patient Education  2020 [employer]  

## 2019-06-06 MED FILL — ATORVASTATIN 20 MG TABLET: 20 | 90 days supply | Qty: 90 | Fill #0

## 2019-06-06 MED FILL — ESOMEPRAZOLE MAG DR 20 MG C: 20 | 90 days supply | Qty: 90 | Fill #0

## 2019-06-06 MED FILL — FENOFIBRATE 160 MG TABLET: 160 | 90 days supply | Qty: 90 | Fill #0

## 2019-06-06 MED FILL — SERTRALINE HCL 100 MG TAB: 100 | 90 days supply | Qty: 90 | Fill #0

## 2019-06-06 MED FILL — traZODone HCL 50 MG TABS: 50 | 90 days supply | Qty: 90 | Fill #0

## 2019-06-07 ENCOUNTER — Encounter: Payer: Self-pay | Admitting: Emergency Medicine

## 2019-06-07 ENCOUNTER — Other Ambulatory Visit: Payer: Self-pay | Admitting: Emergency Medicine

## 2019-06-07 DIAGNOSIS — E785 Hyperlipidemia, unspecified: Secondary | ICD-10-CM

## 2019-06-07 DIAGNOSIS — E781 Pure hyperglyceridemia: Secondary | ICD-10-CM

## 2019-06-07 DIAGNOSIS — Z8639 Personal history of other endocrine, nutritional and metabolic disease: Secondary | ICD-10-CM

## 2019-06-07 MED ORDER — VASCEPA 1 G PO CAPS: 2.0000 | ORAL_CAPSULE | Freq: Two times a day (BID) | ORAL | 1 refills | Status: DC

## 2019-09-10 MED FILL — ATORVASTATIN 20 MG TABLET: 20 | 90 days supply | Qty: 90 | Fill #1

## 2019-09-10 MED FILL — SERTRALINE HCL 100 MG TAB: 100 | 90 days supply | Qty: 90 | Fill #1

## 2019-09-10 MED FILL — FENOFIBRATE 160 MG TABLET: 160 | 90 days supply | Qty: 90 | Fill #1

## 2019-12-12 ENCOUNTER — Other Ambulatory Visit: Payer: Self-pay | Admitting: Emergency Medicine

## 2019-12-12 ENCOUNTER — Other Ambulatory Visit: Payer: Self-pay | Admitting: Physician Assistant

## 2019-12-12 ENCOUNTER — Encounter: Payer: Self-pay | Admitting: Emergency Medicine

## 2019-12-12 DIAGNOSIS — E785 Hyperlipidemia, unspecified: Secondary | ICD-10-CM

## 2019-12-12 MED ORDER — ATORVASTATIN CALCIUM 20 MG PO TABS: 20.0000 mg | ORAL_TABLET | Freq: Every day | ORAL | 0 refills | Status: DC

## 2019-12-12 MED FILL — ATORVASTATIN 20 MG TABLET: 20 | 90 days supply | Qty: 90 | Fill #0

## 2019-12-12 MED FILL — traZODone HCL 50 MG TABS: 50 | 90 days supply | Qty: 90 | Fill #1

## 2019-12-14 ENCOUNTER — Other Ambulatory Visit: Payer: Self-pay | Admitting: Physician Assistant

## 2019-12-14 DIAGNOSIS — E785 Hyperlipidemia, unspecified: Secondary | ICD-10-CM

## 2019-12-17 ENCOUNTER — Encounter: Payer: Self-pay | Admitting: Physician Assistant

## 2019-12-17 ENCOUNTER — Other Ambulatory Visit: Payer: Self-pay

## 2019-12-17 ENCOUNTER — Ambulatory Visit (INDEPENDENT_AMBULATORY_CARE_PROVIDER_SITE_OTHER): Payer: No Typology Code available for payment source | Admitting: Physician Assistant

## 2019-12-17 DIAGNOSIS — F419 Anxiety disorder, unspecified: Secondary | ICD-10-CM

## 2019-12-17 DIAGNOSIS — G4700 Insomnia, unspecified: Secondary | ICD-10-CM

## 2019-12-17 DIAGNOSIS — E785 Hyperlipidemia, unspecified: Secondary | ICD-10-CM

## 2019-12-17 DIAGNOSIS — F32A Depression, unspecified: Secondary | ICD-10-CM

## 2019-12-17 DIAGNOSIS — F329 Major depressive disorder, single episode, unspecified: Secondary | ICD-10-CM

## 2019-12-17 NOTE — Patient Instructions (Addendum)
Instructions sent to MyChart.  Please keep up with diet and exercise. Remember your goal is for 150 minutes of exercise per week. Keep working on this portion sizes! Come in as scheduled for your fasting labs. I will call you with results and we will make further changes at that time.  Increase the trazodone to 1 tablet (50 mg) nightly. Continue same dose of sertraline daily. Please follow-up with me over the next week to let me know how things are going. This way we can make further changes if needed.  Hang in there!

## 2019-12-17 NOTE — Progress Notes (Signed)
Virtual Visit via Video   I connected with patient on 12/17/19 at 10:00 AM EST by a video enabled telemedicine application and verified that I am speaking with the correct person using two identifiers.  Location patient: Home Location provider: [facility] Persons participating in the virtual visit: Patient, Provider, [place] (Patina Moore)  I discussed the limitations of evaluation and management by telemedicine and the availability of in person appointments. The patient expressed understanding and agreed to proceed.  Subjective:   HPI:   Patient presents via doxy.need today to follow-up regarding cholesterol.  Patient is currently on a regimen of atorvastatin and fenofibrate daily.  Endorses taking medication daily as directed.  Has been trying to work on diet, cutting down his portions sizes significantly.  Has recently started working out a couple times a week with his wife, mainly cardio.   Patient also with history of anxiety.  Notes over the past several weeks this seems to have increased.  Denies any recent stressors or changes to his routine, even through the Covid pandemic.  Patient notes he is more irritable.  Wife is noted this as well.  Has had to increase use of Xanax as he will have episodes of more acute anxiety/panic.  Denies depressed mood or anhedonia.  Denies suicidal thought or ideation.  Denies any significant fluctuations in weight.  Denies racing heart, jitteriness, chest pain or heat intolerance.  Denies diarrhea.  ROS:   See pertinent positives and negatives per HPI.  Patient Active Problem List   Diagnosis Date Noted  . Visit for preventive health examination 01/03/2018  . Physical deconditioning 11/21/2016  . Chronic pain of right inguinal region 10/08/2014  . Hyperlipidemia 05/23/2014  . Insomnia 11/15/2013  . Encounter for preventive health examination 08/08/2013  . Anxiety and depression 08/08/2013  . GERD (gastroesophageal reflux  disease) 08/08/2013  . Needs smoking cessation education 08/08/2013    Social History   Tobacco Use  . Smoking status: Former Smoker    Packs/day: 0.25    Years: 5.00    Pack years: 1.25    Quit date: 12/26/2015    Years since quitting: 3.9  . Smokeless tobacco: Never Used  Substance Use Topics  . Alcohol use: Yes    Alcohol/week: 6.0 standard drinks    Types: 6 Cans of beer per week    Comment: occasional    Current Outpatient Medications:  .  ALPRAZolam (XANAX) 0.5 MG tablet, Take up to twice daily as needed for breakthrough anxiety, Disp: 60 tablet, Rfl: 0 .  atorvastatin (LIPITOR) 20 MG tablet, Take 1 tablet (20 mg total) by mouth daily. Please call the office to schedule an appointment to check cholesterol levels, Disp: 90 tablet, Rfl: 0 .  fenofibrate 160 MG tablet, Take 160 mg by mouth daily., Disp: , Rfl:  .  sertraline (ZOLOFT) 100 MG tablet, Take 1 tablet (100 mg total) by mouth daily., Disp: 90 tablet, Rfl: 1 .  esomeprazole (NEXIUM) 20 MG capsule, Take 1 capsule (20 mg total) by mouth daily at 12 noon., Disp: 90 capsule, Rfl: 1 .  Icosapent Ethyl (VASCEPA) 1 g CAPS, Take 2 capsules (2 g total) by mouth 2 (two) times daily. (Patient not taking: Reported on 12/17/2019), Disp: 120 capsule, Rfl: 1 .  traZODone (DESYREL) 50 MG tablet, Take 1 tablet (50 mg total) by mouth at bedtime., Disp: 90 tablet, Rfl: 1  No Known Allergies  Objective:   There were no vitals taken for this visit.  Patient  is well-developed, well-nourished in no acute distress.  Resting comfortably at home.  Head is normocephalic, atraumatic.  No labored breathing.  Speech is clear and coherent with logical content.  Patient is alert and oriented at baseline.   Assessment and Plan:   1. Hyperlipidemia, unspecified hyperlipidemia type Taking medications as directed.  Has started working on diet and exercise.  Further recommendations given.  Lab appointment scheduled for him to get updated blood work.   We will make further changes based on results.  2. Insomnia, unspecified type Some deterioration along with deterioration and anxiety.  Will increase trazodone to 50 mg nightly.  Close follow-up discussed.  3. Anxiety and depression For now, we will have him continue current dose of sertraline.  Will increase trazodone to 50 mg nightly as hopefully this will help with both anxiety and sleep.  Close follow-up scheduled.    Leeanne Rio, [place] 12/17/2019

## 2019-12-17 NOTE — Progress Notes (Signed)
I have discussed the procedure for the virtual visit with the patient who has given consent to proceed with assessment and treatment.   Mazi Schuff S Cedarius Kersh, CMA     

## 2019-12-18 ENCOUNTER — Other Ambulatory Visit: Payer: Self-pay | Admitting: Emergency Medicine

## 2019-12-18 DIAGNOSIS — E785 Hyperlipidemia, unspecified: Secondary | ICD-10-CM

## 2019-12-18 MED ORDER — FENOFIBRATE 160 MG PO TABS: 160.0000 mg | ORAL_TABLET | Freq: Every day | ORAL | 1 refills | Status: DC

## 2019-12-18 MED FILL — FENOFIBRATE 160 MG TABLET: 160 | 90 days supply | Qty: 90 | Fill #0

## 2019-12-20 ENCOUNTER — Other Ambulatory Visit: Payer: Self-pay

## 2019-12-20 ENCOUNTER — Ambulatory Visit (INDEPENDENT_AMBULATORY_CARE_PROVIDER_SITE_OTHER): Payer: No Typology Code available for payment source | Admitting: *Deleted

## 2019-12-20 DIAGNOSIS — E785 Hyperlipidemia, unspecified: Secondary | ICD-10-CM

## 2019-12-20 DIAGNOSIS — F32A Depression, unspecified: Secondary | ICD-10-CM

## 2019-12-20 DIAGNOSIS — F329 Major depressive disorder, single episode, unspecified: Secondary | ICD-10-CM

## 2019-12-20 DIAGNOSIS — F419 Anxiety disorder, unspecified: Secondary | ICD-10-CM | POA: Diagnosis not present

## 2019-12-20 LAB — COMPREHENSIVE METABOLIC PANEL
ALT: 26 U/L (ref 0–53)
AST: 16 U/L (ref 0–37)
Albumin: 4.3 g/dL (ref 3.5–5.2)
Alkaline Phosphatase: 54 U/L (ref 39–117)
BUN: 17 mg/dL (ref 6–23)
CO2: 29 mEq/L (ref 19–32)
Calcium: 9.5 mg/dL (ref 8.4–10.5)
Chloride: 104 mEq/L (ref 96–112)
Creatinine, Ser: 1.12 mg/dL (ref 0.40–1.50)
GFR: 72.67 mL/min (ref >60.00)
Glucose, Bld: 112 mg/dL — ABNORMAL HIGH (ref 70–99)
Potassium: 4.2 mEq/L (ref 3.5–5.1)
Sodium: 140 mEq/L (ref 135–145)
Total Bilirubin: 0.4 mg/dL (ref 0.2–1.2)
Total Protein: 6.9 g/dL (ref 6.0–8.3)

## 2019-12-20 LAB — LIPID PANEL
Cholesterol: 151 mg/dL (ref 0–200)
HDL: 35.6 mg/dL — ABNORMAL LOW (ref >39.00)
LDL Cholesterol: 83 mg/dL (ref 0–99)
NonHDL: 115.45
Total CHOL/HDL Ratio: 4
Triglycerides: 164 mg/dL — ABNORMAL HIGH (ref 0.0–149.0)
VLDL: 32.8 mg/dL (ref 0.0–40.0)

## 2019-12-20 LAB — TSH: TSH: 1.2 u[IU]/mL (ref 0.35–4.50)

## 2019-12-21 ENCOUNTER — Other Ambulatory Visit (INDEPENDENT_AMBULATORY_CARE_PROVIDER_SITE_OTHER): Payer: No Typology Code available for payment source

## 2019-12-21 DIAGNOSIS — R7309 Other abnormal glucose: Secondary | ICD-10-CM

## 2019-12-21 LAB — HEMOGLOBIN A1C: Hgb A1c MFr Bld: 5.6 % (ref 4.6–6.5)

## 2020-02-02 ENCOUNTER — Other Ambulatory Visit: Payer: Self-pay | Admitting: Physician Assistant

## 2020-02-04 MED FILL — SERTRALINE HCL 100 MG TAB: 100 | 90 days supply | Qty: 90 | Fill #0

## 2020-03-10 ENCOUNTER — Telehealth: Payer: No Typology Code available for payment source | Admitting: Nurse Practitioner

## 2020-03-10 DIAGNOSIS — R059 Cough, unspecified: Secondary | ICD-10-CM

## 2020-03-10 DIAGNOSIS — R05 Cough: Secondary | ICD-10-CM | POA: Diagnosis not present

## 2020-03-10 MED ORDER — BENZONATATE 100 MG PO CAPS: 100.0000 mg | ORAL_CAPSULE | Freq: Three times a day (TID) | ORAL | 0 refills | Status: DC | PRN

## 2020-03-10 MED ORDER — AZITHROMYCIN 250 MG PO TABS: ORAL_TABLET | ORAL | 0 refills | Status: DC

## 2020-03-10 MED FILL — AZITHROMYCIN 250 MG TABLET: 250 | 5 days supply | Qty: 6 | Fill #0

## 2020-03-10 MED FILL — BENZONATATE 100 MG CAPS: 100 | 7 days supply | Qty: 20 | Fill #0

## 2020-03-10 NOTE — Progress Notes (Signed)
We are sorry that you are not feeling well.  Here is how we plan to help!  Based on your presentation I believe you most likely have A cough due to bacteria.  When patients have a fever and a productive cough with a change in color or increased sputum production, we are concerned about bacterial bronchitis.  If left untreated it can progress to pneumonia.  If your symptoms do not improve with your treatment plan it is important that you contact your provider.   I have prescribed Azithromyin 250 mg: two tablets now and then one tablet daily for 4 additonal days    In addition you may use A prescription cough medication called Tessalon Perles 100mg. You may take 1-2 capsules every 8 hours as needed for your cough.   From your responses in the eVisit questionnaire you describe inflammation in the upper respiratory tract which is causing a significant cough.  This is commonly called Bronchitis and has four common causes:    Allergies  Viral Infections  Acid Reflux  Bacterial Infection Allergies, viruses and acid reflux are treated by controlling symptoms or eliminating the cause. An example might be a cough caused by taking certain blood pressure medications. You stop the cough by changing the medication. Another example might be a cough caused by acid reflux. Controlling the reflux helps control the cough.  USE OF BRONCHODILATOR ("RESCUE") INHALERS: There is a risk from using your bronchodilator too frequently.  The risk is that over-reliance on a medication which only relaxes the muscles surrounding the breathing tubes can reduce the effectiveness of medications prescribed to reduce swelling and congestion of the tubes themselves.  Although you feel brief relief from the bronchodilator inhaler, your asthma may actually be worsening with the tubes becoming more swollen and filled with mucus.  This can delay other crucial treatments, such as oral steroid medications. If you need to use a  bronchodilator inhaler daily, several times per day, you should discuss this with your provider.  There are probably better treatments that could be used to keep your asthma under control.     HOME CARE . Only take medications as instructed by your medical team. . Complete the entire course of an antibiotic. . Drink plenty of fluids and get plenty of rest. . Avoid close contacts especially the very young and the elderly . Cover your mouth if you cough or cough into your sleeve. . Always remember to wash your hands . A steam or ultrasonic humidifier can help congestion.   GET HELP RIGHT AWAY IF: . You develop worsening fever. . You become short of breath . You cough up blood. . Your symptoms persist after you have completed your treatment plan MAKE SURE YOU   Understand these instructions.  Will watch your condition.  Will get help right away if you are not doing well or get worse.  Your e-visit answers were reviewed by a board certified advanced clinical practitioner to complete your personal care plan.  Depending on the condition, your plan could have included both over the counter or prescription medications. If there is a problem please reply  once you have received a response from your provider. Your safety is important to us.  If you have drug allergies check your prescription carefully.    You can use MyChart to ask questions about today's visit, request a non-urgent call back, or ask for a work or school excuse for 24 hours related to this e-Visit. If it has been   greater than 24 hours you will need to follow up with your provider, or enter a new e-Visit to address those concerns. You will get an e-mail in the next two days asking about your experience.  I hope that your e-visit has been valuable and will speed your recovery. Thank you for using e-visits.  5-10 minutes spent reviewing and documenting in chart.  

## 2020-03-12 ENCOUNTER — Other Ambulatory Visit: Payer: Self-pay | Admitting: Physician Assistant

## 2020-03-12 DIAGNOSIS — F5101 Primary insomnia: Secondary | ICD-10-CM

## 2020-03-12 MED FILL — traZODone HCL 50 MG TABS: 50 | 90 days supply | Qty: 90 | Fill #0

## 2020-03-12 NOTE — Telephone Encounter (Signed)
Trazodone LFD 06/04/19 #90 with 1 refill LOV 12/17/19 NOV none

## 2020-03-19 ENCOUNTER — Other Ambulatory Visit: Payer: Self-pay

## 2020-03-20 ENCOUNTER — Ambulatory Visit (INDEPENDENT_AMBULATORY_CARE_PROVIDER_SITE_OTHER): Payer: No Typology Code available for payment source | Admitting: Medical

## 2020-03-20 ENCOUNTER — Other Ambulatory Visit: Payer: Self-pay

## 2020-03-20 VITALS — BP 133/77 | HR 63 | Resp 18 | Ht 69.0 in | Wt 206.0 lb

## 2020-03-20 DIAGNOSIS — G4700 Insomnia, unspecified: Secondary | ICD-10-CM

## 2020-03-20 DIAGNOSIS — F32A Depression, unspecified: Secondary | ICD-10-CM

## 2020-03-20 DIAGNOSIS — E781 Pure hyperglyceridemia: Secondary | ICD-10-CM

## 2020-03-20 DIAGNOSIS — R059 Cough, unspecified: Secondary | ICD-10-CM

## 2020-03-20 DIAGNOSIS — F419 Anxiety disorder, unspecified: Secondary | ICD-10-CM

## 2020-03-20 DIAGNOSIS — E785 Hyperlipidemia, unspecified: Secondary | ICD-10-CM

## 2020-03-20 DIAGNOSIS — F329 Major depressive disorder, single episode, unspecified: Secondary | ICD-10-CM

## 2020-03-20 DIAGNOSIS — J301 Allergic rhinitis due to pollen: Secondary | ICD-10-CM

## 2020-03-20 DIAGNOSIS — R05 Cough: Secondary | ICD-10-CM

## 2020-03-20 DIAGNOSIS — K219 Gastro-esophageal reflux disease without esophagitis: Secondary | ICD-10-CM

## 2020-03-20 DIAGNOSIS — R739 Hyperglycemia, unspecified: Secondary | ICD-10-CM

## 2020-03-20 MED ORDER — FLUTICASONE PROPIONATE 50 MCG/ACT NA SUSP: 2.0000 | Freq: Every day | NASAL | 1 refills | Status: DC

## 2020-03-20 MED ORDER — BUSPIRONE HCL 7.5 MG PO TABS: 7.5000 mg | ORAL_TABLET | Freq: Two times a day (BID) | ORAL | 0 refills | Status: DC

## 2020-03-20 MED ORDER — LEVOCETIRIZINE DIHYDROCHLORIDE 5 MG PO TABS: 5.0000 mg | ORAL_TABLET | Freq: Every evening | ORAL | 3 refills | Status: DC

## 2020-03-20 MED ORDER — MONTELUKAST SODIUM 10 MG PO TABS: 10.0000 mg | ORAL_TABLET | Freq: Every day | ORAL | 3 refills | Status: DC

## 2020-03-20 MED FILL — LEVOCETIRIZINE 5 MG TABLET: 5 | 30 days supply | Qty: 30 | Fill #0

## 2020-03-20 MED FILL — MONTELUKAST SOD 10 MG TAB: 10 | 30 days supply | Qty: 30 | Fill #0

## 2020-03-20 MED FILL — BUSPIRONE HCL 7.5 MG TABS: 7.5 | 30 days supply | Qty: 60 | Fill #0

## 2020-03-20 MED FILL — FLUTICASONE PROP 50 MCG SPR: 50 | 30 days supply | Qty: 16 | Fill #0

## 2020-03-20 NOTE — Progress Notes (Signed)
Subjective:    Patient ID: Jerry Lopez, male    DOB: Apr 29, 1981, 39 y.o.   MRN: [id number]  HPI  Pt in for first time with me.   Pt works for [employer], pt admits diet not good. Admits eating fried food.   Pt was seen 10 days ago. Pt seen for cough virtual. Took zpack and benzonatate. Cough is better still present. 1/2 time productive. Cough since beginning. Sneezing, itchy eyes and runny nose but no fever.  Pt does take allegra every morning, mucinex and theraflu. In spring and summer hx of allergies. This year is worse.  Pt has hx of smoking. Stopped march 2017. Smoked for 10 years. About half pack a day.  Pt states he is very stressed and anxious. Work is one of the factors. Was given zoloft for anxiety. He thinks it is not working.  He has some insomnia and recently trazadone increased.  Pt has prescription of xanax but he does not like to take. Rarely takes and only for panic. Pt never use buspar.  Pt has gerd. He states with nexium otc symptoms.  Hx of hyperlipidemia. On fenofibrate and lipitor.         Review of Systems  Constitutional: Negative for chills, fatigue and fever.  HENT: Positive for postnasal drip. Negative for congestion, sinus pressure, sinus pain and tinnitus.   Respiratory: Positive for cough. Negative for chest tightness, shortness of breath and wheezing.   Cardiovascular: Negative for chest pain and palpitations.  Gastrointestinal: Negative for abdominal pain, constipation, nausea and vomiting.  Musculoskeletal: Negative for back pain and joint swelling.  Skin: Negative for rash.  Neurological: Negative for dizziness and headaches.  Hematological: Negative for adenopathy. Does not bruise/bleed easily.  Psychiatric/Behavioral: Positive for sleep disturbance. Negative for behavioral problems, decreased concentration and suicidal ideas. The patient is nervous/anxious.     Past Medical History:  Diagnosis Date  . Anxiety   .  Arthritis   . Chest pain around 12-02-2014  . GERD (gastroesophageal reflux disease)   . Hypercholesteremia      Social History   Socioeconomic History  . Marital status: Married    Spouse name: Not on file  . Number of children: Not on file  . Years of education: Not on file  . Highest education level: Not on file  Occupational History  . Not on file  Tobacco Use  . Smoking status: Former Smoker    Packs/day: 0.25    Years: 5.00    Pack years: 1.25    Quit date: 12/26/2015    Years since quitting: 4.2  . Smokeless tobacco: Never Used  Substance and Sexual Activity  . Alcohol use: Yes    Alcohol/week: 6.0 standard drinks    Types: 6 Cans of beer per week    Comment: occasional  . Drug use: No  . Sexual activity: Yes    Birth control/protection: None  Other Topics Concern  . Not on file  Social History Narrative  . Not on file   Social Determinants of Health   Financial Resource Strain:   . Difficulty of Paying Living Expenses:   Food Insecurity:   . Worried About Charity fundraiser in the Last Year:   . Arboriculturist in the Last Year:   Transportation Needs:   . Film/video editor (Medical):   Marland Kitchen Lack of Transportation (Non-Medical):   Physical Activity:   . Days of Exercise per Week:   . Minutes  of Exercise per Session:   Stress:   . Feeling of Stress :   Social Connections:   . Frequency of Communication with Friends and Family:   . Frequency of Social Gatherings with Friends and Family:   . Attends Religious Services:   . Active Member of Clubs or Organizations:   . Attends Archivist Meetings:   Marland Kitchen Marital Status:   Intimate Partner Violence:   . Fear of Current or Ex-Partner:   . Emotionally Abused:   Marland Kitchen Physically Abused:   . Sexually Abused:     Past Surgical History:  Procedure Laterality Date  . ELBOW FRACTURE SURGERY Right 07.16.14   Radial Repair  . INGUINAL HERNIA REPAIR Right 01/06/2015   Procedure: LAPAROSCOPIC REPAIR OF  RIGHT INGUINAL HERNIA WITH MESH;  Surgeon: Greer Pickerel, MD;  Location: [facility];  Service: General;  Laterality: Right;  . INSERTION OF MESH Right 01/06/2015   Procedure: INSERTION OF MESH;  Surgeon: Greer Pickerel, MD;  Location: [facility];  Service: General;  Laterality: Right;  . LEG SURGERY Right    lower, for fracture age 70  . VASECTOMY    . VASECTOMY REVERSAL      Family History  Problem Relation Age of Onset  . Healthy Mother        Living  . Hypertension Father        Living  . Non-Hodgkin's lymphoma Father   . Diabetes Father   . Hypertension Other        Paternal side  . Heart attack Paternal Grandfather   . Healthy Sister        x1  . Healthy Daughter        x3    No Known Allergies  Current Outpatient Medications on File Prior to Visit  Medication Sig Dispense Refill  . traZODone (DESYREL) 50 MG tablet TAKE 1 TABLET (50 MG TOTAL) BY MOUTH AT BEDTIME. 90 tablet 1  . ALPRAZolam (XANAX) 0.5 MG tablet Take up to twice daily as needed for breakthrough anxiety 60 tablet 0  . atorvastatin (LIPITOR) 20 MG tablet Take 1 tablet (20 mg total) by mouth daily. Please call the office to schedule an appointment to check cholesterol levels 90 tablet 0  . azithromycin (ZITHROMAX Z-PAK) 250 MG tablet As directed (Patient not taking: Reported on 03/20/2020) 6 tablet 0  . benzonatate (TESSALON PERLES) 100 MG capsule Take 1 capsule (100 mg total) by mouth 3 (three) times daily as needed for cough. (Patient not taking: Reported on 03/20/2020) 20 capsule 0  . esomeprazole (NEXIUM) 20 MG capsule Take 1 capsule (20 mg total) by mouth daily at 12 noon. (Patient taking differently: Take 40 mg by mouth daily at 12 noon. ) 90 capsule 1  . fenofibrate 160 MG tablet Take 1 tablet (160 mg total) by mouth daily. 90 tablet 1  . sertraline (ZOLOFT) 100 MG tablet TAKE 1 TABLET BY MOUTH ONCE DAILY 90 tablet 1   No current facility-administered medications on file prior to visit.    BP 133/77 (BP Location:  Left Arm, Patient Position: Sitting, Cuff Size: Large)   Pulse 63   Resp 18   Ht 5\' 9"  (1.753 m)   Wt 206 lb (93.4 kg)   SpO2 99%   BMI 30.42 kg/m       Objective:   Physical Exam  General Mental Status- Alert. General Appearance- Not in acute distress.    Chest and Lung Exam Auscultation: Breath Sounds:-Normal.  Cardiovascular Auscultation:Rythm- Regular.  Murmurs & Other Heart Sounds:Auscultation of the heart reveals- No Murmurs.  Abdomen Inspection:-Inspeection Normal. Palpation/Percussion:Note:No mass. Palpation and Percussion of the abdomen reveal- Non Tender, Non Distended + BS, no rebound or guarding.    Neurologic Cranial Nerve exam:- CN III-XII intact(No nystagmus), symmetric smile. Strength:- 5/5 equal and symmetric strength both upper and lower extremities.   heent- no sinus pressure. Mild deviated septum. Boggy turbinates    Assessment & Plan:  For anxiety and depression, Continue sertraline and trazodone.  Going to add BuSpar to your regimen since anxiety is recently worse.  You do not like to take Xanax frequently so BuSpar daily would be better option.  Start you on low-dose and might need to increase dose.  For history of allergic rhinitis with likely recent flare, I want you to try Xyzal antihistamine, Flonase nasal spray and montelukast if needed.  If you are using above allergy medication regimen and your cough is still persisting then would recommend chest x-ray.  Future x-ray order placed today.  Would recommend waiting about a week or so to see how you respond to medications before getting x-ray.  Foot hyperlipidemia and elevated sugar in the past, placed future labs.  This will include metabolic panel, lipid panel and A1c.  Recommend getting scheduled for those labs to be done late next week.  For GERD, continue with Nexium but also recommend healthy diet.  Follow-up date to be determined after lab review.  Mackie Pai, PA-C   Time spent  with patient today was 30  minutes which consisted of chart review, discussing diagnoses, work up, treatment and documentation.

## 2020-03-20 NOTE — Patient Instructions (Signed)
For anxiety and depression, Continue sertraline and trazodone.  Going to add BuSpar to your regimen since anxiety is recently worse.  You do not like to take Xanax frequently so BuSpar daily would be better option.  Start you on low-dose and might need to increase dose.  For history of allergic rhinitis with likely recent flare, I want you to try Xyzal antihistamine, Flonase nasal spray and montelukast if needed.  If you are using above allergy medication regimen and your cough is still persisting then would recommend chest x-ray.  Future x-ray order placed today.  Would recommend waiting about a week or so to see how you respond to medications before getting x-ray.  Foot hyperlipidemia and elevated sugar in the past, placed future labs.  This will include metabolic panel, lipid panel and A1c.  Recommend getting scheduled for those labs to be done late next week.  For GERD, continue with Nexium but also recommend healthy diet.  Follow-up date to be determined after lab review.

## 2020-03-25 ENCOUNTER — Other Ambulatory Visit: Payer: Self-pay | Admitting: Physician Assistant

## 2020-03-25 DIAGNOSIS — E785 Hyperlipidemia, unspecified: Secondary | ICD-10-CM

## 2020-03-25 MED FILL — FENOFIBRATE 160 MG TABLET: 160 | 90 days supply | Qty: 90 | Fill #1

## 2020-03-26 MED FILL — ATORVASTATIN 20 MG TABLET: 20 | 90 days supply | Qty: 90 | Fill #0

## 2020-03-26 NOTE — Telephone Encounter (Signed)
Rx atorvastatin sent to pt pharmacy. 

## 2020-04-21 ENCOUNTER — Other Ambulatory Visit: Payer: Self-pay | Admitting: Medical

## 2020-04-22 MED ORDER — BUSPIRONE HCL 7.5 MG PO TABS: 7.5000 mg | ORAL_TABLET | Freq: Two times a day (BID) | ORAL | 0 refills | Status: DC

## 2020-04-22 MED FILL — BUSPIRONE HCL 7.5 MG TABS: 7.5 | 30 days supply | Qty: 60 | Fill #0

## 2020-05-18 ENCOUNTER — Encounter: Payer: Self-pay | Admitting: Medical

## 2020-05-20 MED FILL — SERTRALINE HCL 100 MG TABS: 100 | 90 days supply | Qty: 90 | Fill #1

## 2020-05-27 ENCOUNTER — Ambulatory Visit (INDEPENDENT_AMBULATORY_CARE_PROVIDER_SITE_OTHER): Payer: No Typology Code available for payment source | Admitting: Medical

## 2020-05-27 ENCOUNTER — Other Ambulatory Visit: Payer: Self-pay

## 2020-05-27 ENCOUNTER — Other Ambulatory Visit: Payer: Self-pay | Admitting: Medical

## 2020-05-27 VITALS — BP 138/75 | HR 77 | Temp 97.6°F | Ht 69.0 in | Wt 210.6 lb

## 2020-05-27 DIAGNOSIS — K649 Unspecified hemorrhoids: Secondary | ICD-10-CM

## 2020-05-27 DIAGNOSIS — K921 Melena: Secondary | ICD-10-CM | POA: Diagnosis not present

## 2020-05-27 MED ORDER — BUSPIRONE HCL 7.5 MG PO TABS: 7.5000 mg | ORAL_TABLET | Freq: Two times a day (BID) | ORAL | 3 refills | Status: DC

## 2020-05-27 MED ORDER — HYDROCORTISONE ACETATE 25 MG RE SUPP: 25.0000 mg | Freq: Two times a day (BID) | RECTAL | 0 refills | Status: DC

## 2020-05-27 NOTE — Addendum Note (Signed)
Addended by: Anabel Halon on: 05/27/2020 11:31 AM   Modules accepted: Orders

## 2020-05-27 NOTE — Patient Instructions (Addendum)
Intermittent recent bright red blood when you pass stool with rectal itching.  On exam appears to have some nonthrombosed external hemorrhoid.  Concerned that you might have internal hemorrhoid causing bleeding.  Will prescribe Anusol HC suppository and see if your itching and bleeding resolves.  Recommend that you stay hydrated and avoid constipation.  You requested referral to GI and I went ahead and place that referral.  We will see how you respond to the above during the interim.  Follow-up here as needed pending GI referral for you.

## 2020-05-27 NOTE — Progress Notes (Signed)
Subjective:    Patient ID: Jerry Lopez, male    DOB: 04-27-1981, 39 y.o.   MRN: [id number]  HPI  Pt in for some appearance of some blood in stool. States comes and goes. He states one day last week he saw some bright red blood drip into toilet. Pt showed me on toilet paper fair bit of bright red blood with moderate to large sized clot.  Occurs every other day. Pt states a lot of rectal itching. No pain. No bump or bulge in rectal area. Wife states she can see hemorrhoid. Pt does not describe constipation.  Pt saw minimal blood present yesterday.     Review of Systems  Constitutional: Negative for chills and fever.  Respiratory: Negative for cough, chest tightness, shortness of breath and wheezing.   Cardiovascular: Negative for chest pain and palpitations.  Gastrointestinal: Positive for blood in stool. Negative for abdominal distention, abdominal pain, rectal pain and vomiting.       Rectal itche  Musculoskeletal: Negative for back pain.  Skin: Negative for rash.  Neurological: Negative for dizziness and headaches.  Hematological: Negative for adenopathy. Does not bruise/bleed easily.  Psychiatric/Behavioral: Negative for behavioral problems and dysphoric mood. The patient is not nervous/anxious.     Past Medical History:  Diagnosis Date  . Anxiety   . Arthritis   . Chest pain around 12-02-2014  . GERD (gastroesophageal reflux disease)   . Hypercholesteremia      Social History   Socioeconomic History  . Marital status: Married    Spouse name: Not on file  . Number of children: Not on file  . Years of education: Not on file  . Highest education level: Not on file  Occupational History  . Not on file  Tobacco Use  . Smoking status: Former Smoker    Packs/day: 0.25    Years: 5.00    Pack years: 1.25    Quit date: 12/26/2015    Years since quitting: 4.4  . Smokeless tobacco: Never Used  Vaping Use  . Vaping Use: Never used  Substance and Sexual Activity  .  Alcohol use: Yes    Alcohol/week: 6.0 standard drinks    Types: 6 Cans of beer per week    Comment: occasional  . Drug use: No  . Sexual activity: Yes    Birth control/protection: None  Other Topics Concern  . Not on file  Social History Narrative  . Not on file   Social Determinants of Health   Financial Resource Strain:   . Difficulty of Paying Living Expenses:   Food Insecurity:   . Worried About Charity fundraiser in the Last Year:   . Arboriculturist in the Last Year:   Transportation Needs:   . Film/video editor (Medical):   Marland Kitchen Lack of Transportation (Non-Medical):   Physical Activity:   . Days of Exercise per Week:   . Minutes of Exercise per Session:   Stress:   . Feeling of Stress :   Social Connections:   . Frequency of Communication with Friends and Family:   . Frequency of Social Gatherings with Friends and Family:   . Attends Religious Services:   . Active Member of Clubs or Organizations:   . Attends Archivist Meetings:   Marland Kitchen Marital Status:   Intimate Partner Violence:   . Fear of Current or Ex-Partner:   . Emotionally Abused:   Marland Kitchen Physically Abused:   . Sexually Abused:  Past Surgical History:  Procedure Laterality Date  . ELBOW FRACTURE SURGERY Right 07.16.14   Radial Repair  . INGUINAL HERNIA REPAIR Right 01/06/2015   Procedure: LAPAROSCOPIC REPAIR OF RIGHT INGUINAL HERNIA WITH MESH;  Surgeon: Greer Pickerel, MD;  Location: [facility];  Service: General;  Laterality: Right;  . INSERTION OF MESH Right 01/06/2015   Procedure: INSERTION OF MESH;  Surgeon: Greer Pickerel, MD;  Location: [facility];  Service: General;  Laterality: Right;  . LEG SURGERY Right    lower, for fracture age 32  . VASECTOMY    . VASECTOMY REVERSAL      Family History  Problem Relation Age of Onset  . Healthy Mother        Living  . Hypertension Father        Living  . Non-Hodgkin's lymphoma Father   . Diabetes Father   . Hypertension Other        Paternal side  .  Heart attack Paternal Grandfather   . Healthy Sister        x1  . Healthy Daughter        x3    No Known Allergies  Current Outpatient Medications on File Prior to Visit  Medication Sig Dispense Refill  . ALPRAZolam (XANAX) 0.5 MG tablet Take up to twice daily as needed for breakthrough anxiety 60 tablet 0  . atorvastatin (LIPITOR) 20 MG tablet TAKE 1 TABLET (20 MG TOTAL) BY MOUTH DAILY. PLEASE CALL THE OFFICE TO SCHEDULE AN APPOINTMENT TO CHECK CHOLESTEROL LEVELS 90 tablet 0  . azithromycin (ZITHROMAX Z-PAK) 250 MG tablet As directed (Patient not taking: Reported on 03/20/2020) 6 tablet 0  . benzonatate (TESSALON PERLES) 100 MG capsule Take 1 capsule (100 mg total) by mouth 3 (three) times daily as needed for cough. (Patient not taking: Reported on 03/20/2020) 20 capsule 0  . busPIRone (BUSPAR) 7.5 MG tablet Take 1 tablet (7.5 mg total) by mouth 2 (two) times daily. Please send rx through mail order per patient request. 60 tablet 0  . esomeprazole (NEXIUM) 20 MG capsule Take 1 capsule (20 mg total) by mouth daily at 12 noon. (Patient not taking: Reported on 05/27/2020) 90 capsule 1  . fenofibrate 160 MG tablet Take 1 tablet (160 mg total) by mouth daily. 90 tablet 1  . fluticasone (FLONASE) 50 MCG/ACT nasal spray Place 2 sprays into both nostrils daily. 16 g 1  . levocetirizine (XYZAL) 5 MG tablet Take 1 tablet (5 mg total) by mouth every evening. 30 tablet 3  . montelukast (SINGULAIR) 10 MG tablet Take 1 tablet (10 mg total) by mouth at bedtime. 30 tablet 3  . sertraline (ZOLOFT) 100 MG tablet TAKE 1 TABLET BY MOUTH ONCE DAILY 90 tablet 1  . traZODone (DESYREL) 50 MG tablet TAKE 1 TABLET (50 MG TOTAL) BY MOUTH AT BEDTIME. 90 tablet 1   No current facility-administered medications on file prior to visit.    BP 138/75   Pulse 77   Temp 97.6 F (36.4 C) (Oral)   Ht 5\' 9"  (1.753 m)   Wt 210 lb 9.6 oz (95.5 kg)   SpO2 100%   BMI 31.10 kg/m       Objective:   Physical  Exam   General- No acute distress. Pleasant patient. Neck- Full range of motion, no jvd Lungs- Clear, even and unlabored. Heart- regular rate and rhythm. Neurologic- CNII- XII grossly intact.  Rectal- on inspection some hemorrhoid but not inflamed/thrombosed. Did dre and hemocult card was negative.  Assessment & Plan:  Intermittent recent bright red blood when you pass stool with rectal itching.  On exam appears to have some nonthrombosed external hemorrhoid.  Concerned that you might have internal hemorrhoid causing bleeding.  Will prescribe Anusol HC suppository and see if your itching and bleeding resolves.  Recommend that you stay hydrated and avoid constipation.  You requested referral to GI and I went ahead and place that referral.  We will see how you respond to the above during the interim.  Follow-up here as needed pending GI referral for you.  Mackie Pai, PA-C   Time spent with patient today was 20  minutes which consisted of chart review, discussing diagnosis,  treatment , referal, and documentation.

## 2020-05-30 ENCOUNTER — Encounter: Payer: Self-pay | Admitting: Gastroenterology

## 2020-05-30 MED FILL — busPIRone HCL 7.5 MG TABS: 7.5 | 30 days supply | Qty: 60 | Fill #0

## 2020-05-30 MED FILL — HYDROCORTISONE ACETATE 25 M: 25 | 6 days supply | Qty: 12 | Fill #0

## 2020-06-15 ENCOUNTER — Other Ambulatory Visit: Payer: Self-pay | Admitting: Medical

## 2020-06-15 ENCOUNTER — Other Ambulatory Visit: Payer: Self-pay | Admitting: Physician Assistant

## 2020-06-15 DIAGNOSIS — E785 Hyperlipidemia, unspecified: Secondary | ICD-10-CM

## 2020-06-15 MED FILL — traZODone HCL 50 MG TABS: 50 | 90 days supply | Qty: 90 | Fill #1

## 2020-06-16 ENCOUNTER — Other Ambulatory Visit: Payer: Self-pay | Admitting: Medical

## 2020-06-16 ENCOUNTER — Telehealth: Payer: Self-pay | Admitting: Medical

## 2020-06-16 DIAGNOSIS — E785 Hyperlipidemia, unspecified: Secondary | ICD-10-CM

## 2020-06-16 MED ORDER — FENOFIBRATE 160 MG PO TABS: 160.0000 mg | ORAL_TABLET | Freq: Every day | ORAL | 1 refills | Status: DC

## 2020-06-16 MED FILL — ATORVASTATIN CALCIUM 20 MG: 20 | 90 days supply | Qty: 90 | Fill #0

## 2020-06-16 NOTE — Telephone Encounter (Signed)
Rx fenofibrate sent to pt pharmacy.

## 2020-06-16 NOTE — Telephone Encounter (Signed)
Will defer further refills of patient's medications to PCP  

## 2020-06-17 MED FILL — FENOFIBRATE 160 MG TABLET: 160 | 90 days supply | Qty: 90 | Fill #0

## 2020-07-04 MED FILL — busPIRone HCL 7.5 MG TABS: 7.5 | 30 days supply | Qty: 60 | Fill #0

## 2020-07-09 MED FILL — FENOFIBRATE 160 MG TABLET: 160 | 90 days supply | Qty: 90 | Fill #0

## 2020-07-29 ENCOUNTER — Ambulatory Visit: Payer: No Typology Code available for payment source | Admitting: Gastroenterology

## 2020-08-11 MED FILL — busPIRone HCL 7.5 MG TABS: 7.5 | 30 days supply | Qty: 60 | Fill #1

## 2020-09-11 ENCOUNTER — Other Ambulatory Visit: Payer: Self-pay | Admitting: Physician Assistant

## 2020-09-11 ENCOUNTER — Other Ambulatory Visit: Payer: Self-pay | Admitting: Medical

## 2020-09-11 ENCOUNTER — Telehealth: Payer: Self-pay | Admitting: Medical

## 2020-09-11 DIAGNOSIS — F5101 Primary insomnia: Secondary | ICD-10-CM

## 2020-09-11 MED FILL — busPIRone HCL 7.5 MG TABS: 7.5 | 30 days supply | Qty: 60 | Fill #2

## 2020-09-11 MED FILL — traZODone HCL 50 MG TABS: 50 | 90 days supply | Qty: 90 | Fill #0

## 2020-09-11 NOTE — Telephone Encounter (Signed)
Refilled pt trazadone. Ask he follow up in 3 months.

## 2020-09-23 ENCOUNTER — Other Ambulatory Visit: Payer: Self-pay | Admitting: Physician Assistant

## 2020-09-23 ENCOUNTER — Other Ambulatory Visit: Payer: Self-pay | Admitting: Medical

## 2020-09-23 DIAGNOSIS — E785 Hyperlipidemia, unspecified: Secondary | ICD-10-CM

## 2020-09-23 MED FILL — FENOFIBRATE 160 MG TABLET: 160 | 90 days supply | Qty: 90 | Fill #1

## 2020-09-23 MED FILL — ATORVASTATIN CALCIUM 20 MG: 20 | 90 days supply | Qty: 90 | Fill #0

## 2020-09-25 ENCOUNTER — Other Ambulatory Visit: Payer: Self-pay | Admitting: Medical

## 2020-09-25 MED ORDER — SERTRALINE HCL 100 MG PO TABS: 100.0000 mg | ORAL_TABLET | Freq: Every day | ORAL | 1 refills | Status: DC

## 2020-09-25 MED FILL — SERTRALINE HCL 100 MG TABS: 100 | 30 days supply | Qty: 30 | Fill #0

## 2020-09-25 NOTE — Telephone Encounter (Signed)
It has been may 2021 since I saw him in regards to his mood. I sent in 30 tab rx sertraline. So would you get him scheduled some time this month or next month.

## 2020-09-25 NOTE — Telephone Encounter (Signed)
Refill rx sertaline.

## 2020-10-01 ENCOUNTER — Other Ambulatory Visit: Payer: Self-pay

## 2020-10-01 ENCOUNTER — Ambulatory Visit (INDEPENDENT_AMBULATORY_CARE_PROVIDER_SITE_OTHER): Payer: No Typology Code available for payment source | Admitting: Medical

## 2020-10-01 ENCOUNTER — Encounter: Payer: Self-pay | Admitting: Medical

## 2020-10-01 VITALS — BP 139/80 | HR 83 | Temp 98.7°F | Resp 20 | Ht 69.0 in | Wt 210.0 lb

## 2020-10-01 DIAGNOSIS — R739 Hyperglycemia, unspecified: Secondary | ICD-10-CM

## 2020-10-01 DIAGNOSIS — Z0000 Encounter for general adult medical examination without abnormal findings: Secondary | ICD-10-CM | POA: Diagnosis not present

## 2020-10-01 DIAGNOSIS — E785 Hyperlipidemia, unspecified: Secondary | ICD-10-CM | POA: Diagnosis not present

## 2020-10-01 DIAGNOSIS — E669 Obesity, unspecified: Secondary | ICD-10-CM

## 2020-10-01 DIAGNOSIS — F32A Depression, unspecified: Secondary | ICD-10-CM

## 2020-10-01 DIAGNOSIS — F419 Anxiety disorder, unspecified: Secondary | ICD-10-CM

## 2020-10-01 DIAGNOSIS — Z1283 Encounter for screening for malignant neoplasm of skin: Secondary | ICD-10-CM

## 2020-10-01 LAB — CBC WITH DIFFERENTIAL/PLATELET
Basophils Absolute: 0 10*3/uL (ref 0.0–0.1)
Basophils Relative: 0.5 % (ref 0.0–3.0)
Eosinophils Absolute: 0.2 10*3/uL (ref 0.0–0.7)
Eosinophils Relative: 2.5 % (ref 0.0–5.0)
HCT: 45.6 % (ref 39.0–52.0)
Hemoglobin: 15.5 g/dL (ref 13.0–17.0)
Lymphocytes Relative: 32.3 % (ref 12.0–46.0)
Lymphs Abs: 2 10*3/uL (ref 0.7–4.0)
MCHC: 34 g/dL (ref 30.0–36.0)
MCV: 86 fl (ref 78.0–100.0)
Monocytes Absolute: 0.4 10*3/uL (ref 0.1–1.0)
Monocytes Relative: 6.9 % (ref 3.0–12.0)
Neutro Abs: 3.5 10*3/uL (ref 1.4–7.7)
Neutrophils Relative %: 57.8 % (ref 43.0–77.0)
Platelets: 188 10*3/uL (ref 150.0–400.0)
RBC: 5.3 Mil/uL (ref 4.22–5.81)
RDW: 12.9 % (ref 11.5–15.5)
WBC: 6.1 10*3/uL (ref 4.0–10.5)

## 2020-10-01 LAB — COMPREHENSIVE METABOLIC PANEL
ALT: 19 U/L (ref 0–53)
AST: 15 U/L (ref 0–37)
Albumin: 4.8 g/dL (ref 3.5–5.2)
Alkaline Phosphatase: 53 U/L (ref 39–117)
BUN: 22 mg/dL (ref 6–23)
CO2: 32 mEq/L (ref 19–32)
Calcium: 10 mg/dL (ref 8.4–10.5)
Chloride: 101 mEq/L (ref 96–112)
Creatinine, Ser: 1.34 mg/dL (ref 0.40–1.50)
GFR: 66.21 mL/min (ref >60.00)
Glucose, Bld: 92 mg/dL (ref 70–99)
Potassium: 4.7 mEq/L (ref 3.5–5.1)
Sodium: 138 mEq/L (ref 135–145)
Total Bilirubin: 0.8 mg/dL (ref 0.2–1.2)
Total Protein: 7.5 g/dL (ref 6.0–8.3)

## 2020-10-01 LAB — LIPID PANEL
Cholesterol: 173 mg/dL (ref 0–200)
HDL: 38.8 mg/dL — ABNORMAL LOW (ref >39.00)
LDL Cholesterol: 103 mg/dL — ABNORMAL HIGH (ref 0–99)
NonHDL: 134
Total CHOL/HDL Ratio: 4
Triglycerides: 153 mg/dL — ABNORMAL HIGH (ref 0.0–149.0)
VLDL: 30.6 mg/dL (ref 0.0–40.0)

## 2020-10-01 LAB — HEMOGLOBIN A1C: Hgb A1c MFr Bld: 5.7 % (ref 4.6–6.5)

## 2020-10-01 NOTE — Patient Instructions (Addendum)
For you wellness exam today I have ordered cbc, cmp and lipid panel.  Decline flu vaccine and covid vaccine.  Recommend exercise and healthy diet.  We will let you know lab results as they come in.  Follow up date appointment will be determined after lab review.   Referral to derm placed for screening.  Elevated bp. Low salt diet.  Elevated sugar in past. Get a1c and eat low sugar diet.  For borderline obesity bmi above 30 recommend [employer] app. Counseled on weight loss benefits. May drop bp if loose 5-10 lb.  For anxiety can try increasing buspar to 15 mg twice daily. Let me know how you do with that.  Follow up date to be determined after lab review.   Preventive Care 57-37 Years Old, Male Preventive care refers to lifestyle choices and visits with your health care provider that can promote health and wellness. This includes:  A yearly physical exam. This is also called an annual well check.  Regular dental and eye exams.  Immunizations.  Screening for certain conditions.  Healthy lifestyle choices, such as eating a healthy diet, getting regular exercise, not using drugs or products that contain nicotine and tobacco, and limiting alcohol use. What can I expect for my preventive care visit? Physical exam Your health care provider will check:  Height and weight. These may be used to calculate body mass index (BMI), which is a measurement that tells if you are at a healthy weight.  Heart rate and blood pressure.  Your skin for abnormal spots. Counseling Your health care provider may ask you questions about:  Alcohol, tobacco, and drug use.  Emotional well-being.  Home and relationship well-being.  Sexual activity.  Eating habits.  Work and work Statistician. What immunizations do I need?  Influenza (flu) vaccine  This is recommended every year. Tetanus, diphtheria, and pertussis (Tdap) vaccine  You may need a Td booster every 10 years. Varicella (chickenpox)  vaccine  You may need this vaccine if you have not already been vaccinated. Zoster (shingles) vaccine  You may need this after age 38. Measles, mumps, and rubella (MMR) vaccine  You may need at least one dose of MMR if you were born in 1957 or later. You may also need a second dose. Pneumococcal conjugate (PCV13) vaccine  You may need this if you have certain conditions and were not previously vaccinated. Pneumococcal polysaccharide (PPSV23) vaccine  You may need one or two doses if you smoke cigarettes or if you have certain conditions. Meningococcal conjugate (MenACWY) vaccine  You may need this if you have certain conditions. Hepatitis A vaccine  You may need this if you have certain conditions or if you travel or work in places where you may be exposed to hepatitis A. Hepatitis B vaccine  You may need this if you have certain conditions or if you travel or work in places where you may be exposed to hepatitis B. Haemophilus influenzae type b (Hib) vaccine  You may need this if you have certain risk factors. Human papillomavirus (HPV) vaccine  If recommended by your health care provider, you may need three doses over 6 months. You may receive vaccines as individual doses or as more than one vaccine together in one shot (combination vaccines). Talk with your health care provider about the risks and benefits of combination vaccines. What tests do I need? Blood tests  Lipid and cholesterol levels. These may be checked every 5 years, or more frequently if you are over 50  years old.  Hepatitis C test.  Hepatitis B test. Screening  Lung cancer screening. You may have this screening every year starting at age 44 if you have a 30-pack-year history of smoking and currently smoke or have quit within the past 15 years.  Prostate cancer screening. Recommendations will vary depending on your family history and other risks.  Colorectal cancer screening. All adults should have this  screening starting at age 38 and continuing until age 65. Your health care provider may recommend screening at age 42 if you are at increased risk. You will have tests every 1-10 years, depending on your results and the type of screening test.  Diabetes screening. This is done by checking your blood sugar (glucose) after you have not eaten for a while (fasting). You may have this done every 1-3 years.  Sexually transmitted disease (STD) testing. Follow these instructions at home: Eating and drinking  Eat a diet that includes fresh fruits and vegetables, whole grains, lean protein, and low-fat dairy products.  Take vitamin and mineral supplements as recommended by your health care provider.  Do not drink alcohol if your health care provider tells you not to drink.  If you drink alcohol: ? Limit how much you have to 0-2 drinks a day. ? Be aware of how much alcohol is in your drink. In the [place] one drink equals one 12 oz bottle of beer (355 mL), one 5 oz glass of wine (148 mL), or one 1 oz glass of hard liquor (44 mL). Lifestyle  Take daily care of your teeth and gums.  Stay active. Exercise for at least 30 minutes on 5 or more days each week.  Do not use any products that contain nicotine or tobacco, such as cigarettes, e-cigarettes, and chewing tobacco. If you need help quitting, ask your health care provider.  If you are sexually active, practice safe sex. Use a condom or other form of protection to prevent STIs (sexually transmitted infections).  Talk with your health care provider about taking a low-dose aspirin every day starting at age 37. What's next?  Go to your health care provider once a year for a well check visit.  Ask your health care provider how often you should have your eyes and teeth checked.  Stay up to date on all vaccines. This information is not intended to replace advice given to you by your health care provider. Make sure you discuss any questions you have  with your health care provider. Document Revised: 10/05/2018 Document Reviewed: 10/05/2018 Elsevier Patient Education  2020 [employer].

## 2020-10-01 NOTE — Progress Notes (Signed)
Subjective:    Patient ID: Jerry Lopez, male    DOB: [birth year], 39 y.o.   MRN: [id number]  HPI  Pt in for cpe/wellness exam.  He is fasting. Pt has not been exercising. Pt plans to do cardio and weight lifting. He wants to lose weight. Drinks about 4 16 oz bottles of mountain dew a day. Occasional beer.    Pt bmi is 31. Interested in dieting and exercise.  Bp is high today. He is not checking at home.  Hx of high cholesterol.   Pt has anxiety and depression. Depression is controlled. Still having some anxiety but better.  Desires weight loss. Wants to get in shape.    Review of Systems  Constitutional: Negative for chills, fatigue and fever.  HENT: Negative for congestion and ear pain.   Respiratory: Negative for cough, chest tightness, shortness of breath and wheezing.   Cardiovascular: Negative for chest pain and palpitations.  Gastrointestinal: Negative for abdominal pain, constipation and nausea.  Genitourinary: Negative for dysuria.  Musculoskeletal: Negative for back pain, myalgias and neck pain.  Skin: Negative for rash.  Neurological: Negative for dizziness, syncope, weakness, numbness and headaches.  Hematological: Negative for adenopathy. Does not bruise/bleed easily.  Psychiatric/Behavioral: Negative for behavioral problems and confusion. The patient is not nervous/anxious.    Past Medical History:  Diagnosis Date  . Anxiety   . Arthritis   . Chest pain around 12-02-2014  . GERD (gastroesophageal reflux disease)   . Hypercholesteremia      Social History   Socioeconomic History  . Marital status: Married    Spouse name: Not on file  . Number of children: Not on file  . Years of education: Not on file  . Highest education level: Not on file  Occupational History  . Not on file  Tobacco Use  . Smoking status: Former Smoker    Packs/day: 0.25    Years: 5.00    Pack years: 1.25    Quit date: 12/26/2015    Years since quitting: 4.7  . Smokeless  tobacco: Never Used  Vaping Use  . Vaping Use: Never used  Substance and Sexual Activity  . Alcohol use: Yes    Alcohol/week: 6.0 standard drinks    Types: 6 Cans of beer per week    Comment: occasional  . Drug use: No  . Sexual activity: Yes    Birth control/protection: None  Other Topics Concern  . Not on file  Social History Narrative  . Not on file   Social Determinants of Health   Financial Resource Strain:   . Difficulty of Paying Living Expenses: Not on file  Food Insecurity:   . Worried About Charity fundraiser in the Last Year: Not on file  . Ran Out of Food in the Last Year: Not on file  Transportation Needs:   . Lack of Transportation (Medical): Not on file  . Lack of Transportation (Non-Medical): Not on file  Physical Activity:   . Days of Exercise per Week: Not on file  . Minutes of Exercise per Session: Not on file  Stress:   . Feeling of Stress : Not on file  Social Connections:   . Frequency of Communication with Friends and Family: Not on file  . Frequency of Social Gatherings with Friends and Family: Not on file  . Attends Religious Services: Not on file  . Active Member of Clubs or Organizations: Not on file  . Attends Archivist Meetings: Not  on file  . Marital Status: Not on file  Intimate Partner Violence:   . Fear of Current or Ex-Partner: Not on file  . Emotionally Abused: Not on file  . Physically Abused: Not on file  . Sexually Abused: Not on file    Past Surgical History:  Procedure Laterality Date  . ELBOW FRACTURE SURGERY Right 07.16.14   Radial Repair  . INGUINAL HERNIA REPAIR Right 01/06/2015   Procedure: LAPAROSCOPIC REPAIR OF RIGHT INGUINAL HERNIA WITH MESH;  Surgeon: Greer Pickerel, MD;  Location: [facility];  Service: General;  Laterality: Right;  . INSERTION OF MESH Right 01/06/2015   Procedure: INSERTION OF MESH;  Surgeon: Greer Pickerel, MD;  Location: [facility];  Service: General;  Laterality: Right;  . LEG SURGERY Right     lower, for fracture age 49  . VASECTOMY    . VASECTOMY REVERSAL      Family History  Problem Relation Age of Onset  . Healthy Mother        Living  . Hypertension Father        Living  . Non-Hodgkin's lymphoma Father   . Diabetes Father   . Hypertension Other        Paternal side  . Heart attack Paternal Grandfather   . Healthy Sister        x1  . Healthy Daughter        x3    No Known Allergies  Current Outpatient Medications on File Prior to Visit  Medication Sig Dispense Refill  . atorvastatin (LIPITOR) 20 MG tablet TAKE 1 TABLET BY MOUTH ONCE DAILY. PLEASE CALL THE OFFICE TO SCHEDULE AN APPOINTMENT TO CHECK CHOLESTEROL LEVELS 90 tablet 0  . azithromycin (ZITHROMAX Z-PAK) 250 MG tablet As directed (Patient not taking: Reported on 03/20/2020) 6 tablet 0  . benzonatate (TESSALON PERLES) 100 MG capsule Take 1 capsule (100 mg total) by mouth 3 (three) times daily as needed for cough. (Patient not taking: Reported on 03/20/2020) 20 capsule 0  . busPIRone (BUSPAR) 7.5 MG tablet Take 1 tablet (7.5 mg total) by mouth 2 (two) times daily. Please send rx through mail order per patient request. 60 tablet 3  . esomeprazole (NEXIUM) 20 MG capsule Take 1 capsule (20 mg total) by mouth daily at 12 noon. 90 capsule 1  . fenofibrate 160 MG tablet TAKE 1 TABLET BY MOUTH ONCE DAILY 90 tablet 1  . fenofibrate 160 MG tablet Take 1 tablet (160 mg total) by mouth daily. 90 tablet 1  . fluticasone (FLONASE) 50 MCG/ACT nasal spray Place 2 sprays into both nostrils daily. 16 g 1  . hydrocortisone (ANUSOL-HC) 25 MG suppository Place 1 suppository (25 mg total) rectally 2 (two) times daily. (Patient not taking: Reported on 10/01/2020) 12 suppository 0  . levocetirizine (XYZAL) 5 MG tablet Take 1 tablet (5 mg total) by mouth every evening. (Patient not taking: Reported on 10/01/2020) 30 tablet 3  . montelukast (SINGULAIR) 10 MG tablet Take 1 tablet (10 mg total) by mouth at bedtime. (Patient not taking:  Reported on 10/01/2020) 30 tablet 3  . sertraline (ZOLOFT) 100 MG tablet Take 1 tablet (100 mg total) by mouth daily. 30 tablet 1  . traZODone (DESYREL) 50 MG tablet TAKE 1 TABLET BY MOUTH AT BEDTIME. 90 tablet 0   No current facility-administered medications on file prior to visit.    BP (!) 146/79   Pulse 83   Temp 98.7 F (37.1 C) (Oral)   Resp 20   Ht  5\' 9"  (1.753 m)   Wt 210 lb (95.3 kg)   SpO2 98%   BMI 31.01 kg/m      Objective:   Physical Exam  General Mental Status- Alert. General Appearance- Not in acute distress.   Skin Scattered, small freckles. On mole that is growing per pt.  Neck Carotid Arteries- Normal color. Moisture- Normal Moisture. No carotid bruits. No JVD.  Chest and Lung Exam Auscultation: Breath Sounds:-Normal.  Cardiovascular Auscultation:Rythm- Regular. Murmurs & Other Heart Sounds:Auscultation of the heart reveals- No Murmurs.  Abdomen Inspection:-Inspeection Normal. Palpation/Percussion:Note:No mass. Palpation and Percussion of the abdomen reveal- Non Tender, Non Distended + BS, no rebound or guarding.   Neurologic Cranial Nerve exam:- CN III-XII intact(No nystagmus), symmetric smile. Strength:- 5/5 equal and symmetric strength both upper and lower extremities.      Assessment & Plan:  For you wellness exam today I have ordered cbc, cmp and lipid panel.  Decline flu vaccine and covid vaccine.  Recommend exercise and healthy diet.  We will let you know lab results as they come in.  Follow up date appointment will be determined after lab review.   Referral to derm placed for screening.  Elevated bp. Low salt diet.  Elevated sugar in past. Get a1c and eat low sugar diet.  For borderline obesity bmi above 30 recommend [employer] app. Counseled on weight loss benefits. May drop bp if loose 5-10 lb.  For anxiety can try increasing buspar to 15 mg twice daily. Let me know how you do with that.  Follow up date to be determined after  lab review.  Mackie Pai, [place]    [id number] charge as did address, elevated bp, elevated sugar, discussed weight loss/obesity, and anxiety.

## 2020-10-20 ENCOUNTER — Other Ambulatory Visit: Payer: Self-pay | Admitting: Medical

## 2020-10-20 MED FILL — busPIRone HCL 7.5 MG TABS: 7.5 | 30 days supply | Qty: 60 | Fill #0

## 2020-10-21 ENCOUNTER — Telehealth: Payer: No Typology Code available for payment source | Admitting: Family Medicine

## 2020-10-21 ENCOUNTER — Encounter: Payer: Self-pay | Admitting: Medical

## 2020-10-31 MED FILL — SERTRALINE HCL 100 MG TABS: 100 | 30 days supply | Qty: 30 | Fill #0

## 2020-11-24 MED FILL — busPIRone HCL 7.5 MG TABS: 7.5 | 30 days supply | Qty: 60 | Fill #1

## 2020-12-11 ENCOUNTER — Other Ambulatory Visit: Payer: Self-pay | Admitting: Medical

## 2020-12-11 DIAGNOSIS — F5101 Primary insomnia: Secondary | ICD-10-CM

## 2020-12-11 MED FILL — traZODone HCL 50 MG TABS: 50 | 90 days supply | Qty: 90 | Fill #0

## 2020-12-11 MED FILL — SERTRALINE HCL 100 MG TABS: 100 | 30 days supply | Qty: 30 | Fill #1

## 2020-12-23 ENCOUNTER — Other Ambulatory Visit: Payer: Self-pay | Admitting: Medical

## 2020-12-23 DIAGNOSIS — E785 Hyperlipidemia, unspecified: Secondary | ICD-10-CM

## 2020-12-23 MED FILL — ATORVASTATIN CALCIUM 20 MG: 20 | 30 days supply | Qty: 30 | Fill #0

## 2020-12-23 MED FILL — busPIRone HCL 7.5 MG TABS: 7.5 | 30 days supply | Qty: 60 | Fill #2

## 2021-01-01 MED FILL — FENOFIBRATE 160 MG TABLET: 160 | 90 days supply | Qty: 90 | Fill #0

## 2021-01-15 ENCOUNTER — Other Ambulatory Visit (HOSPITAL_BASED_OUTPATIENT_CLINIC_OR_DEPARTMENT_OTHER): Payer: Self-pay

## 2021-01-20 ENCOUNTER — Other Ambulatory Visit: Payer: Self-pay | Admitting: Medical

## 2021-01-20 MED FILL — SERTRALINE HCL 100 MG TABS: 100 | 30 days supply | Qty: 30 | Fill #0

## 2021-01-29 ENCOUNTER — Other Ambulatory Visit: Payer: Self-pay | Admitting: Medical

## 2021-01-29 ENCOUNTER — Other Ambulatory Visit (HOSPITAL_COMMUNITY): Payer: Self-pay

## 2021-01-29 DIAGNOSIS — E785 Hyperlipidemia, unspecified: Secondary | ICD-10-CM

## 2021-01-29 MED ORDER — BUSPIRONE HCL 7.5 MG PO TABS
ORAL_TABLET | Freq: Two times a day (BID) | ORAL | 2 refills | Status: DC
Start: 2021-01-29 — End: 2021-05-25
  Filled 2021-01-29 – 2021-02-06 (×2): qty 60, 30d supply, fill #0
  Filled 2021-03-17: qty 60, 30d supply, fill #1
  Filled 2021-04-24: qty 60, 30d supply, fill #2

## 2021-01-29 MED ORDER — ATORVASTATIN CALCIUM 20 MG PO TABS
ORAL_TABLET | ORAL | 0 refills | Status: DC
  Filled 2021-01-29 – 2021-02-06 (×2): qty 30, 30d supply, fill #0

## 2021-01-30 ENCOUNTER — Other Ambulatory Visit (HOSPITAL_COMMUNITY): Payer: Self-pay

## 2021-02-04 ENCOUNTER — Other Ambulatory Visit (HOSPITAL_COMMUNITY): Payer: Self-pay

## 2021-02-06 ENCOUNTER — Other Ambulatory Visit (HOSPITAL_COMMUNITY): Payer: Self-pay

## 2021-02-07 ENCOUNTER — Other Ambulatory Visit (HOSPITAL_COMMUNITY): Payer: Self-pay

## 2021-02-25 ENCOUNTER — Other Ambulatory Visit (HOSPITAL_COMMUNITY): Payer: Self-pay

## 2021-02-25 MED FILL — Sertraline HCl Tab 100 MG: ORAL | 30 days supply | Qty: 30 | Fill #0 | Status: AC

## 2021-03-10 ENCOUNTER — Other Ambulatory Visit: Payer: Self-pay | Admitting: Medical

## 2021-03-10 DIAGNOSIS — E785 Hyperlipidemia, unspecified: Secondary | ICD-10-CM

## 2021-03-11 ENCOUNTER — Other Ambulatory Visit (HOSPITAL_COMMUNITY): Payer: Self-pay

## 2021-03-11 MED ORDER — ATORVASTATIN CALCIUM 20 MG PO TABS
ORAL_TABLET | ORAL | 0 refills | Status: DC
  Filled 2021-03-11: qty 30, 30d supply, fill #0

## 2021-03-17 ENCOUNTER — Other Ambulatory Visit (HOSPITAL_COMMUNITY): Payer: Self-pay

## 2021-03-17 MED FILL — Trazodone HCl Tab 50 MG: ORAL | 90 days supply | Qty: 90 | Fill #0 | Status: AC

## 2021-04-06 ENCOUNTER — Other Ambulatory Visit: Payer: Self-pay | Admitting: Medical

## 2021-04-06 ENCOUNTER — Other Ambulatory Visit (HOSPITAL_COMMUNITY): Payer: Self-pay

## 2021-04-06 DIAGNOSIS — E785 Hyperlipidemia, unspecified: Secondary | ICD-10-CM

## 2021-04-06 MED ORDER — FENOFIBRATE 160 MG PO TABS
ORAL_TABLET | Freq: Every day | ORAL | 1 refills | Status: DC
  Filled 2021-04-06: qty 90, 90d supply, fill #0
  Filled 2021-07-24: qty 90, 90d supply, fill #1

## 2021-04-06 MED ORDER — SERTRALINE HCL 100 MG PO TABS
ORAL_TABLET | Freq: Every day | ORAL | 1 refills | Status: DC
  Filled 2021-04-06: qty 30, 30d supply, fill #0
  Filled 2021-05-12: qty 30, 30d supply, fill #1

## 2021-04-10 ENCOUNTER — Other Ambulatory Visit (HOSPITAL_COMMUNITY): Payer: Self-pay

## 2021-04-10 ENCOUNTER — Other Ambulatory Visit: Payer: Self-pay | Admitting: Medical

## 2021-04-10 DIAGNOSIS — E785 Hyperlipidemia, unspecified: Secondary | ICD-10-CM

## 2021-04-10 MED ORDER — ATORVASTATIN CALCIUM 20 MG PO TABS
ORAL_TABLET | ORAL | 0 refills | Status: DC
  Filled 2021-04-10: qty 30, 30d supply, fill #0

## 2021-04-24 ENCOUNTER — Other Ambulatory Visit (HOSPITAL_COMMUNITY): Payer: Self-pay

## 2021-04-30 DIAGNOSIS — M9902 Segmental and somatic dysfunction of thoracic region: Secondary | ICD-10-CM | POA: Diagnosis not present

## 2021-04-30 DIAGNOSIS — M62830 Muscle spasm of back: Secondary | ICD-10-CM | POA: Diagnosis not present

## 2021-04-30 DIAGNOSIS — M9903 Segmental and somatic dysfunction of lumbar region: Secondary | ICD-10-CM | POA: Diagnosis not present

## 2021-04-30 DIAGNOSIS — M9901 Segmental and somatic dysfunction of cervical region: Secondary | ICD-10-CM | POA: Diagnosis not present

## 2021-05-04 DIAGNOSIS — M9901 Segmental and somatic dysfunction of cervical region: Secondary | ICD-10-CM | POA: Diagnosis not present

## 2021-05-04 DIAGNOSIS — M62830 Muscle spasm of back: Secondary | ICD-10-CM | POA: Diagnosis not present

## 2021-05-04 DIAGNOSIS — M9903 Segmental and somatic dysfunction of lumbar region: Secondary | ICD-10-CM | POA: Diagnosis not present

## 2021-05-04 DIAGNOSIS — M9902 Segmental and somatic dysfunction of thoracic region: Secondary | ICD-10-CM | POA: Diagnosis not present

## 2021-05-05 DIAGNOSIS — M9901 Segmental and somatic dysfunction of cervical region: Secondary | ICD-10-CM | POA: Diagnosis not present

## 2021-05-05 DIAGNOSIS — M62830 Muscle spasm of back: Secondary | ICD-10-CM | POA: Diagnosis not present

## 2021-05-05 DIAGNOSIS — M9903 Segmental and somatic dysfunction of lumbar region: Secondary | ICD-10-CM | POA: Diagnosis not present

## 2021-05-05 DIAGNOSIS — M9902 Segmental and somatic dysfunction of thoracic region: Secondary | ICD-10-CM | POA: Diagnosis not present

## 2021-05-06 DIAGNOSIS — M62830 Muscle spasm of back: Secondary | ICD-10-CM | POA: Diagnosis not present

## 2021-05-06 DIAGNOSIS — M9903 Segmental and somatic dysfunction of lumbar region: Secondary | ICD-10-CM | POA: Diagnosis not present

## 2021-05-06 DIAGNOSIS — M9901 Segmental and somatic dysfunction of cervical region: Secondary | ICD-10-CM | POA: Diagnosis not present

## 2021-05-06 DIAGNOSIS — M9902 Segmental and somatic dysfunction of thoracic region: Secondary | ICD-10-CM | POA: Diagnosis not present

## 2021-05-07 DIAGNOSIS — M9902 Segmental and somatic dysfunction of thoracic region: Secondary | ICD-10-CM | POA: Diagnosis not present

## 2021-05-07 DIAGNOSIS — M9903 Segmental and somatic dysfunction of lumbar region: Secondary | ICD-10-CM | POA: Diagnosis not present

## 2021-05-07 DIAGNOSIS — M9901 Segmental and somatic dysfunction of cervical region: Secondary | ICD-10-CM | POA: Diagnosis not present

## 2021-05-07 DIAGNOSIS — M62830 Muscle spasm of back: Secondary | ICD-10-CM | POA: Diagnosis not present

## 2021-05-12 ENCOUNTER — Other Ambulatory Visit: Payer: Self-pay | Admitting: Medical

## 2021-05-12 DIAGNOSIS — E785 Hyperlipidemia, unspecified: Secondary | ICD-10-CM

## 2021-05-13 ENCOUNTER — Other Ambulatory Visit (HOSPITAL_COMMUNITY): Payer: Self-pay

## 2021-05-13 DIAGNOSIS — M62830 Muscle spasm of back: Secondary | ICD-10-CM | POA: Diagnosis not present

## 2021-05-13 DIAGNOSIS — M9902 Segmental and somatic dysfunction of thoracic region: Secondary | ICD-10-CM | POA: Diagnosis not present

## 2021-05-13 DIAGNOSIS — M9901 Segmental and somatic dysfunction of cervical region: Secondary | ICD-10-CM | POA: Diagnosis not present

## 2021-05-13 DIAGNOSIS — M9903 Segmental and somatic dysfunction of lumbar region: Secondary | ICD-10-CM | POA: Diagnosis not present

## 2021-05-13 MED ORDER — ATORVASTATIN CALCIUM 20 MG PO TABS
ORAL_TABLET | ORAL | 0 refills | Status: DC
  Filled 2021-05-13: qty 30, 30d supply, fill #0

## 2021-05-14 ENCOUNTER — Other Ambulatory Visit (HOSPITAL_COMMUNITY): Payer: Self-pay

## 2021-05-25 ENCOUNTER — Other Ambulatory Visit: Payer: Self-pay | Admitting: Medical

## 2021-05-25 ENCOUNTER — Other Ambulatory Visit (HOSPITAL_COMMUNITY): Payer: Self-pay

## 2021-05-25 MED ORDER — BUSPIRONE HCL 7.5 MG PO TABS
ORAL_TABLET | Freq: Two times a day (BID) | ORAL | 2 refills | Status: DC
  Filled 2021-05-25: qty 60, 30d supply, fill #0
  Filled 2021-07-01: qty 60, 30d supply, fill #1
  Filled 2021-08-07: qty 60, 30d supply, fill #2

## 2021-06-18 ENCOUNTER — Other Ambulatory Visit: Payer: Self-pay | Admitting: Medical

## 2021-06-18 ENCOUNTER — Other Ambulatory Visit (HOSPITAL_COMMUNITY): Payer: Self-pay

## 2021-06-18 DIAGNOSIS — F5101 Primary insomnia: Secondary | ICD-10-CM

## 2021-06-18 DIAGNOSIS — E785 Hyperlipidemia, unspecified: Secondary | ICD-10-CM

## 2021-06-18 MED ORDER — SERTRALINE HCL 100 MG PO TABS
ORAL_TABLET | Freq: Every day | ORAL | 0 refills | Status: DC
  Filled 2021-06-18: qty 30, 30d supply, fill #0

## 2021-06-18 MED ORDER — TRAZODONE HCL 50 MG PO TABS
ORAL_TABLET | Freq: Every day | ORAL | 0 refills | Status: DC
  Filled 2021-06-18: qty 30, 30d supply, fill #0

## 2021-06-18 MED ORDER — ATORVASTATIN CALCIUM 20 MG PO TABS
ORAL_TABLET | ORAL | 0 refills | Status: DC
  Filled 2021-06-18: qty 30, 30d supply, fill #0

## 2021-07-01 ENCOUNTER — Other Ambulatory Visit (HOSPITAL_COMMUNITY): Payer: Self-pay

## 2021-07-16 ENCOUNTER — Other Ambulatory Visit (HOSPITAL_COMMUNITY): Payer: Self-pay

## 2021-07-16 ENCOUNTER — Telehealth: Payer: Self-pay | Admitting: Medical

## 2021-07-16 ENCOUNTER — Other Ambulatory Visit: Payer: Self-pay | Admitting: Medical

## 2021-07-16 DIAGNOSIS — E785 Hyperlipidemia, unspecified: Secondary | ICD-10-CM

## 2021-07-16 DIAGNOSIS — F5101 Primary insomnia: Secondary | ICD-10-CM

## 2021-07-16 MED ORDER — ATORVASTATIN CALCIUM 20 MG PO TABS
ORAL_TABLET | ORAL | 0 refills | Status: DC
  Filled 2021-07-16: qty 30, 30d supply, fill #0

## 2021-07-16 NOTE — Telephone Encounter (Signed)
Patient needing refill    atorvastatin (LIPITOR) 20 MG tablet [[id number]]   traZODone (DESYREL) 50 MG tablet [[id number]]    Patient stated he has 4 or so pills left... he has appt on Tuesday with saguier Can we refill for  him>?

## 2021-07-16 NOTE — Telephone Encounter (Signed)
Lipitor sent in for patient , trazodone can be discussed at office visit next week

## 2021-07-21 ENCOUNTER — Other Ambulatory Visit (HOSPITAL_COMMUNITY): Payer: Self-pay

## 2021-07-21 ENCOUNTER — Other Ambulatory Visit: Payer: Self-pay

## 2021-07-21 ENCOUNTER — Ambulatory Visit: Payer: 59 | Admitting: Medical

## 2021-07-21 VITALS — BP 115/80 | HR 91 | Temp 97.9°F | Resp 18 | Ht 69.0 in | Wt 210.6 lb

## 2021-07-21 DIAGNOSIS — R739 Hyperglycemia, unspecified: Secondary | ICD-10-CM

## 2021-07-21 DIAGNOSIS — K219 Gastro-esophageal reflux disease without esophagitis: Secondary | ICD-10-CM | POA: Diagnosis not present

## 2021-07-21 DIAGNOSIS — J301 Allergic rhinitis due to pollen: Secondary | ICD-10-CM | POA: Diagnosis not present

## 2021-07-21 DIAGNOSIS — F419 Anxiety disorder, unspecified: Secondary | ICD-10-CM

## 2021-07-21 DIAGNOSIS — G5603 Carpal tunnel syndrome, bilateral upper limbs: Secondary | ICD-10-CM

## 2021-07-21 DIAGNOSIS — Z0000 Encounter for general adult medical examination without abnormal findings: Secondary | ICD-10-CM | POA: Diagnosis not present

## 2021-07-21 DIAGNOSIS — E785 Hyperlipidemia, unspecified: Secondary | ICD-10-CM | POA: Diagnosis not present

## 2021-07-21 DIAGNOSIS — F32A Depression, unspecified: Secondary | ICD-10-CM | POA: Diagnosis not present

## 2021-07-21 DIAGNOSIS — F5101 Primary insomnia: Secondary | ICD-10-CM

## 2021-07-21 LAB — CBC WITH DIFFERENTIAL/PLATELET
Basophils Absolute: 0 10*3/uL (ref 0.0–0.1)
Basophils Relative: 0.6 % (ref 0.0–3.0)
Eosinophils Absolute: 0.2 10*3/uL (ref 0.0–0.7)
Eosinophils Relative: 5.6 % — ABNORMAL HIGH (ref 0.0–5.0)
HCT: 41.7 % (ref 39.0–52.0)
Hemoglobin: 14.3 g/dL (ref 13.0–17.0)
Lymphocytes Relative: 34 % (ref 12.0–46.0)
Lymphs Abs: 1.5 10*3/uL (ref 0.7–4.0)
MCHC: 34.3 g/dL (ref 30.0–36.0)
MCV: 86 fl (ref 78.0–100.0)
Monocytes Absolute: 0.4 10*3/uL (ref 0.1–1.0)
Monocytes Relative: 8.2 % (ref 3.0–12.0)
Neutro Abs: 2.3 10*3/uL (ref 1.4–7.7)
Neutrophils Relative %: 51.6 % (ref 43.0–77.0)
Platelets: 155 10*3/uL (ref 150.0–400.0)
RBC: 4.84 Mil/uL (ref 4.22–5.81)
RDW: 12.8 % (ref 11.5–15.5)
WBC: 4.5 10*3/uL (ref 4.0–10.5)

## 2021-07-21 LAB — COMPREHENSIVE METABOLIC PANEL
ALT: 20 U/L (ref 0–53)
AST: 15 U/L (ref 0–37)
Albumin: 4.6 g/dL (ref 3.5–5.2)
Alkaline Phosphatase: 47 U/L (ref 39–117)
BUN: 25 mg/dL — ABNORMAL HIGH (ref 6–23)
CO2: 28 mEq/L (ref 19–32)
Calcium: 9.3 mg/dL (ref 8.4–10.5)
Chloride: 103 mEq/L (ref 96–112)
Creatinine, Ser: 1.13 mg/dL (ref 0.40–1.50)
GFR: 80.78 mL/min (ref >60.00)
Glucose, Bld: 100 mg/dL — ABNORMAL HIGH (ref 70–99)
Potassium: 4.1 mEq/L (ref 3.5–5.1)
Sodium: 139 mEq/L (ref 135–145)
Total Bilirubin: 0.8 mg/dL (ref 0.2–1.2)
Total Protein: 7.2 g/dL (ref 6.0–8.3)

## 2021-07-21 LAB — HEMOGLOBIN A1C: Hgb A1c MFr Bld: 5.9 % (ref 4.6–6.5)

## 2021-07-21 LAB — LIPID PANEL
Cholesterol: 160 mg/dL (ref 0–200)
HDL: 27.4 mg/dL — ABNORMAL LOW (ref >39.00)
LDL Cholesterol: 94 mg/dL (ref 0–99)
NonHDL: 132.83
Total CHOL/HDL Ratio: 6
Triglycerides: 196 mg/dL — ABNORMAL HIGH (ref 0.0–149.0)
VLDL: 39.2 mg/dL (ref 0.0–40.0)

## 2021-07-21 MED ORDER — TRAZODONE HCL 50 MG PO TABS
50.0000 mg | ORAL_TABLET | Freq: Every day | ORAL | 11 refills | Status: DC
Start: 2021-07-21 — End: 2022-07-15
  Filled 2021-07-21: qty 30, 30d supply, fill #0
  Filled 2021-08-18 – 2021-08-21 (×2): qty 30, 30d supply, fill #1
  Filled 2021-09-16 (×2): qty 30, 30d supply, fill #2
  Filled 2021-10-15: qty 30, 30d supply, fill #3
  Filled 2021-11-17: qty 30, 30d supply, fill #4
  Filled 2021-12-18: qty 30, 30d supply, fill #5
  Filled 2022-01-13: qty 30, 30d supply, fill #6
  Filled 2022-02-13: qty 30, 30d supply, fill #7
  Filled 2022-03-23: qty 30, 30d supply, fill #8
  Filled 2022-04-18: qty 30, 30d supply, fill #9
  Filled 2022-04-19: qty 30, 30d supply, fill #0
  Filled 2022-05-20: qty 30, 30d supply, fill #1
  Filled 2022-06-15: qty 30, 30d supply, fill #2

## 2021-07-21 MED ORDER — ALPRAZOLAM 0.5 MG PO TABS
0.5000 mg | ORAL_TABLET | Freq: Every day | ORAL | 0 refills | Status: DC | PRN
  Filled 2021-07-21: qty 8, 8d supply, fill #0

## 2021-07-21 MED ORDER — ALPRAZOLAM 0.5 MG PO TABS
0.5000 mg | ORAL_TABLET | Freq: Every day | ORAL | 0 refills | Status: DC
  Filled 2021-07-21: qty 8, 8d supply, fill #0

## 2021-07-21 NOTE — Patient Instructions (Addendum)
Wellness exam and chronic medical problems addressed today.  For you wellness exam today I have ordered cbc, cmp and lipid panel.  Vaccine declined today.  Recommend exercise and healthy diet.  We will let you know lab results as they come in.  Follow up date appointment will be determined after lab review.    For hyperlipidemia we will follow elderly.  Mild elevated sugar in the past.  Dad has diabetes.  Evaluate A1c and determine treatment if necessary if elevated.  Anxiety and depression.  Stable majority time with BuSpar and sertraline.  However some occasional severe anxiety episodes or insomnia.  Uses Xanax sparingly as a backup.  Gets an limited 8 tablet prescription to your pharmacy.  History of GERD controlled with Nexium.  History of allergic rhinitis and presently controlled with Zyrtec and occasional add on Flonase if needed.  Some intermittent carpal tunnel syndrome type symptoms over the past 6 months.  Some positive findings on physical exam.  Could use a low-dose ibuprofen 400 to 600 mg over-the-counter along with wrist cock-up splints.  If symptoms persist could refer to specialist.  Sometimes EMGs are needed.  Preventive Care 24-61 Years Old, Male Preventive care refers to lifestyle choices and visits with your health care provider that can promote health and wellness. This includes: A yearly physical exam. This is also called an annual wellness visit. Regular dental and eye exams. Immunizations. Screening for certain conditions. Healthy lifestyle choices, such as: Eating a healthy diet. Getting regular exercise. Not using drugs or products that contain nicotine and tobacco. Limiting alcohol use. What can I expect for my preventive care visit? Physical exam Your health care provider will check your: Height and weight. These may be used to calculate your BMI (body mass index). BMI is a measurement that tells if you are at a healthy weight. Heart rate and blood  pressure. Body temperature. Skin for abnormal spots. Counseling Your health care provider may ask you questions about your: Past medical problems. Family's medical history. Alcohol, tobacco, and drug use. Emotional well-being. Home life and relationship well-being. Sexual activity. Diet, exercise, and sleep habits. Work and work Statistician. Access to firearms. What immunizations do I need? Vaccines are usually given at various ages, according to a schedule. Your health care provider will recommend vaccines for you based on your age, medical history, and lifestyle or other factors, such as travel or where you work. What tests do I need? Blood tests Lipid and cholesterol levels. These may be checked every 5 years, or more often if you are over 33 years old. Hepatitis C test. Hepatitis B test. Screening Lung cancer screening. You may have this screening every year starting at age 38 if you have a 30-pack-year history of smoking and currently smoke or have quit within the past 15 years. Prostate cancer screening. Recommendations will vary depending on your family history and other risks. Genital exam to check for testicular cancer or hernias. Colorectal cancer screening. All adults should have this screening starting at age 61 and continuing until age 70. Your health care provider may recommend screening at age 87 if you are at increased risk. You will have tests every 1-10 years, depending on your results and the type of screening test. Diabetes screening. This is done by checking your blood sugar (glucose) after you have not eaten for a while (fasting). You may have this done every 1-3 years. STD (sexually transmitted disease) testing, if you are at risk. Follow these instructions at home: Eating  and drinking  Eat a diet that includes fresh fruits and vegetables, whole grains, lean protein, and low-fat dairy products. Take vitamin and mineral supplements as recommended by your  health care provider. Do not drink alcohol if your health care provider tells you not to drink. If you drink alcohol: Limit how much you have to 0-2 drinks a day. Be aware of how much alcohol is in your drink. In the [place] one drink equals one 12 oz bottle of beer (355 mL), one 5 oz glass of wine (148 mL), or one 1 oz glass of hard liquor (44 mL). Lifestyle Take daily care of your teeth and gums. Brush your teeth every morning and night with fluoride toothpaste. Floss one time each day. Stay active. Exercise for at least 30 minutes 5 or more days each week. Do not use any products that contain nicotine or tobacco, such as cigarettes, e-cigarettes, and chewing tobacco. If you need help quitting, ask your health care provider. Do not use drugs. If you are sexually active, practice safe sex. Use a condom or other form of protection to prevent STIs (sexually transmitted infections). If told by your health care provider, take low-dose aspirin daily starting at age 73. Find healthy ways to cope with stress, such as: Meditation, yoga, or listening to music. Journaling. Talking to a trusted person. Spending time with friends and family. Safety Always wear your seat belt while driving or riding in a vehicle. Do not drive: If you have been drinking alcohol. Do not ride with someone who has been drinking. When you are tired or distracted. While texting. Wear a helmet and other protective equipment during sports activities. If you have firearms in your house, make sure you follow all gun safety procedures. What's next? Go to your health care provider once a year for an annual wellness visit. Ask your health care provider how often you should have your eyes and teeth checked. Stay up to date on all vaccines. This information is not intended to replace advice given to you by your health care provider. Make sure you discuss any questions you have with your health care provider. Document Revised:  12/19/2020 Document Reviewed: 10/05/2018 Elsevier Patient Education  2022 [employer].

## 2021-07-21 NOTE — Progress Notes (Signed)
Subjective:    Patient ID: Jerry Lopez, male    DOB: 1981/05/31, 40 y.o.   MRN: [id number]  HPI  Pt in for wellness exam and address anxiety and depression. He states fasting and no wellness exam this year so went ahead and did wellness.  Pt declines flu vaccine. Declines covid vaccine. Had covid last December 2021.    Pt has hx of anxiety and depression. Pt has been using both buspar and sertraline. He had buspar most recently added on last visit. Was already on sertraline. Reports both anxiety and mood well controlled. Pt has hx of xanax use. He uses that very rarely. Would use xanax only one tab every 1-2 months.   Pt has gerd. States nexium otc controls.   Hx of hyperlipidemia. On atorvastatin 20 mg and fenofibrate.   Hx of insmonia. On trazadone. It does help.  Allergic rhinitis hx. On zyrtec daily and flonase as add on if needed. No longer on montelukast.   Occaional wakes with hand tingling 2-3 nights a week. Past 6 months.  Review of Systems  Constitutional:  Negative for chills, fatigue and fever.  HENT:  Negative for congestion, ear pain, facial swelling and hearing loss.   Respiratory:  Negative for cough, chest tightness, shortness of breath and wheezing.   Cardiovascular:  Negative for chest pain and palpitations.  Gastrointestinal:  Negative for abdominal pain.  Genitourinary:  Negative for difficulty urinating, dysuria, enuresis and frequency.  Musculoskeletal:  Negative for back pain.  Skin:  Negative for rash.    Past Medical History:  Diagnosis Date   Anxiety    Arthritis    Chest pain around 12-02-2014   GERD (gastroesophageal reflux disease)    Hypercholesteremia      Social History   Socioeconomic History   Marital status: Married    Spouse name: Not on file   Number of children: Not on file   Years of education: Not on file   Highest education level: Not on file  Occupational History   Not on file  Tobacco Use   Smoking status: Former     Packs/day: 0.25    Years: 5.00    Pack years: 1.25    Types: Cigarettes    Quit date: 12/26/2015    Years since quitting: 5.5   Smokeless tobacco: Never  Vaping Use   Vaping Use: Never used  Substance and Sexual Activity   Alcohol use: Yes    Alcohol/week: 6.0 standard drinks    Types: 6 Cans of beer per week    Comment: occasional   Drug use: No   Sexual activity: Yes    Birth control/protection: None  Other Topics Concern   Not on file  Social History Narrative   Not on file   Social Determinants of Health   Financial Resource Strain: Not on file  Food Insecurity: Not on file  Transportation Needs: Not on file  Physical Activity: Not on file  Stress: Not on file  Social Connections: Not on file  Intimate Partner Violence: Not on file    Past Surgical History:  Procedure Laterality Date   ELBOW FRACTURE SURGERY Right 07.16.14   Radial Repair   INGUINAL HERNIA REPAIR Right 01/06/2015   Procedure: LAPAROSCOPIC REPAIR OF RIGHT INGUINAL HERNIA WITH MESH;  Surgeon: Greer Pickerel, MD;  Location: [facility];  Service: General;  Laterality: Right;   INSERTION OF MESH Right 01/06/2015   Procedure: INSERTION OF MESH;  Surgeon: Greer Pickerel, MD;  Location:  [facility];  Service: General;  Laterality: Right;   LEG SURGERY Right    lower, for fracture age 24   VASECTOMY     VASECTOMY REVERSAL      Family History  Problem Relation Age of Onset   Healthy Mother        Living   Hypertension Father        Living   Non-Hodgkin's lymphoma Father    Diabetes Father    Hypertension Other        Paternal side   Heart attack Paternal Grandfather    Healthy Sister        x1   Healthy Daughter        x3    No Known Allergies  Current Outpatient Medications on File Prior to Visit  Medication Sig Dispense Refill   atorvastatin (LIPITOR) 20 MG tablet TAKE 1 TABLET BY MOUTH ONCE A DAY (MUST MAKE APPT FOR REFILLS) 30 tablet 0   busPIRone (BUSPAR) 7.5 MG tablet TAKE 1 TABLET BY MOUTH 2  TIMES DAILY 60 tablet 2   esomeprazole (NEXIUM) 20 MG capsule Take 1 capsule (20 mg total) by mouth daily at 12 noon. 90 capsule 1   fenofibrate 160 MG tablet TAKE 1 TABLET BY MOUTH ONCE DAILY 90 tablet 1   fenofibrate 160 MG tablet TAKE 1 TABLET BY MOUTH ONCE DAILY 90 tablet 1   fluticasone (FLONASE) 50 MCG/ACT nasal spray Place 2 sprays into both nostrils daily. 16 g 1   sertraline (ZOLOFT) 100 MG tablet TAKE 1 TABLET BY MOUTH ONCE DAILY 30 tablet 0   sertraline (ZOLOFT) 100 MG tablet TAKE 1 TABLET BY MOUTH ONCE A DAY 30 tablet 0   traZODone (DESYREL) 50 MG tablet TAKE 1 TABLET BY MOUTH AT BEDTIME 30 tablet 0   montelukast (SINGULAIR) 10 MG tablet Take 1 tablet (10 mg total) by mouth at bedtime. 30 tablet 3   No current facility-administered medications on file prior to visit.    BP 115/80   Pulse 91   Temp 97.9 F (36.6 C)   Resp 18   Ht 5\' 9"  (1.753 m)   Wt 210 lb 9.6 oz (95.5 kg)   SpO2 97%   BMI 31.10 kg/m       Objective:   Physical Exam   General Mental Status- Alert. General Appearance- Not in acute distress.   Skin General: Color- Normal Color. Moisture- Normal Moisture.  Neck Carotid Arteries- Normal color. Moisture- Normal Moisture. No carotid bruits. No JVD.  Chest and Lung Exam Auscultation: Breath Sounds:-Normal.  Cardiovascular Auscultation:Rythm- Regular. Murmurs & Other Heart Sounds:Auscultation of the heart reveals- No Murmurs.  Upper ext- wrist manipulation produces finger tingling.  Abdomen Inspection:-Inspeection Normal. Palpation/Percussion:Note:No mass. Palpation and Percussion of the abdomen reveal- Non Tender, Non Distended + BS, no rebound or guarding.    Neurologic Cranial Nerve exam:- CN III-XII intact(No nystagmus), symmetric smile. Strength:- 5/5 equal and symmetric strength both upper and lower extremities.      Assessment & Plan:   Patient Instructions  Wellness exam and chronic medical problems addressed today.  For you  wellness exam today I have ordered cbc, cmp and lipid panel.  Vaccine declined today.  Recommend exercise and healthy diet.  We will let you know lab results as they come in.  Follow up date appointment will be determined after lab review.    For hyperlipidemia we will follow elderly.  Mild elevated sugar in the past.  Dad has diabetes.  Evaluate  A1c and determine treatment if necessary if elevated.  Anxiety and depression.  Stable majority time with BuSpar and sertraline.  However some occasional severe anxiety episodes or insomnia.  Uses Xanax sparingly as a backup.  Gets an limited 8 tablet prescription to your pharmacy.  History of GERD controlled with Nexium.  History of allergic rhinitis and presently controlled with Zyrtec and occasional add on Flonase if needed.  Some intermittent carpal tunnel syndrome type symptoms over the past 6 months.  Some positive findings on physical exam.  Could use a low-dose ibuprofen 400 to 600 mg over-the-counter along with wrist cock-up splints.  If symptoms persist could refer to specialist.  Sometimes EMGs are needed.    Mackie Pai, [place]   [id number] charge today as well as addressed chronic problem list and new CTS complaint in additon to the wellness exam.

## 2021-07-24 ENCOUNTER — Other Ambulatory Visit (HOSPITAL_COMMUNITY): Payer: Self-pay

## 2021-07-24 ENCOUNTER — Other Ambulatory Visit: Payer: Self-pay | Admitting: Medical

## 2021-07-24 MED ORDER — SERTRALINE HCL 100 MG PO TABS
ORAL_TABLET | Freq: Every day | ORAL | 0 refills | Status: DC
  Filled 2021-07-24: qty 30, 30d supply, fill #0

## 2021-07-27 ENCOUNTER — Other Ambulatory Visit (HOSPITAL_COMMUNITY): Payer: Self-pay

## 2021-08-07 ENCOUNTER — Other Ambulatory Visit (HOSPITAL_COMMUNITY): Payer: Self-pay

## 2021-08-13 DIAGNOSIS — M25561 Pain in right knee: Secondary | ICD-10-CM | POA: Diagnosis not present

## 2021-08-18 ENCOUNTER — Other Ambulatory Visit (HOSPITAL_COMMUNITY): Payer: Self-pay

## 2021-08-21 ENCOUNTER — Other Ambulatory Visit (HOSPITAL_COMMUNITY): Payer: Self-pay

## 2021-08-23 ENCOUNTER — Other Ambulatory Visit: Payer: Self-pay | Admitting: Medical

## 2021-08-23 DIAGNOSIS — E785 Hyperlipidemia, unspecified: Secondary | ICD-10-CM

## 2021-08-24 ENCOUNTER — Other Ambulatory Visit (HOSPITAL_COMMUNITY): Payer: Self-pay

## 2021-08-24 MED ORDER — ATORVASTATIN CALCIUM 20 MG PO TABS
20.0000 mg | ORAL_TABLET | Freq: Every day | ORAL | 0 refills | Status: DC
  Filled 2021-08-24: qty 90, 90d supply, fill #0

## 2021-08-25 ENCOUNTER — Other Ambulatory Visit (HOSPITAL_COMMUNITY): Payer: Self-pay

## 2021-08-27 DIAGNOSIS — M25561 Pain in right knee: Secondary | ICD-10-CM | POA: Diagnosis not present

## 2021-08-31 ENCOUNTER — Other Ambulatory Visit: Payer: Self-pay | Admitting: Medical

## 2021-09-01 ENCOUNTER — Other Ambulatory Visit (HOSPITAL_COMMUNITY): Payer: Self-pay

## 2021-09-01 MED ORDER — SERTRALINE HCL 100 MG PO TABS
ORAL_TABLET | Freq: Every day | ORAL | 0 refills | Status: DC
  Filled 2021-09-01: qty 30, 30d supply, fill #0

## 2021-09-03 DIAGNOSIS — M25561 Pain in right knee: Secondary | ICD-10-CM | POA: Diagnosis not present

## 2021-09-11 ENCOUNTER — Other Ambulatory Visit (HOSPITAL_COMMUNITY): Payer: Self-pay

## 2021-09-11 ENCOUNTER — Other Ambulatory Visit: Payer: Self-pay | Admitting: Medical

## 2021-09-11 MED ORDER — BUSPIRONE HCL 7.5 MG PO TABS
ORAL_TABLET | Freq: Two times a day (BID) | ORAL | 2 refills | Status: DC
  Filled 2021-09-11: qty 60, 30d supply, fill #0
  Filled 2021-10-23: qty 60, 30d supply, fill #1
  Filled 2021-12-01: qty 60, 30d supply, fill #2

## 2021-09-16 ENCOUNTER — Other Ambulatory Visit (HOSPITAL_COMMUNITY): Payer: Self-pay

## 2021-10-02 ENCOUNTER — Encounter: Payer: No Typology Code available for payment source | Admitting: Medical

## 2021-10-09 ENCOUNTER — Other Ambulatory Visit (HOSPITAL_COMMUNITY): Payer: Self-pay

## 2021-10-09 ENCOUNTER — Other Ambulatory Visit: Payer: Self-pay | Admitting: Medical

## 2021-10-09 MED ORDER — SERTRALINE HCL 100 MG PO TABS
ORAL_TABLET | Freq: Every day | ORAL | 0 refills | Status: DC
  Filled 2021-10-09: qty 30, 30d supply, fill #0

## 2021-10-11 ENCOUNTER — Encounter: Payer: Self-pay | Admitting: Medical

## 2021-10-15 ENCOUNTER — Other Ambulatory Visit (HOSPITAL_COMMUNITY): Payer: Self-pay

## 2021-10-15 ENCOUNTER — Ambulatory Visit (INDEPENDENT_AMBULATORY_CARE_PROVIDER_SITE_OTHER): Payer: 59 | Admitting: Medical

## 2021-10-15 VITALS — BP 128/86 | HR 57 | Temp 98.5°F | Ht 69.0 in | Wt 216.0 lb

## 2021-10-15 DIAGNOSIS — R5383 Other fatigue: Secondary | ICD-10-CM | POA: Diagnosis not present

## 2021-10-15 DIAGNOSIS — E785 Hyperlipidemia, unspecified: Secondary | ICD-10-CM

## 2021-10-15 DIAGNOSIS — H9313 Tinnitus, bilateral: Secondary | ICD-10-CM

## 2021-10-15 DIAGNOSIS — Z807 Family history of other malignant neoplasms of lymphoid, hematopoietic and related tissues: Secondary | ICD-10-CM

## 2021-10-15 DIAGNOSIS — H9193 Unspecified hearing loss, bilateral: Secondary | ICD-10-CM

## 2021-10-15 LAB — COMPREHENSIVE METABOLIC PANEL
ALT: 19 U/L (ref 0–53)
AST: 14 U/L (ref 0–37)
Albumin: 4.6 g/dL (ref 3.5–5.2)
Alkaline Phosphatase: 45 U/L (ref 39–117)
BUN: 24 mg/dL — ABNORMAL HIGH (ref 6–23)
CO2: 31 mEq/L (ref 19–32)
Calcium: 9.7 mg/dL (ref 8.4–10.5)
Chloride: 103 mEq/L (ref 96–112)
Creatinine, Ser: 1.07 mg/dL (ref 0.40–1.50)
GFR: 86.11 mL/min (ref >60.00)
Glucose, Bld: 100 mg/dL — ABNORMAL HIGH (ref 70–99)
Potassium: 4.8 mEq/L (ref 3.5–5.1)
Sodium: 139 mEq/L (ref 135–145)
Total Bilirubin: 0.6 mg/dL (ref 0.2–1.2)
Total Protein: 7.1 g/dL (ref 6.0–8.3)

## 2021-10-15 LAB — LIPID PANEL
Cholesterol: 162 mg/dL (ref 0–200)
HDL: 35.2 mg/dL — ABNORMAL LOW (ref >39.00)
LDL Cholesterol: 91 mg/dL (ref 0–99)
NonHDL: 126.32
Total CHOL/HDL Ratio: 5
Triglycerides: 179 mg/dL — ABNORMAL HIGH (ref 0.0–149.0)
VLDL: 35.8 mg/dL (ref 0.0–40.0)

## 2021-10-15 LAB — VITAMIN B12: Vitamin B-12: 193 pg/mL — ABNORMAL LOW (ref 211–911)

## 2021-10-15 LAB — CBC WITH DIFFERENTIAL/PLATELET
Basophils Absolute: 0 10*3/uL (ref 0.0–0.1)
Basophils Relative: 0.7 % (ref 0.0–3.0)
Eosinophils Absolute: 0.2 10*3/uL (ref 0.0–0.7)
Eosinophils Relative: 4.4 % (ref 0.0–5.0)
HCT: 44.2 % (ref 39.0–52.0)
Hemoglobin: 15 g/dL (ref 13.0–17.0)
Lymphocytes Relative: 32.9 % (ref 12.0–46.0)
Lymphs Abs: 1.8 10*3/uL (ref 0.7–4.0)
MCHC: 34 g/dL (ref 30.0–36.0)
MCV: 85.9 fl (ref 78.0–100.0)
Monocytes Absolute: 0.5 10*3/uL (ref 0.1–1.0)
Monocytes Relative: 8.9 % (ref 3.0–12.0)
Neutro Abs: 3 10*3/uL (ref 1.4–7.7)
Neutrophils Relative %: 53.1 % (ref 43.0–77.0)
Platelets: 174 10*3/uL (ref 150.0–400.0)
RBC: 5.14 Mil/uL (ref 4.22–5.81)
RDW: 13.1 % (ref 11.5–15.5)
WBC: 5.6 10*3/uL (ref 4.0–10.5)

## 2021-10-15 LAB — T4, FREE: Free T4: 0.72 ng/dL (ref 0.60–1.60)

## 2021-10-15 LAB — TSH: TSH: 0.61 u[IU]/mL (ref 0.35–5.50)

## 2021-10-15 NOTE — Progress Notes (Signed)
Subjective:    Patient ID: Jerry Lopez, male    DOB: July 24, 1981, 40 y.o.   MRN: [id number]  HPI Pt in for follow up.  He has high cholesterol. Pt on fenofibrate and atorvastatin.  Pt dad has lymphoma(or other dx as pt not sure?) and pt wants to have labs/cbc. Pt has mild fatigue. No fever, no chills, no sweats and no report of any lymphadenopathy.  Pt has off and on tinnitus/ ringing for months. He also reports that his hearing is decreased. His mom has very poor hearing. Pt sent my chart message and wanted referral to audiologist. No sinus pressure. Pt does shoot guns. He uses ear protection when target practice. When he hunts he does not use ear protection. Occasionally will get ha. He not sure if worrying about ringing causing ha. No associated dizziness or vertigo.  Anxiety is controlled with sertraline and Buspar. Uses xanax sparingly for acute increase of anxiety.   Review of Systems  Constitutional:  Negative for chills, fatigue and fever.  HENT:  Negative for congestion, drooling and ear pain.   Respiratory:  Negative for cough, chest tightness, shortness of breath and wheezing.   Cardiovascular:  Negative for chest pain and palpitations.  Gastrointestinal:  Negative for abdominal distention, abdominal pain, constipation, nausea and vomiting.  Genitourinary:  Negative for dysuria, frequency and hematuria.  Musculoskeletal:  Negative for back pain, myalgias and neck pain.  Skin:  Negative for rash.  Neurological:  Negative for dizziness, syncope, numbness and headaches.  Hematological:  Negative for adenopathy. Does not bruise/bleed easily.  Psychiatric/Behavioral:  Negative for behavioral problems, decreased concentration, dysphoric mood and suicidal ideas. The patient is not nervous/anxious.    Past Medical History:  Diagnosis Date   Anxiety    Arthritis    Chest pain around 12-02-2014   GERD (gastroesophageal reflux disease)    Hypercholesteremia      Social  History   Socioeconomic History   Marital status: Married    Spouse name: Not on file   Number of children: Not on file   Years of education: Not on file   Highest education level: Not on file  Occupational History   Not on file  Tobacco Use   Smoking status: Former    Packs/day: 0.25    Years: 5.00    Pack years: 1.25    Types: Cigarettes    Quit date: 12/26/2015    Years since quitting: 5.8   Smokeless tobacco: Never  Vaping Use   Vaping Use: Never used  Substance and Sexual Activity   Alcohol use: Yes    Alcohol/week: 6.0 standard drinks    Types: 6 Cans of beer per week    Comment: occasional   Drug use: No   Sexual activity: Yes    Birth control/protection: None  Other Topics Concern   Not on file  Social History Narrative   Not on file   Social Determinants of Health   Financial Resource Strain: Not on file  Food Insecurity: Not on file  Transportation Needs: Not on file  Physical Activity: Not on file  Stress: Not on file  Social Connections: Not on file  Intimate Partner Violence: Not on file    Past Surgical History:  Procedure Laterality Date   ELBOW FRACTURE SURGERY Right 07.16.14   Radial Repair   INGUINAL HERNIA REPAIR Right 01/06/2015   Procedure: LAPAROSCOPIC REPAIR OF RIGHT INGUINAL HERNIA WITH MESH;  Surgeon: Greer Pickerel, MD;  Location: [facility];  Service: General;  Laterality: Right;   INSERTION OF MESH Right 01/06/2015   Procedure: INSERTION OF MESH;  Surgeon: Greer Pickerel, MD;  Location: [facility];  Service: General;  Laterality: Right;   LEG SURGERY Right    lower, for fracture age 5   VASECTOMY     VASECTOMY REVERSAL      Family History  Problem Relation Age of Onset   Healthy Mother        Living   Hypertension Father        Living   Non-Hodgkin's lymphoma Father    Diabetes Father    Hypertension Other        Paternal side   Heart attack Paternal Grandfather    Healthy Sister        x1   Healthy Daughter        x3    No Known  Allergies  Current Outpatient Medications on File Prior to Visit  Medication Sig Dispense Refill   ALPRAZolam (XANAX) 0.5 MG tablet Take 1 tablet (0.5 mg total) by mouth daily as needed for severe anxiety or insomnia (max of 1 tablet per day). 8 tablet 0   atorvastatin (LIPITOR) 20 MG tablet Take 1 tablet (20 mg total) by mouth daily. 90 tablet 0   busPIRone (BUSPAR) 7.5 MG tablet TAKE 1 TABLET BY MOUTH 2 TIMES DAILY 60 tablet 2   esomeprazole (NEXIUM) 20 MG capsule Take 1 capsule (20 mg total) by mouth daily at 12 noon. 90 capsule 1   fenofibrate 160 MG tablet TAKE 1 TABLET BY MOUTH ONCE DAILY 90 tablet 1   fenofibrate 160 MG tablet TAKE 1 TABLET BY MOUTH ONCE DAILY 90 tablet 1   sertraline (ZOLOFT) 100 MG tablet TAKE 1 TABLET BY MOUTH ONCE A DAY 30 tablet 0   traZODone (DESYREL) 50 MG tablet Take 1 tablet (50 mg total) by mouth at bedtime. 30 tablet 11   sertraline (ZOLOFT) 100 MG tablet TAKE 1 TABLET BY MOUTH ONCE DAILY 30 tablet 0   No current facility-administered medications on file prior to visit.    BP 128/86    Pulse (!) 57    Temp 98.5 F (36.9 C)    Ht 5\' 9"  (1.753 m)    Wt 216 lb (98 kg)    SpO2 96%    BMI 31.90 kg/m        Objective:   Physical Exam  General Mental Status- Alert. General Appearance- Not in acute distress.   Heet- no sinus pressure. Both canals clear/no wax. Normal tm's.  Neck Carotid Arteries- Normal color. Moisture- Normal Moisture. No carotid bruits. No JVD.  Chest and Lung Exam Auscultation: Breath Sounds:-Normal.  Cardiovascular Auscultation:Rythm- Regular. Murmurs & Other Heart Sounds:Auscultation of the heart reveals- No Murmurs.  Abdomen Inspection:-Inspeection Normal. Palpation/Percussion:Note:No mass. Palpation and Percussion of the abdomen reveal- Non Tender, Non Distended + BS, no rebound or guarding.   Neurologic Cranial Nerve exam:- CN III-XII intact(No nystagmus), symmetric smile. Strength:- 5/5 equal and symmetric strength  both upper and lower extremities.      Assessment & Plan:   Patient Instructions  For tinnitus and ringing of ear referred to ENT and audiologist. If no call by 10 days please update me.  For high cholesterol will get cmp and lipid panel today.  For fh of lymphoma and mild fatigue will get cbc.  For fatigue added b12, b1 and thyroid studies.  Anxiety overall well controlled. Continue sertraline, buspar and xanax(for rare acute increase of  anxiety as we disussed).   Mackie Pai, PA-C

## 2021-10-15 NOTE — Patient Instructions (Addendum)
For tinnitus and ringing of ear referred to ENT and audiologist. If no call by 10 days please update me.  For high cholesterol will get cmp and lipid panel today.  For fh of lymphoma and mild fatigue will get cbc.  For fatigue added b12, b1 and thyroid studies.  Anxiety overall well controlled. Continue sertraline, buspar and xanax(for rare acute increase of anxiety as we disussed).

## 2021-10-16 ENCOUNTER — Other Ambulatory Visit (HOSPITAL_COMMUNITY): Payer: Self-pay

## 2021-10-16 MED ORDER — CYANOCOBALAMIN 1000 MCG/ML IJ SOLN
1000.0000 ug | Freq: Once | INTRAMUSCULAR | 0 refills | Status: AC
  Filled 2021-10-16: qty 4, 28d supply, fill #0
  Filled 2021-12-18: qty 4, 28d supply, fill #1
  Filled 2021-12-18: qty 3, 90d supply, fill #1
  Filled 2022-03-24: qty 3, 90d supply, fill #2

## 2021-10-16 MED ORDER — "BD LUER-LOK SYRINGE 25G X 5/8"" 3 ML MISC"
0 refills | Status: DC
  Filled 2021-10-16: qty 5, 180d supply, fill #0
  Filled 2022-04-28: qty 10, 10d supply, fill #0

## 2021-10-21 LAB — VITAMIN B1: Vitamin B1 (Thiamine): 9 nmol/L (ref 8–30)

## 2021-10-23 ENCOUNTER — Other Ambulatory Visit (HOSPITAL_COMMUNITY): Payer: Self-pay

## 2021-10-28 ENCOUNTER — Telehealth: Payer: Self-pay | Admitting: Medical

## 2021-10-28 DIAGNOSIS — H9193 Unspecified hearing loss, bilateral: Secondary | ICD-10-CM

## 2021-10-28 DIAGNOSIS — H9313 Tinnitus, bilateral: Secondary | ICD-10-CM

## 2021-10-28 NOTE — Telephone Encounter (Signed)
New ent referral placed/changes. See referral.

## 2021-11-04 ENCOUNTER — Other Ambulatory Visit: Payer: Self-pay | Admitting: Medical

## 2021-11-04 DIAGNOSIS — E785 Hyperlipidemia, unspecified: Secondary | ICD-10-CM

## 2021-11-05 ENCOUNTER — Other Ambulatory Visit (HOSPITAL_COMMUNITY): Payer: Self-pay

## 2021-11-05 MED ORDER — FENOFIBRATE 160 MG PO TABS
ORAL_TABLET | Freq: Every day | ORAL | 1 refills | Status: DC
  Filled 2021-11-05: qty 90, 90d supply, fill #0
  Filled 2022-02-15: qty 90, 90d supply, fill #1

## 2021-11-09 ENCOUNTER — Other Ambulatory Visit (HOSPITAL_COMMUNITY): Payer: Self-pay

## 2021-11-09 ENCOUNTER — Other Ambulatory Visit: Payer: Self-pay | Admitting: Medical

## 2021-11-09 MED ORDER — SERTRALINE HCL 100 MG PO TABS
ORAL_TABLET | Freq: Every day | ORAL | 0 refills | Status: DC
  Filled 2021-11-09: qty 30, 30d supply, fill #0

## 2021-11-15 ENCOUNTER — Ambulatory Visit: Payer: Self-pay

## 2021-11-16 ENCOUNTER — Telehealth: Payer: Self-pay | Admitting: Nurse Practitioner

## 2021-11-16 ENCOUNTER — Ambulatory Visit
Admission: EM | Admit: 2021-11-16 | Discharge: 2021-11-16 | Disposition: A | Discharge status: Home / Self Care | Payer: No Typology Code available for payment source | Attending: Emergency Medicine | Admitting: Emergency Medicine

## 2021-11-16 ENCOUNTER — Other Ambulatory Visit: Payer: Self-pay

## 2021-11-16 DIAGNOSIS — B9689 Other specified bacterial agents as the cause of diseases classified elsewhere: Secondary | ICD-10-CM

## 2021-11-16 DIAGNOSIS — R051 Acute cough: Secondary | ICD-10-CM

## 2021-11-16 DIAGNOSIS — J209 Acute bronchitis, unspecified: Secondary | ICD-10-CM

## 2021-11-16 DIAGNOSIS — J310 Chronic rhinitis: Secondary | ICD-10-CM

## 2021-11-16 DIAGNOSIS — J208 Acute bronchitis due to other specified organisms: Secondary | ICD-10-CM | POA: Diagnosis not present

## 2021-11-16 DIAGNOSIS — J329 Chronic sinusitis, unspecified: Secondary | ICD-10-CM

## 2021-11-16 MED ORDER — METHYLPREDNISOLONE SODIUM SUCC 125 MG IJ SOLR
125.0000 mg | Freq: Once | INTRAMUSCULAR | Status: AC
  Administered 2021-11-16: 125 mg via INTRAMUSCULAR

## 2021-11-16 MED ORDER — GUAIFENESIN 400 MG PO TABS: ORAL_TABLET | ORAL | 0 refills | Status: DC

## 2021-11-16 MED ORDER — FLUTICASONE PROPIONATE 50 MCG/ACT NA SUSP: 2.0000 | Freq: Every day | NASAL | 0 refills | Status: DC

## 2021-11-16 MED ORDER — PSEUDOEPHEDRINE HCL 30 MG PO TABS: 60.0000 mg | ORAL_TABLET | Freq: Three times a day (TID) | ORAL | 0 refills | Status: AC | PRN

## 2021-11-16 MED ORDER — PROMETHAZINE-DM 6.25-15 MG/5ML PO SYRP: 5.0000 mL | ORAL_SOLUTION | Freq: Four times a day (QID) | ORAL | 0 refills | Status: DC | PRN

## 2021-11-16 MED ORDER — IBUPROFEN 400 MG PO TABS: 400.0000 mg | ORAL_TABLET | Freq: Three times a day (TID) | ORAL | 0 refills | Status: DC | PRN

## 2021-11-16 MED ORDER — CETIRIZINE HCL 10 MG PO TABS: 10.0000 mg | ORAL_TABLET | Freq: Every day | ORAL | 2 refills | Status: DC

## 2021-11-16 MED ORDER — AMOXICILLIN-POT CLAVULANATE 875-125 MG PO TABS: 1.0000 | ORAL_TABLET | Freq: Two times a day (BID) | ORAL | 0 refills | Status: AC

## 2021-11-16 NOTE — Discharge Instructions (Addendum)
Your symptoms and physical exam findings are concerning for a viral respiratory infection.  Based on my physical exam findings, I believe you are suffering from an opportunistic bacterial bronchitis superimposed on your original influenza infection.   Your symptoms and my physical exam findings are also concerning for exacerbation of your underlying allergies.  It is important that you are consistent with taking allergy medications exactly as prescribed.  Not doing so increases your risk of more frequent upper respiratory infections that may or may not require the use of antibiotics, serious exacerbations that require the use of oral steroids, loss of time at work and missed social opportunities.     Please see the list below for recommended medications, dosages and frequencies to provide relief of your current symptoms:     Amoxicillin-clavulanate (Augmentin): This is an antibiotic that we will aggressively eradicate any bacteria that are causing you to have bacterial bronchitis at this time.  Please take 1 tablet twice daily for 7 days, you can take it with or without food.  This antibiotic can cause upset stomach, this will resolve once antibiotics are complete.  You are welcome to use a probiotic, eat yogurt, take Imodium while taking this medication.  Please avoid other systemic medications such as Maalox, Pepto-Bismol or milk of magnesia as they can interfere with your body's ability to absorb the antibiotics.   Methylprednisolone IM (Solu-Medrol):  To quickly address your significant respiratory inflammation, you were provided with an injection of methylprednisolone in the office today.  You should continue to feel the full benefit of the steroid for the next 4 to 6 hours.  Rather than continue oral steroids, I recommend that you continue anti-inflammatory treatment with ibuprofen.   Ibuprofen  (Advil, Motrin): This is a good anti-inflammatory medication which addresses aches, pains and  inflammation of the upper airways that causes sinus and nasal congestion as well as in the lower airways which makes your cough feel tight and sometimes burn.  I recommend that you take between 400 to 600 mg every 6-8 hours as needed, I have provided you with a prescription for 400 mg.      Fluticasone (Flonase): This is a steroid nasal spray that you use once daily, 1 spray in each nare.  After 3 to 5 days of use, you will have significant improvement of the inflammation and mucus production that is being caused by exposure to allergens.  This medication can be purchased over-the-counter however I have provided you with a prescription.      Cetirizine (Zyrtec): This is an excellent second-generation antihistamine that helps to reduce respiratory inflammatory response to viruses and environmental allergens.  Please take 1 tablet daily at bedtime.  I provided you with a prescription of this medication.   Pseudoephedrine (Sudafed): This is a decongestant.  This medication has to be purchased from the pharmacist counter, I recommend taking 2 tablets, 60 mg, 2-3 times a day as needed to relieve runny nose and sinus drainage.  This medication is available over-the-counter however I have sent a prescription for your convenience.   Guaifenesin (Robitussin, Mucinex): This is an expectorant.  This helps break up chest congestion and loosen up thick nasal drainage making phlegm and drainage more liquid and therefore easier to remove.  I recommend being 400 mg three times daily as needed.  I have provide you with a prescription of medication.   Promethazine DM: Promethazine is both a nasal decongestant and an antinausea medication that makes most patients feel fairly  sleepy.  The DM is dextromethorphan, a cough suppressant found in many over-the-counter cough medications.  Please take 5 mL before bedtime to minimize your cough which will help you sleep better.  I have provided you with a prescription for this  medication.      Conservative care is also recommended at this time.  This includes rest, pushing clear fluids and activity as tolerated.  Warm beverages such as teas and broths versus cold beverages/popsicles and frozen sherbet/sorbet are your choice, both warm and cold are beneficial.  You may also notice that your appetite is reduced; this is okay as long as you are drinking plenty of clear fluids.   I have also provided you with a prescription for Nexium 40 mg to take once daily.    The pain in the central part of your chest is likely bruising secondary to violent coughing, your xiphoid process is likely a little sore and will recover with a little time in patients.  Regular doses of ibuprofen certainly will help reduce inflammation in that area, applying ice pack to the area will also provide some relief.   Please remain home from work, school, public places until you have been fever free for 24 hours without the use of antifever medications such as Tylenol or ibuprofen.    Please follow-up within the next 3 to 5 days either with your primary care provider or urgent care if your symptoms do not resolve.  If you do not have a primary care provider, we will assist you in finding one.

## 2021-11-16 NOTE — ED Triage Notes (Signed)
Pt reports Saturday he had a fever (101F), and today he woke up with a cough and soreness to epigastric region.  At home covid test on Saturday and today were negative per patient.

## 2021-11-16 NOTE — ED Provider Notes (Signed)
[facility]    CSN: [id number] Arrival date & time: 11/16/21  1144    HISTORY   Chief Complaint  Patient presents with   Fever   Abdominal Pain   HPI Jerry Lopez is a 41 y.o. male. Pt reports Saturday he had a fever (101F), and today he woke up with a cough and soreness in his upper mid abdomen near his xiphoid process.  At home covid test on Saturday and today were negative per patient.  Patient states he is not sleeping well at night secondary to cough.  Patient states his wife is a Marine scientist and encouraged him to be evaluated today to rule out pneumonia.  Patient states his last dose of Tylenol was this morning prior to admission today.  The history is provided by the patient.  Past Medical History:  Diagnosis Date   Anxiety    Arthritis    Chest pain around 12-02-2014   GERD (gastroesophageal reflux disease)    Hypercholesteremia    Patient Active Problem List   Diagnosis Date Noted   Visit for preventive health examination 01/03/2018   Physical deconditioning 11/21/2016   Chronic pain of right inguinal region 10/08/2014   Hyperlipidemia 05/23/2014   Insomnia 11/15/2013   Encounter for preventive health examination 08/08/2013   Anxiety and depression 08/08/2013   GERD (gastroesophageal reflux disease) 08/08/2013   Needs smoking cessation education 08/08/2013   Past Surgical History:  Procedure Laterality Date   ELBOW FRACTURE SURGERY Right 07.16.14   Radial Repair   INGUINAL HERNIA REPAIR Right 01/06/2015   Procedure: LAPAROSCOPIC REPAIR OF RIGHT INGUINAL HERNIA WITH MESH;  Surgeon: Greer Pickerel, MD;  Location: [facility];  Service: General;  Laterality: Right;   INSERTION OF MESH Right 01/06/2015   Procedure: INSERTION OF MESH;  Surgeon: Greer Pickerel, MD;  Location: [facility];  Service: General;  Laterality: Right;   LEG SURGERY Right    lower, for fracture age 81   [facility] Medications    Prior to Admission medications    Medication Sig Start Date End Date Taking? Authorizing Provider  ALPRAZolam Duanne Moron) 0.5 MG tablet Take 1 tablet (0.5 mg total) by mouth daily as needed for severe anxiety or insomnia (max of 1 tablet per day). 07/21/21   Saguier, Percell Miller, PA-C  atorvastatin (LIPITOR) 20 MG tablet Take 1 tablet (20 mg total) by mouth daily. 08/24/21   Saguier, Percell Miller, PA-C  busPIRone (BUSPAR) 7.5 MG tablet TAKE 1 TABLET BY MOUTH 2 TIMES DAILY 09/11/21 09/11/22  Saguier, Percell Miller, PA-C  esomeprazole (NEXIUM) 20 MG capsule Take 1 capsule (20 mg total) by mouth daily at 12 noon. 06/04/19   Brunetta Jeans, PA-C  fenofibrate 160 MG tablet TAKE 1 TABLET BY MOUTH ONCE DAILY 11/05/21 11/05/22  Saguier, Percell Miller, PA-C  sertraline (ZOLOFT) 100 MG tablet TAKE 1 TABLET BY MOUTH ONCE DAILY 09/25/20 09/25/21  Saguier, Percell Miller, PA-C  sertraline (ZOLOFT) 100 MG tablet TAKE 1 TABLET BY MOUTH ONCE A DAY 11/09/21 11/09/22  Saguier, Percell Miller, PA-C  SYRINGE-NEEDLE, DISP, 3 ML (B-D 3CC LUER-LOK SYR 25GX5/8") 25G X 5/8" 3 ML MISC Use to inject medication once weekly for 4 weeks then once monthly for 5 months 10/16/21   Saguier, Percell Miller, PA-C  traZODone (DESYREL) 50 MG tablet Take 1 tablet (50 mg total) by mouth at bedtime. 07/21/21   Saguier, Percell Miller, PA-C   Family History Family History  Problem Relation Age of Onset  Healthy Mother        Living   Hypertension Father        Living   Non-Hodgkin's lymphoma Father    Diabetes Father    Hypertension Other        Paternal side   Heart attack Paternal Grandfather    Healthy Sister        x1   Healthy Daughter        x3   Social History Social History   Tobacco Use   Smoking status: Former    Packs/day: 0.25    Years: 5.00    Pack years: 1.25    Types: Cigarettes    Quit date: 12/26/2015    Years since quitting: 5.8   Smokeless tobacco: Never  Vaping Use   Vaping Use: Never used  Substance Use Topics   Alcohol use: Yes    Alcohol/week: 6.0 standard drinks    Types: 6 Cans of beer  per week    Comment: occasional   Drug use: No   Allergies   Patient has no known allergies.  Review of Systems Review of Systems Pertinent findings noted in history of present illness.   Physical Exam Triage Vital Signs ED Triage Vitals  Enc Vitals Group     BP 08/21/21 0827 (!) 147/82     Pulse Rate 08/21/21 0827 72     Resp 08/21/21 0827 18     Temp 08/21/21 0827 98.3 F (36.8 C)     Temp Source 08/21/21 0827 Oral     SpO2 08/21/21 0827 98 %     Weight --      Height --      Head Circumference --      Peak Flow --      Pain Score 08/21/21 0826 5     Pain Loc --      Pain Edu? --      Excl. in [place]? --   No data found.  Updated Vital Signs BP (!) 132/92 (BP Location: Right Arm)    Pulse 76    Temp 98 F (36.7 C) (Oral)    Resp 18    SpO2 98%   Physical Exam Vitals and nursing note reviewed.  Constitutional:      General: He is not in acute distress.    Appearance: Normal appearance. He is obese. He is ill-appearing.  HENT:     Head: Normocephalic and atraumatic.     Salivary Glands: Right salivary gland is not diffusely enlarged or tender. Left salivary gland is not diffusely enlarged or tender.     Right Ear: Hearing and external ear normal. No drainage. A middle ear effusion is present. There is no impacted cerumen. Tympanic membrane is not erythematous or bulging.     Left Ear: Hearing and external ear normal. No drainage. A middle ear effusion is present. There is no impacted cerumen. Tympanic membrane is not erythematous or bulging.     Ears:     Comments: Bilateral TMs bulging with clear fluid, bilateral EACs mildly erythematous without edema    Nose: Mucosal edema, congestion and rhinorrhea present. No nasal deformity or septal deviation. Rhinorrhea is clear.     Right Turbinates: Enlarged. Not swollen or pale.     Left Turbinates: Enlarged. Not swollen or pale.     Right Sinus: No maxillary sinus tenderness or frontal sinus tenderness.     Left Sinus: No  maxillary sinus tenderness or frontal sinus tenderness.  Mouth/Throat:     Lips: Pink. No lesions.     Mouth: Mucous membranes are moist. No oral lesions.     Pharynx: Oropharynx is clear. Uvula midline. No posterior oropharyngeal erythema or uvula swelling.     Tonsils: No tonsillar exudate. 3+ on the right. 3+ on the left.     Comments: Patient reports history of enlarged tonsils, tonsils are not erythematous and are without exudate. Eyes:     General: Lids are normal.        Right eye: No discharge.        Left eye: No discharge.     Extraocular Movements: Extraocular movements intact.     Conjunctiva/sclera: Conjunctivae normal.     Right eye: Right conjunctiva is not injected. No exudate.    Left eye: Left conjunctiva is not injected. No exudate. Neck:     Trachea: Trachea and phonation normal.     Comments: Supraclavicular lymphadenopathy bilaterally Cardiovascular:     Rate and Rhythm: Normal rate and regular rhythm.     Pulses: Normal pulses.     Heart sounds: Normal heart sounds. No murmur heard.   No friction rub. No gallop.  Pulmonary:     Effort: Pulmonary effort is normal. No tachypnea, bradypnea, accessory muscle usage, prolonged expiration, respiratory distress or retractions.     Breath sounds: No stridor, decreased air movement or transmitted upper airway sounds. Examination of the right-lower field reveals decreased breath sounds. Examination of the left-lower field reveals decreased breath sounds. Decreased breath sounds present. No wheezing, rhonchi or rales.     Comments: Bronchospasm appreciated with cough and middle lung fields Chest:     Chest wall: No tenderness.  Abdominal:     General: There is no distension or abdominal bruit. There are no signs of injury.     Comments: Palpable tenderness at xiphoid process, likely secondary to aggressive coughing.  Musculoskeletal:        General: Normal range of motion.     Cervical back: Full passive range of  motion without pain, normal range of motion and neck supple. Normal range of motion.  Lymphadenopathy:     Cervical: Cervical adenopathy present.     Right cervical: Superficial cervical adenopathy and posterior cervical adenopathy present.     Left cervical: Superficial cervical adenopathy and posterior cervical adenopathy present.  Skin:    General: Skin is warm and dry.     Findings: No erythema or rash.  Neurological:     General: No focal deficit present.     Mental Status: He is alert and oriented to person, place, and time.  Psychiatric:        Mood and Affect: Mood normal.        Behavior: Behavior normal.    Visual Acuity Right Eye Distance:   Left Eye Distance:   Bilateral Distance:    Right Eye Near:   Left Eye Near:    Bilateral Near:     UC Couse / Diagnostics / Procedures:    EKG  Radiology No results found.  Procedures Procedures (including critical care time)  UC Diagnoses / Final Clinical Impressions(s)   I have reviewed the triage vital signs and the nursing notes.  Pertinent labs & imaging results that were available during my care of the patient were reviewed by me and considered in my medical decision making (see chart for details).   Final diagnoses:  Acute bacterial bronchitis  Bronchospasm with bronchitis, acute  Rhinosinusitis   Patient  received an injection of methylprednisolone in the office along with a prescription for Augmentin.  Supportive medications prescribed as well, conservative care recommended.  Note provided for work.  Return precautions advised.   ED Prescriptions     Medication Sig Dispense Auth. Provider   amoxicillin-clavulanate (AUGMENTIN) 875-125 MG tablet Take 1 tablet by mouth 2 (two) times daily for 7 days. 14 tablet Lynden Oxford Scales, PA-C   fluticasone (FLONASE) 50 MCG/ACT nasal spray Place 2 sprays into both nostrils daily. 18 mL Lynden Oxford Scales, PA-C   ibuprofen (ADVIL) 400 MG tablet Take 1 tablet (400  mg total) by mouth every 8 (eight) hours as needed for up to 30 doses. 30 tablet Lynden Oxford Scales, PA-C   guaifenesin (HUMIBID E) 400 MG TABS tablet Take 1 tablet 3 times daily as needed for chest congestion and cough 21 tablet Lynden Oxford Scales, PA-C   promethazine-dextromethorphan (PROMETHAZINE-DM) 6.25-15 MG/5ML syrup Take 5 mLs by mouth 4 (four) times daily as needed for cough. 180 mL Lynden Oxford Scales, PA-C   cetirizine (ZYRTEC ALLERGY) 10 MG tablet Take 1 tablet (10 mg total) by mouth at bedtime. 30 tablet Lynden Oxford Scales, PA-C   pseudoephedrine (SUDAFED) 30 MG tablet Take 2 tablets (60 mg total) by mouth every 8 (eight) hours as needed for up to 3 days for congestion. 18 tablet Lynden Oxford Scales, PA-C      PDMP not reviewed this encounter.  Pending results:  Labs Reviewed - No data to display  Medications Ordered in UC: Medications  methylPREDNISolone sodium succinate (SOLU-MEDROL) 125 mg/2 mL injection 125 mg (has no administration in time range)    Disposition Upon Discharge:  Condition: stable for discharge home Home: take medications as prescribed; routine discharge instructions as discussed; follow up as advised.  Patient presented with an acute illness with associated systemic symptoms and significant discomfort requiring urgent management. In my opinion, this is a condition that a prudent lay person (someone who possesses an average knowledge of health and medicine) may potentially expect to result in complications if not addressed urgently such as respiratory distress, impairment of bodily function or dysfunction of bodily organs.   Routine symptom specific, illness specific and/or disease specific instructions were discussed with the patient and/or caregiver at length.   As such, the patient has been evaluated and assessed, work-up was performed and treatment was provided in alignment with urgent care protocols and evidence based medicine.   Patient/parent/caregiver has been advised that the patient may require follow up for further testing and treatment if the symptoms continue in spite of treatment, as clinically indicated and appropriate.  If the patient was tested for COVID-19, Influenza and/or RSV, then the patient/parent/guardian was advised to isolate at home pending the results of his/her diagnostic coronavirus test and potentially longer if theyre positive. I have also advised pt that if his/her COVID-19 test returns positive, it's recommended to self-isolate for at least 10 days after symptoms first appeared AND until fever-free for 24 hours without fever reducer AND other symptoms have improved or resolved. Discussed self-isolation recommendations as well as instructions for household member/close contacts as per the [facility] and [place] DHHS, and also gave patient the [place] packet with this information.  Patient/parent/caregiver has been advised to return to the [facility] or PCP in 3-5 days if no better; to PCP or the Emergency Department if new signs and symptoms develop, or if the current signs or symptoms continue to change or worsen for further workup, evaluation and treatment as  clinically indicated and appropriate  The patient will follow up with their current PCP if and as advised. If the patient does not currently have a PCP we will assist them in obtaining one.   The patient may need specialty follow up if the symptoms continue, in spite of conservative treatment and management, for further workup, evaluation, consultation and treatment as clinically indicated and appropriate.  Patient/parent/caregiver verbalized understanding and agreement of plan as discussed.  All questions were addressed during visit.  Please see discharge instructions below for further details of plan.  Discharge Instructions:   Discharge Instructions      Your symptoms and physical exam findings are concerning for a viral respiratory infection.  Based on my  physical exam findings, I believe you are suffering from an opportunistic bacterial bronchitis superimposed on your original influenza infection.   Your symptoms and my physical exam findings are also concerning for exacerbation of your underlying allergies.  It is important that you are consistent with taking allergy medications exactly as prescribed.  Not doing so increases your risk of more frequent upper respiratory infections that may or may not require the use of antibiotics, serious exacerbations that require the use of oral steroids, loss of time at work and missed social opportunities.     Please see the list below for recommended medications, dosages and frequencies to provide relief of your current symptoms:     Amoxicillin-clavulanate (Augmentin): This is an antibiotic that we will aggressively eradicate any bacteria that are causing you to have bacterial bronchitis at this time.  Please take 1 tablet twice daily for 7 days, you can take it with or without food.  This antibiotic can cause upset stomach, this will resolve once antibiotics are complete.  You are welcome to use a probiotic, eat yogurt, take Imodium while taking this medication.  Please avoid other systemic medications such as Maalox, Pepto-Bismol or milk of magnesia as they can interfere with your body's ability to absorb the antibiotics.   Methylprednisolone IM (Solu-Medrol):  To quickly address your significant respiratory inflammation, you were provided with an injection of methylprednisolone in the office today.  You should continue to feel the full benefit of the steroid for the next 4 to 6 hours.  Rather than continue oral steroids, I recommend that you continue anti-inflammatory treatment with ibuprofen.   Ibuprofen  (Advil, Motrin): This is a good anti-inflammatory medication which addresses aches, pains and inflammation of the upper airways that causes sinus and nasal congestion as well as in the lower airways which  makes your cough feel tight and sometimes burn.  I recommend that you take between 400 to 600 mg every 6-8 hours as needed, I have provided you with a prescription for 400 mg.      Fluticasone (Flonase): This is a steroid nasal spray that you use once daily, 1 spray in each nare.  After 3 to 5 days of use, you will have significant improvement of the inflammation and mucus production that is being caused by exposure to allergens.  This medication can be purchased over-the-counter however I have provided you with a prescription.      Cetirizine (Zyrtec): This is an excellent second-generation antihistamine that helps to reduce respiratory inflammatory response to viruses and environmental allergens.  Please take 1 tablet daily at bedtime.  I provided you with a prescription of this medication.   Pseudoephedrine (Sudafed): This is a decongestant.  This medication has to be purchased from the pharmacist counter, I recommend  taking 2 tablets, 60 mg, 2-3 times a day as needed to relieve runny nose and sinus drainage.  This medication is available over-the-counter however I have sent a prescription for your convenience.   Guaifenesin (Robitussin, Mucinex): This is an expectorant.  This helps break up chest congestion and loosen up thick nasal drainage making phlegm and drainage more liquid and therefore easier to remove.  I recommend being 400 mg three times daily as needed.  I have provide you with a prescription of medication.   Promethazine DM: Promethazine is both a nasal decongestant and an antinausea medication that makes most patients feel fairly sleepy.  The DM is dextromethorphan, a cough suppressant found in many over-the-counter cough medications.  Please take 5 mL before bedtime to minimize your cough which will help you sleep better.  I have provided you with a prescription for this medication.      Conservative care is also recommended at this time.  This includes rest, pushing clear fluids and  activity as tolerated.  Warm beverages such as teas and broths versus cold beverages/popsicles and frozen sherbet/sorbet are your choice, both warm and cold are beneficial.  You may also notice that your appetite is reduced; this is okay as long as you are drinking plenty of clear fluids.   I have also provided you with a prescription for Nexium 40 mg to take once daily.    The pain in the central part of your chest is likely bruising secondary to violent coughing, your xiphoid process is likely a little sore and will recover with a little time in patients.  Regular doses of ibuprofen certainly will help reduce inflammation in that area, applying ice pack to the area will also provide some relief.   Please remain home from work, school, public places until you have been fever free for 24 hours without the use of antifever medications such as Tylenol or ibuprofen.    Please follow-up within the next 3 to 5 days either with your primary care provider or urgent care if your symptoms do not resolve.  If you do not have a primary care provider, we will assist you in finding one.       This office note has been dictated using Museum/gallery curator.  Unfortunately, and despite my best efforts, this method of dictation can sometimes lead to occasional typographical or grammatical errors.  I apologize in advance if this occurs.     Lynden Oxford Scales, PA-C 11/16/21 1259

## 2021-11-16 NOTE — Progress Notes (Signed)
°  I agree since you have been sick this long I would want to check a Chest Xray. You can get flu again, but likely this is a secondary infection from the flu you had in December- like pneumonia.   We cannot order Chest Xrays through this service so we will need to refer you to Urgent Care- You will not be charged for this visit.   Based on what you shared with me, I feel your condition warrants further evaluation and I recommend that you be seen in a face to face visit.   NOTE: There will be NO CHARGE for this eVisit   If you are having a true medical emergency please call 911.      For an urgent face to face visit, [place] has six urgent care centers for your convenience:     [facility] [place] [facility] [place] Get Driving Directions [phone] [place], [place] [address]    [facility] [place]) Get Driving Directions [phone] [place], [place] [address]  [facility] [place] [facility]) Get Driving Directions [phone] [address] [place],  [place]  [address]  [facility] Get Driving Directions [phone] [place], [place], [place] [address]   [facility] Get Driving Directions  [phone] [address] [place], [place] [address]   [facility] [place] Get Driving Directions [phone] [address], [place], [place] [address]  Your MyChart E-visit questionnaire answers were reviewed by a board certified advanced clinical practitioner to complete your personal care plan based on your specific symptoms.  Thank you for using e-Visits.

## 2021-11-18 ENCOUNTER — Other Ambulatory Visit (HOSPITAL_COMMUNITY): Payer: Self-pay

## 2021-12-01 ENCOUNTER — Other Ambulatory Visit (HOSPITAL_COMMUNITY): Payer: Self-pay

## 2021-12-07 ENCOUNTER — Other Ambulatory Visit (HOSPITAL_COMMUNITY): Payer: Self-pay

## 2021-12-07 ENCOUNTER — Other Ambulatory Visit: Payer: Self-pay | Admitting: Medical

## 2021-12-07 DIAGNOSIS — E785 Hyperlipidemia, unspecified: Secondary | ICD-10-CM

## 2021-12-07 MED ORDER — ATORVASTATIN CALCIUM 20 MG PO TABS
20.0000 mg | ORAL_TABLET | Freq: Every day | ORAL | 0 refills | Status: DC
  Filled 2021-12-07: qty 90, 90d supply, fill #0

## 2021-12-18 ENCOUNTER — Other Ambulatory Visit (HOSPITAL_COMMUNITY): Payer: Self-pay

## 2021-12-23 ENCOUNTER — Other Ambulatory Visit (HOSPITAL_COMMUNITY): Payer: Self-pay

## 2021-12-23 ENCOUNTER — Other Ambulatory Visit: Payer: Self-pay | Admitting: Medical

## 2021-12-23 MED ORDER — SERTRALINE HCL 100 MG PO TABS
ORAL_TABLET | Freq: Every day | ORAL | 0 refills | Status: DC
  Filled 2021-12-23: qty 30, 30d supply, fill #0

## 2021-12-31 ENCOUNTER — Other Ambulatory Visit: Payer: Self-pay | Admitting: Medical

## 2021-12-31 ENCOUNTER — Other Ambulatory Visit (HOSPITAL_COMMUNITY): Payer: Self-pay

## 2021-12-31 MED ORDER — BUSPIRONE HCL 7.5 MG PO TABS
ORAL_TABLET | Freq: Two times a day (BID) | ORAL | 2 refills | Status: DC
  Filled 2021-12-31: qty 60, 30d supply, fill #0
  Filled 2022-02-02: qty 60, 30d supply, fill #1
  Filled 2022-03-11: qty 60, 30d supply, fill #2

## 2022-01-14 ENCOUNTER — Other Ambulatory Visit (HOSPITAL_COMMUNITY): Payer: Self-pay

## 2022-01-22 ENCOUNTER — Other Ambulatory Visit (HOSPITAL_COMMUNITY): Payer: Self-pay

## 2022-01-22 ENCOUNTER — Other Ambulatory Visit: Payer: Self-pay | Admitting: Medical

## 2022-01-22 MED ORDER — SERTRALINE HCL 100 MG PO TABS
ORAL_TABLET | Freq: Every day | ORAL | 0 refills | Status: DC
  Filled 2022-01-22: qty 30, 30d supply, fill #0

## 2022-02-02 ENCOUNTER — Other Ambulatory Visit (HOSPITAL_COMMUNITY): Payer: Self-pay

## 2022-02-15 ENCOUNTER — Other Ambulatory Visit (HOSPITAL_COMMUNITY): Payer: Self-pay

## 2022-03-03 ENCOUNTER — Other Ambulatory Visit (HOSPITAL_COMMUNITY): Payer: Self-pay

## 2022-03-03 ENCOUNTER — Other Ambulatory Visit: Payer: Self-pay | Admitting: Medical

## 2022-03-03 MED ORDER — SERTRALINE HCL 100 MG PO TABS
ORAL_TABLET | Freq: Every day | ORAL | 0 refills | Status: DC
  Filled 2022-03-03: qty 30, 30d supply, fill #0

## 2022-03-11 ENCOUNTER — Other Ambulatory Visit (HOSPITAL_COMMUNITY): Payer: Self-pay

## 2022-03-11 ENCOUNTER — Other Ambulatory Visit: Payer: Self-pay | Admitting: Medical

## 2022-03-11 DIAGNOSIS — E785 Hyperlipidemia, unspecified: Secondary | ICD-10-CM

## 2022-03-11 MED ORDER — ATORVASTATIN CALCIUM 20 MG PO TABS
20.0000 mg | ORAL_TABLET | Freq: Every day | ORAL | 0 refills | Status: DC
  Filled 2022-03-11: qty 90, 90d supply, fill #0

## 2022-03-23 ENCOUNTER — Other Ambulatory Visit (HOSPITAL_COMMUNITY): Payer: Self-pay

## 2022-03-24 ENCOUNTER — Other Ambulatory Visit (HOSPITAL_COMMUNITY): Payer: Self-pay

## 2022-04-03 ENCOUNTER — Other Ambulatory Visit (HOSPITAL_COMMUNITY): Payer: Self-pay

## 2022-04-03 ENCOUNTER — Other Ambulatory Visit: Payer: Self-pay | Admitting: Medical

## 2022-04-05 ENCOUNTER — Other Ambulatory Visit (HOSPITAL_COMMUNITY): Payer: Self-pay

## 2022-04-05 MED ORDER — SERTRALINE HCL 100 MG PO TABS
ORAL_TABLET | Freq: Every day | ORAL | 0 refills | Status: DC
  Filled 2022-04-05: qty 30, 30d supply, fill #0

## 2022-04-17 ENCOUNTER — Other Ambulatory Visit: Payer: Self-pay | Admitting: Medical

## 2022-04-19 ENCOUNTER — Other Ambulatory Visit (HOSPITAL_COMMUNITY): Payer: Self-pay

## 2022-04-19 MED ORDER — BUSPIRONE HCL 7.5 MG PO TABS
ORAL_TABLET | Freq: Two times a day (BID) | ORAL | 2 refills | Status: DC
  Filled 2022-04-19: qty 60, 30d supply, fill #0
  Filled 2022-05-20: qty 60, 30d supply, fill #1
  Filled 2022-06-18: qty 60, 30d supply, fill #2

## 2022-04-28 ENCOUNTER — Other Ambulatory Visit (HOSPITAL_COMMUNITY): Payer: Self-pay

## 2022-04-28 ENCOUNTER — Other Ambulatory Visit: Payer: Self-pay | Admitting: Medical

## 2022-04-29 ENCOUNTER — Other Ambulatory Visit (HOSPITAL_COMMUNITY): Payer: Self-pay

## 2022-04-30 ENCOUNTER — Other Ambulatory Visit (HOSPITAL_COMMUNITY): Payer: Self-pay

## 2022-05-03 ENCOUNTER — Other Ambulatory Visit (HOSPITAL_COMMUNITY): Payer: Self-pay

## 2022-05-03 ENCOUNTER — Other Ambulatory Visit: Payer: Self-pay | Admitting: Medical

## 2022-05-03 MED ORDER — SERTRALINE HCL 100 MG PO TABS
ORAL_TABLET | Freq: Every day | ORAL | 0 refills | Status: DC
  Filled 2022-05-03: qty 30, 30d supply, fill #0

## 2022-05-12 ENCOUNTER — Encounter: Payer: Self-pay | Admitting: Medical

## 2022-05-12 NOTE — Telephone Encounter (Signed)
Called pt LVM to call office to make that appt

## 2022-05-20 ENCOUNTER — Other Ambulatory Visit (HOSPITAL_COMMUNITY): Payer: Self-pay

## 2022-05-20 ENCOUNTER — Other Ambulatory Visit: Payer: Self-pay | Admitting: Medical

## 2022-05-20 DIAGNOSIS — E785 Hyperlipidemia, unspecified: Secondary | ICD-10-CM

## 2022-05-20 MED ORDER — FENOFIBRATE 160 MG PO TABS
ORAL_TABLET | Freq: Every day | ORAL | 1 refills | Status: DC
  Filled 2022-05-20: qty 90, 90d supply, fill #0
  Filled 2022-08-22: qty 90, 90d supply, fill #1

## 2022-05-27 ENCOUNTER — Ambulatory Visit (INDEPENDENT_AMBULATORY_CARE_PROVIDER_SITE_OTHER): Payer: No Typology Code available for payment source | Admitting: Medical

## 2022-05-27 ENCOUNTER — Other Ambulatory Visit (HOSPITAL_COMMUNITY): Payer: Self-pay

## 2022-05-27 VITALS — BP 131/77 | HR 72 | Temp 98.7°F | Resp 18 | Ht 69.0 in | Wt 213.8 lb

## 2022-05-27 DIAGNOSIS — E569 Vitamin deficiency, unspecified: Secondary | ICD-10-CM | POA: Diagnosis not present

## 2022-05-27 DIAGNOSIS — F32A Depression, unspecified: Secondary | ICD-10-CM

## 2022-05-27 DIAGNOSIS — F419 Anxiety disorder, unspecified: Secondary | ICD-10-CM | POA: Diagnosis not present

## 2022-05-27 DIAGNOSIS — R0683 Snoring: Secondary | ICD-10-CM | POA: Diagnosis not present

## 2022-05-27 DIAGNOSIS — E538 Deficiency of other specified B group vitamins: Secondary | ICD-10-CM

## 2022-05-27 LAB — VITAMIN B12: Vitamin B-12: 671 pg/mL (ref 211–911)

## 2022-05-27 LAB — VITAMIN D 25 HYDROXY (VIT D DEFICIENCY, FRACTURES): VITD: 26.92 ng/mL — ABNORMAL LOW (ref 30.00–100.00)

## 2022-05-27 MED ORDER — VITAMIN D (ERGOCALCIFEROL) 1.25 MG (50000 UNIT) PO CAPS
50000.0000 [IU] | ORAL_CAPSULE | ORAL | 0 refills | Status: DC
  Filled 2022-05-27: qty 8, 56d supply, fill #0

## 2022-05-27 MED ORDER — VENLAFAXINE HCL ER 37.5 MG PO CP24
37.5000 mg | ORAL_CAPSULE | Freq: Every day | ORAL | 0 refills | Status: DC
  Filled 2022-05-27: qty 30, 30d supply, fill #0

## 2022-05-27 MED ORDER — ALPRAZOLAM 0.5 MG PO TABS
0.5000 mg | ORAL_TABLET | Freq: Every day | ORAL | 0 refills | Status: DC | PRN
Start: 2022-05-27 — End: 2023-03-31
  Filled 2022-05-27: qty 16, 16d supply, fill #0

## 2022-05-27 NOTE — Addendum Note (Signed)
Addended by: Anabel Halon on: 05/27/2022 08:48 PM   Modules accepted: Orders

## 2022-05-27 NOTE — Patient Instructions (Addendum)
For fatigue will repeat b12 to see if need further superalimentation and recheck your vit D level which has been low.  Snoring/sleep apnea may be cause as well. Referred to pulmonologist for sleep apnea evaluation.  Depression and anxiety moderate well controlled but not ideal on discussion. Switching to effexor may be of benefit to mood and improve energy. DC sertraline and start effexor.  Rx advisement on meds. If any serotonin syndrome type symtoms stot trazadone and effexor. Notify me.  Follow up in one month or sooner if needed  Refilling xanax today.

## 2022-05-27 NOTE — Progress Notes (Signed)
Subjective:    Patient ID: Jerry Lopez, male    DOB: [birth year], 41 y.o.   MRN: [id number]  HPI  Pt in for follow up.   Pt had very low b12 level in past. Pt wife thinks his energy did not impove. Pt states he did join soft ball league.   Last b12 injection was in July.  Also on review hx of low vit d.  Lab done on wellness exam were normal.   Pt does snore per wife. Severeal years per wife.often times in morning does not feel well rested. Wife states has seem him wake himself up in the morning.  Pt has been on zoloft in the past. Used for depression and anxiety. Wife thinks does not work ideal. Phq-9 score today is 4.     Review of Systems  Constitutional:  Positive for fatigue. Negative for chills and fever.  HENT:         Snoring.  Respiratory:  Negative for cough, chest tightness, shortness of breath and wheezing.   Cardiovascular:  Negative for chest pain and palpitations.  Gastrointestinal:  Negative for abdominal pain, constipation and nausea.  Genitourinary:  Negative for dysuria and flank pain.  Musculoskeletal:  Negative for back pain, myalgias and neck stiffness.  Skin:  Negative for rash.  Neurological:  Negative for dizziness, light-headedness and headaches.  Hematological:  Negative for adenopathy. Does not bruise/bleed easily.  Psychiatric/Behavioral:  Positive for dysphoric mood. Negative for decreased concentration, sleep disturbance and suicidal ideas. The patient is nervous/anxious.     Past Medical History:  Diagnosis Date   Anxiety    Arthritis    Chest pain around 12-02-2014   GERD (gastroesophageal reflux disease)    Hypercholesteremia      Social History   Socioeconomic History   Marital status: Married    Spouse name: Not on file   Number of children: Not on file   Years of education: Not on file   Highest education level: Not on file  Occupational History   Not on file  Tobacco Use   Smoking status: Former    Packs/day: 0.25     Years: 5.00    Total pack years: 1.25    Types: Cigarettes    Quit date: 12/26/2015    Years since quitting: 6.4   Smokeless tobacco: Never  Vaping Use   Vaping Use: Never used  Substance and Sexual Activity   Alcohol use: Yes    Alcohol/week: 6.0 standard drinks of alcohol    Types: 6 Cans of beer per week    Comment: occasional   Drug use: No   Sexual activity: Yes    Birth control/protection: None  Other Topics Concern   Not on file  Social History Narrative   Not on file   Social Determinants of Health   Financial Resource Strain: Not on file  Food Insecurity: Not on file  Transportation Needs: Not on file  Physical Activity: Not on file  Stress: Not on file  Social Connections: Not on file  Intimate Partner Violence: Not on file    Past Surgical History:  Procedure Laterality Date   ELBOW FRACTURE SURGERY Right 07.16.14   Radial Repair   INGUINAL HERNIA REPAIR Right 01/06/2015   Procedure: LAPAROSCOPIC REPAIR OF RIGHT INGUINAL HERNIA WITH MESH;  Surgeon: Greer Pickerel, MD;  Location: [facility];  Service: General;  Laterality: Right;   INSERTION OF MESH Right 01/06/2015   Procedure: INSERTION OF MESH;  Surgeon: Greer Pickerel,  MD;  Location: [facility];  Service: General;  Laterality: Right;   LEG SURGERY Right    lower, for fracture age 81   VASECTOMY     VASECTOMY REVERSAL      Family History  Problem Relation Age of Onset   Healthy Mother        Living   Hypertension Father        Living   Non-Hodgkin's lymphoma Father    Diabetes Father    Hypertension Other        Paternal side   Heart attack Paternal Grandfather    Healthy Sister        x1   Healthy Daughter        x3    No Known Allergies  Current Outpatient Medications on File Prior to Visit  Medication Sig Dispense Refill   ALPRAZolam (XANAX) 0.5 MG tablet Take 1 tablet (0.5 mg total) by mouth daily as needed for severe anxiety or insomnia (max of 1 tablet per day). 8 tablet 0   atorvastatin (LIPITOR)  20 MG tablet Take 1 tablet by mouth daily. 90 tablet 0   busPIRone (BUSPAR) 7.5 MG tablet TAKE 1 TABLET BY MOUTH 2 TIMES DAILY 60 tablet 2   cetirizine (ZYRTEC ALLERGY) 10 MG tablet Take 1 tablet (10 mg total) by mouth at bedtime. 30 tablet 2   cyanocobalamin (,VITAMIN B-12,) 1000 MCG/ML injection Inject 1 mL (1,000 mcg total) into the muscle for 1 dose 10 mL 0   esomeprazole (NEXIUM) 20 MG capsule Take 1 capsule (20 mg total) by mouth daily at 12 noon. 90 capsule 1   fenofibrate 160 MG tablet TAKE 1 TABLET BY MOUTH ONCE DAILY 90 tablet 1   fluticasone (FLONASE) 50 MCG/ACT nasal spray Place 2 sprays into both nostrils daily. 18 mL 0   guaifenesin (HUMIBID E) 400 MG TABS tablet Take 1 tablet 3 times daily as needed for chest congestion and cough 21 tablet 0   ibuprofen (ADVIL) 400 MG tablet Take 1 tablet (400 mg total) by mouth every 8 (eight) hours as needed for up to 30 doses. 30 tablet 0   promethazine-dextromethorphan (PROMETHAZINE-DM) 6.25-15 MG/5ML syrup Take 5 mLs by mouth 4 (four) times daily as needed for cough. 180 mL 0   SYRINGE-NEEDLE, DISP, 3 ML (B-D 3CC LUER-LOK SYR 25GX5/8") 25G X 5/8" 3 ML MISC Use to inject medication once weekly for 4 weeks then once monthly for 5 months 10 each 0   traZODone (DESYREL) 50 MG tablet Take 1 tablet by mouth at bedtime. 30 tablet 11   No current facility-administered medications on file prior to visit.    BP 131/77   Pulse 72   Temp 98.7 F (37.1 C)   Resp 18   Ht '5\' 9"'$  (1.753 m)   Wt 213 lb 12.8 oz (97 kg)   SpO2 97%   BMI 31.57 kg/m        Objective:   Physical Exam  General Mental Status- Alert. General Appearance- Not in acute distress.   Skin General: Color- Normal Color. Moisture- Normal Moisture.  Neck Carotid Arteries- Normal color. Moisture- Normal Moisture. No carotid bruits. No JVD.  Chest and Lung Exam Auscultation: Breath Sounds:-Normal.  Cardiovascular Auscultation:Rythm- Regular. Murmurs & Other Heart  Sounds:Auscultation of the heart reveals- No Murmurs.  Abdomen Inspection:-Inspeection Normal. Palpation/Percussion:Note:No mass. Palpation and Percussion of the abdomen reveal- Non Tender, Non Distended + BS, no rebound or guarding.    Neurologic Cranial Nerve exam:- CN III-XII  intact(No nystagmus), symmetric smile. Strength:- 5/5 equal and symmetric strength both upper and lower extremities.       Assessment & Plan:   Patient Instructions  For fatigue will repeat b12 to see if need further superalimentation and recheck your vit D level which has been low.  Snoring/sleep apnea may be cause as well. Referred to pulmonologist for sleep apnea evaluation.  Depression and anxiety moderate well controlled but not ideal on discussion. Switching to effexor may be of benefit to mood and improve energy. DC sertraline and start effexor.  Rx advisement on meds. If any serotonin syndrome type symtoms stot trazadone and effexor. Notify me.  Follow up in one month or sooner if needed   [employer], PA-C

## 2022-05-27 NOTE — Addendum Note (Signed)
Addended by: Anabel Halon on: 05/27/2022 11:22 AM   Modules accepted: Orders

## 2022-05-28 ENCOUNTER — Other Ambulatory Visit (HOSPITAL_COMMUNITY): Payer: Self-pay

## 2022-06-15 ENCOUNTER — Other Ambulatory Visit: Payer: Self-pay | Admitting: Medical

## 2022-06-15 ENCOUNTER — Other Ambulatory Visit (HOSPITAL_COMMUNITY): Payer: Self-pay

## 2022-06-15 DIAGNOSIS — E785 Hyperlipidemia, unspecified: Secondary | ICD-10-CM

## 2022-06-16 ENCOUNTER — Other Ambulatory Visit (HOSPITAL_COMMUNITY): Payer: Self-pay

## 2022-06-16 MED ORDER — ATORVASTATIN CALCIUM 20 MG PO TABS
20.0000 mg | ORAL_TABLET | Freq: Every day | ORAL | 0 refills | Status: DC
  Filled 2022-06-16: qty 90, 90d supply, fill #0

## 2022-06-17 ENCOUNTER — Encounter: Payer: Self-pay | Admitting: Adult Health

## 2022-06-17 ENCOUNTER — Ambulatory Visit (INDEPENDENT_AMBULATORY_CARE_PROVIDER_SITE_OTHER): Payer: No Typology Code available for payment source | Admitting: Adult Health

## 2022-06-17 VITALS — BP 116/82 | HR 62 | Ht 69.0 in | Wt 216.6 lb

## 2022-06-17 DIAGNOSIS — R0683 Snoring: Secondary | ICD-10-CM

## 2022-06-17 DIAGNOSIS — E669 Obesity, unspecified: Secondary | ICD-10-CM

## 2022-06-17 NOTE — Assessment & Plan Note (Addendum)
Snoring, daytime sleepiness, restless sleep, gasping for air during sleep, BMI 31 all concerning for possible underlying sleep apnea.  We will set patient up for home sleep study.  Patient education on sleep apnea given.  - discussed how weight can impact sleep and risk for sleep disordered breathing - discussed options to assist with weight loss: combination of diet modification, cardiovascular and strength training exercises   - had an extensive discussion regarding the adverse health consequences related to untreated sleep disordered breathing - specifically discussed the risks for hypertension, coronary artery disease, cardiac dysrhythmias, cerebrovascular disease, and diabetes - lifestyle modification discussed   - discussed how sleep disruption can increase risk of accidents, particularly when driving - safe driving practices were discussed   Plan  Patient Instructions  Set up for home sleep study Healthy sleep regimen Do not drive if sleepy Use caution with sedating medications Follow-up in 6 weeks to discuss sleep study results and treatment plan

## 2022-06-17 NOTE — Progress Notes (Addendum)
$'@Patient'C$  ID: Jerry Lopez, male    DOB: 12-06-1980, 41 y.o.   MRN: [id number]  Chief Complaint  Patient presents with   Consult    Referring provider: Elise Benne  HPI: 41 year old male seen for sleep consult June 17, 2022 for restless sleep, snoring, gasping for air during sleep and daytime sleepiness  TEST/EVENTS :   06/17/22 Sleep consult  Patient presents for sleep consult today.  Kindly referred by primary care provider Mackie Pai PA.  Patient complains that he never feels rested.  He wakes up feeling unrefreshed.  Feels tired throughout the day.  Has very loud snoring.  Is disruptive to spouse's sleep.  Patient says he wakes herself up snoring or gasping for breath. Typically goes to bed about 10 PM.  Takes about an hour to go to sleep.  Is up 2 times at night.  Is up at 7 AM.   Weight is up about 8 pounds over the last 2 years.  Current weight is at 216 pounds with a BMI at 31.  Patient is never had a sleep study before.  No symptoms suspicious for cataplexy or sleep paralysis.  Caffeine intake 5 soda a day.  Has insomnia and anxiety . Takes Trazodone '50mg'$  At bedtime  . Takes Buspar 7.'5mg'$  Twice daily .  Takes Xanax on occasion. Recently changed from zoloft to effexor . Does feel some better in am.  Active around the house. Plays softball.  No history of CHF or CVA . No removable  Epworth 2 out of 24, gets sleepy when inactive.   SH: Works in [employer].  Smokes half a pack of cigarettes a day.  Social no alcohol Is married.  Has 4 children ages 69-25.  Family history positive for non-Hodgkin's lymphoma in his father  Surgical history : Eye surgery, inguinal hernia repair  Past medical anxiety, depression, insomnia, hyperlipidemia, chronic rhinitis, GERD   No Known Allergies  Immunization History  Administered Date(s) Administered   Influenza,inj,Quad PF,6+ Mos 10/30/2016   Tdap 11/14/2013, 04/08/2019    Past Medical History:  Diagnosis Date   Anxiety     Arthritis    Chest pain around 12-02-2014   GERD (gastroesophageal reflux disease)    Hypercholesteremia     Tobacco History: Social History   Tobacco Use  Smoking Status Some Days   Packs/day: 0.50   Years: 5.00   Total pack years: 2.50   Types: Cigarettes   Last attempt to quit: 12/26/2015   Years since quitting: 6.7  Smokeless Tobacco Never  Tobacco Comments   Stopped smoking again on 10/04/22 wearing patches 14 mg, hfb   Ready to quit: No Counseling given: Yes Tobacco comments: Stopped smoking again on 10/04/22 wearing patches 14 mg, hfb   Outpatient Medications Prior to Visit  Medication Sig Dispense Refill   ALPRAZolam (XANAX) 0.5 MG tablet Take 1 tablet (0.5 mg total) by mouth daily as needed for severe anxiety or insomnia (max of 1 tablet per day). 16 tablet 0   cyanocobalamin (,VITAMIN B-12,) 1000 MCG/ML injection Inject 1 mL (1,000 mcg total) into the muscle for 1 dose 10 mL 0   esomeprazole (NEXIUM) 20 MG capsule Take 1 capsule (20 mg total) by mouth daily at 12 noon. 90 capsule 1   fenofibrate 160 MG tablet TAKE 1 TABLET BY MOUTH ONCE DAILY 90 tablet 1   atorvastatin (LIPITOR) 20 MG tablet Take 1 tablet by mouth daily. 90 tablet 0   busPIRone (BUSPAR) 7.5 MG tablet TAKE  1 TABLET BY MOUTH 2 TIMES DAILY 60 tablet 2   cetirizine (ZYRTEC ALLERGY) 10 MG tablet Take 1 tablet (10 mg total) by mouth at bedtime. 30 tablet 2   fluticasone (FLONASE) 50 MCG/ACT nasal spray Place 2 sprays into both nostrils daily. 18 mL 0   guaifenesin (HUMIBID E) 400 MG TABS tablet Take 1 tablet 3 times daily as needed for chest congestion and cough 21 tablet 0   ibuprofen (ADVIL) 400 MG tablet Take 1 tablet (400 mg total) by mouth every 8 (eight) hours as needed for up to 30 doses. 30 tablet 0   promethazine-dextromethorphan (PROMETHAZINE-DM) 6.25-15 MG/5ML syrup Take 5 mLs by mouth 4 (four) times daily as needed for cough. 180 mL 0   SYRINGE-NEEDLE, DISP, 3 ML (B-D 3CC LUER-LOK SYR 25GX5/8")  25G X 5/8" 3 ML MISC Use to inject medication once weekly for 4 weeks then once monthly for 5 months (Patient not taking: Reported on 08/19/2022) 10 each 0   traZODone (DESYREL) 50 MG tablet Take 1 tablet by mouth at bedtime. 30 tablet 11   venlafaxine XR (EFFEXOR XR) 37.5 MG 24 hr capsule Take 1 capsule (37.5 mg total) by mouth daily with breakfast. 30 capsule 0   Vitamin D, Ergocalciferol, (DRISDOL) 1.25 MG (50000 UNIT) CAPS capsule Take 1 capsule (50,000 Units total) by mouth every 7 (seven) days. (Patient not taking: Reported on 10/07/2022) 8 capsule 0   No facility-administered medications prior to visit.     Review of Systems:   Constitutional:   No  weight loss, night sweats,  Fevers, chills,  +fatigue, or  lassitude.  HEENT:   No headaches,  Difficulty swallowing,  Tooth/dental problems, or  Sore throat,                No sneezing, itching, ear ache, nasal congestion, post nasal drip,   CV:  No chest pain,  Orthopnea, PND, swelling in lower extremities, anasarca, dizziness, palpitations, syncope.   GI  No heartburn, indigestion, abdominal pain, nausea, vomiting, diarrhea, change in bowel habits, loss of appetite, bloody stools.   Resp: No shortness of breath with exertion or at rest.  No excess mucus, no productive cough,  No non-productive cough,  No coughing up of blood.  No change in color of mucus.  No wheezing.  No chest wall deformity  Skin: no rash or lesions.  GU: no dysuria, change in color of urine, no urgency or frequency.  No flank pain, no hematuria   MS:  No joint pain or swelling.  No decreased range of motion.  No back pain.    Physical Exam  BP 116/82 (BP Location: Left Arm, Cuff Size: Normal)   Pulse 62   Ht '5\' 9"'$  (1.753 m)   Wt 216 lb 9.6 oz (98.2 kg)   SpO2 98%   BMI 31.99 kg/m   GEN: A/Ox3; pleasant , NAD, well nourished    HEENT:  [place]/AT,  , NOSE-clear, THROAT-clear, no lesions, no postnasal drip or exudate noted.  Class II MP  NECK:  Supple  w/ fair ROM; no JVD; normal carotid impulses w/o bruits; no thyromegaly or nodules palpated; no lymphadenopathy.    RESP  Clear  P & A; w/o, wheezes/ rales/ or rhonchi. no accessory muscle use, no dullness to percussion  CARD:  RRR, no m/r/g, no peripheral edema, pulses intact, no cyanosis or clubbing.  GI:   Soft & nt; nml bowel sounds; no organomegaly or masses detected.   Musco: Warm bil,  no deformities or joint swelling noted.   Neuro: alert, no focal deficits noted.    Skin: Warm, no lesions or rashes    Lab Results:  CBC    Component Value Date/Time   WBC 5.6 10/15/2021 0928   RBC 5.14 10/15/2021 0928   HGB 15.0 10/15/2021 0928   HCT 44.2 10/15/2021 0928   PLT 174.0 10/15/2021 0928   MCV 85.9 10/15/2021 0928   MCH 30.2 12/31/2014 1455   MCHC 34.0 10/15/2021 0928   RDW 13.1 10/15/2021 0928   LYMPHSABS 1.8 10/15/2021 0928   MONOABS 0.5 10/15/2021 0928   EOSABS 0.2 10/15/2021 0928   BASOSABS 0.0 10/15/2021 0928    BMET    Component Value Date/Time   NA 139 10/15/2021 0928   K 4.8 10/15/2021 0928   CL 103 10/15/2021 0928   CO2 31 10/15/2021 0928   GLUCOSE 100 (H) 10/15/2021 0928   BUN 24 (H) 10/15/2021 0928   CREATININE 1.07 10/15/2021 0928   CREATININE 1.20 05/23/2014 0815   CALCIUM 9.7 10/15/2021 0928   GFRNONAA 80 (L) 12/31/2014 1455   GFRAA >90 12/31/2014 1455    BNP No results found for: "BNP"  ProBNP No results found for: "PROBNP"  Imaging: No results found.        No data to display          No results found for: "NITRICOXIDE"      Assessment & Plan:   Snoring Snoring, daytime sleepiness, restless sleep, gasping for air during sleep, BMI 31 all concerning for possible underlying sleep apnea.  We will set patient up for home sleep study.  Patient education on sleep apnea given.  - discussed how weight can impact sleep and risk for sleep disordered breathing - discussed options to assist with weight loss: combination of diet  modification, cardiovascular and strength training exercises   - had an extensive discussion regarding the adverse health consequences related to untreated sleep disordered breathing - specifically discussed the risks for hypertension, coronary artery disease, cardiac dysrhythmias, cerebrovascular disease, and diabetes - lifestyle modification discussed   - discussed how sleep disruption can increase risk of accidents, particularly when driving - safe driving practices were discussed   Plan  Patient Instructions  Set up for home sleep study Healthy sleep regimen Do not drive if sleepy Use caution with sedating medications Follow-up in 6 weeks to discuss sleep study results and treatment plan    Obesity (BMI 30.0-34.9) Healthy weight loss discussed     Rexene Edison, NP 06/17/22

## 2022-06-17 NOTE — Assessment & Plan Note (Signed)
Healthy weight loss discussed 

## 2022-06-17 NOTE — Patient Instructions (Addendum)
Set up for home sleep study Healthy sleep regimen Do not drive if sleepy Use caution with sedating medications Follow-up in 6 weeks to discuss sleep study results and treatment plan

## 2022-06-18 ENCOUNTER — Other Ambulatory Visit (HOSPITAL_COMMUNITY): Payer: Self-pay

## 2022-06-20 NOTE — Progress Notes (Signed)
Reviewed and agree with assessment/plan.   Chesley Mires, MD [facility] Pulmonary/Critical Care 06/20/2022, 4:13 PM Pager:  [phone]

## 2022-06-21 ENCOUNTER — Other Ambulatory Visit (HOSPITAL_COMMUNITY): Payer: Self-pay

## 2022-06-21 ENCOUNTER — Other Ambulatory Visit: Payer: Self-pay | Admitting: Medical

## 2022-06-21 MED ORDER — VENLAFAXINE HCL ER 37.5 MG PO CP24
37.5000 mg | ORAL_CAPSULE | Freq: Every day | ORAL | 0 refills | Status: DC
  Filled 2022-06-21: qty 30, 30d supply, fill #0

## 2022-07-15 ENCOUNTER — Other Ambulatory Visit: Payer: Self-pay | Admitting: Medical

## 2022-07-15 DIAGNOSIS — F5101 Primary insomnia: Secondary | ICD-10-CM

## 2022-07-16 ENCOUNTER — Other Ambulatory Visit (HOSPITAL_COMMUNITY): Payer: Self-pay

## 2022-07-16 MED ORDER — TRAZODONE HCL 50 MG PO TABS
50.0000 mg | ORAL_TABLET | Freq: Every day | ORAL | 11 refills | Status: DC
  Filled 2022-07-16: qty 30, 30d supply, fill #0
  Filled 2022-08-15: qty 30, 30d supply, fill #1
  Filled 2022-09-22: qty 30, 30d supply, fill #2
  Filled 2022-10-20: qty 30, 30d supply, fill #3
  Filled 2022-11-21: qty 30, 30d supply, fill #4
  Filled 2022-12-18: qty 30, 30d supply, fill #5
  Filled 2023-01-18: qty 30, 30d supply, fill #6
  Filled 2023-02-19: qty 30, 30d supply, fill #7
  Filled 2023-03-23: qty 30, 30d supply, fill #8
  Filled 2023-04-23 – 2023-04-29 (×2): qty 30, 30d supply, fill #9
  Filled 2023-05-27: qty 30, 30d supply, fill #10
  Filled 2023-06-27: qty 30, 30d supply, fill #11

## 2022-07-20 ENCOUNTER — Ambulatory Visit (INDEPENDENT_AMBULATORY_CARE_PROVIDER_SITE_OTHER): Payer: No Typology Code available for payment source | Admitting: Gastroenterology

## 2022-07-20 ENCOUNTER — Encounter: Payer: Self-pay | Admitting: Gastroenterology

## 2022-07-20 VITALS — BP 120/78 | HR 79 | Ht 69.0 in | Wt 215.0 lb

## 2022-07-20 DIAGNOSIS — R1310 Dysphagia, unspecified: Secondary | ICD-10-CM

## 2022-07-20 DIAGNOSIS — K2100 Gastro-esophageal reflux disease with esophagitis, without bleeding: Secondary | ICD-10-CM | POA: Diagnosis not present

## 2022-07-20 NOTE — Patient Instructions (Signed)
You have been scheduled for an endoscopy. Please follow written instructions given to you at your visit today. If you use inhalers (even only as needed), please bring them with you on the day of your procedure.  The [facility] providers would like to encourage you to use MYCHART to communicate with providers for non-urgent requests or questions.  Due to long hold times on the telephone, sending your provider a message by MYCHART may be a faster and more efficient way to get a response.  Please allow 48 business hours for a response.  Please remember that this is for non-urgent requests.   Due to recent changes in healthcare laws, you may see the results of your imaging and laboratory studies on MyChart before your provider has had a chance to review them.  We understand that in some cases there may be results that are confusing or concerning to you. Not all laboratory results come back in the same time frame and the provider may be waiting for multiple results in order to interpret others.  Please give us 48 hours in order for your provider to thoroughly review all the results before contacting the office for clarification of your results.   Thank you for choosing me and [facility].  Malcolm T. Stark, Jr., MD., FACG  

## 2022-07-20 NOTE — Progress Notes (Signed)
Assessment    GERD with history of LA class A esophagitis with new solid food dysphagia, R/O esophageal stricture, esophagitis, Barrett's Intermittent coughing and choking when swallowing, R/O oropharyngeal dysphagia CRC screening, average risk  Recommendations   Continue Nexium 40 mg daily and closely follow antireflux measures Schedule EGD with possible dilation. The risks (including bleeding, perforation, infection, missed lesions, medication reactions and possible hospitalization or surgery if complications occur), benefits, and alternatives to endoscopy with possible biopsy and possible dilation were discussed with the patient and they consent to proceed.   If oropharyngeal dysphagia symptoms persist consider BA esophagram, MBSS Recommend colonoscopy at age 60    HPI   Chief complaint: Dysphagia  Patient profile:  Jerry ROZO is a 41 y.o. male referred by Mackie Pai, PA-C for GERD and dysphagia.  Previously evaluated in 2009 for worsening reflux symptoms with EGD performed as below.  He relates his reflux symptoms remain under good control on Nexium 40 mg daily.  He has intermittent difficulties swallowing solid foods for the past year.  He also notes occasions when swallowing saliva or liquids that he experiences choking and coughing. Denies weight loss, abdominal pain, constipation, diarrhea, change in stool caliber, melena, hematochezia, nausea, vomiting, chest pain.    Previous Labs / Imaging::    Latest Ref Rng & Units 10/15/2021    9:28 AM 07/21/2021    9:28 AM 10/01/2020   10:41 AM  CBC  WBC 4.0 - 10.5 K/uL 5.6  4.5  6.1   Hemoglobin 13.0 - 17.0 g/dL 15.0  14.3  15.5   Hematocrit 39.0 - 52.0 % 44.2  41.7  45.6   Platelets 150.0 - 400.0 K/uL 174.0  155.0  188.0     No results found for: "LIPASE"    Latest Ref Rng & Units 10/15/2021    9:28 AM 07/21/2021    9:28 AM 10/01/2020   10:41 AM  CMP  Glucose 70 - 99 mg/dL 100  100  92   BUN 6 - 23 mg/dL '24   25  22   '$ Creatinine 0.40 - 1.50 mg/dL 1.07  1.13  1.34   Sodium 135 - 145 mEq/L 139  139  138   Potassium 3.5 - 5.1 mEq/L 4.8  4.1  4.7   Chloride 96 - 112 mEq/L 103  103  101   CO2 19 - 32 mEq/L 31  28  32   Calcium 8.4 - 10.5 mg/dL 9.7  9.3  10.0   Total Protein 6.0 - 8.3 g/dL 7.1  7.2  7.5   Total Bilirubin 0.2 - 1.2 mg/dL 0.6  0.8  0.8   Alkaline Phos 39 - 117 U/L 45  47  53   AST 0 - 37 U/L '14  15  15   '$ ALT 0 - 53 U/L '19  20  19      '$ Previous GI evaluation    Endoscopies:  EGD 09/2008 LA class A esophagitis, small hiatal hernia  Imaging:  CT RENAL STONE STUDY CLINICAL DATA:  Left-sided flank pain for the past several months. Evaluate for nephrolithiasis.  EXAM: CT ABDOMEN AND PELVIS WITHOUT CONTRAST  TECHNIQUE: Multidetector CT imaging of the abdomen and pelvis was performed following the standard protocol without IV contrast.  COMPARISON:  CT abdomen pelvis-10/10/2014  FINDINGS: The lack of intravenous contrast limits the ability to evaluate solid abdominal organs.  Lower chest: Limited visualization of the lower thorax is negative for focal airspace opacity or pleural effusion.  Normal heart size.  No pericardial effusion.  Hepatobiliary: There is diffuse decreased attenuation hepatic parenchyma suggestive of hepatic steatosis. Normal hepatic contour. Normal appearance of the gallbladder given degree distention. No radiopaque gallstones. No ascites.  Pancreas: Normal noncontrast appearance of the pancreas.  Spleen: Normal noncontrast appearance of the spleen.  Adrenals/Urinary Tract: Normal noncontrast appearance of the bilateral kidneys. No renal stones. No renal stones are seen along the expected course of either ureter or the urinary bladder. Normal noncontrast appearance of the urinary bladder given degree distention. No urinary obstruction or perinephric stranding.  Normal noncontrast appearance the bilateral adrenal glands.  Stomach/Bowel:  Moderate colonic stool burden without evidence of enteric obstruction. Normal noncontrast appearance of the terminal ileum and the retrocecal appendix. No pneumoperitoneum, pneumatosis or portal venous gas.  Vascular/Lymphatic: Normal caliber of the abdominal aorta.  No bulky retroperitoneal, mesenteric, pelvic or inguinal lymphadenopathy on this noncontrast examination.  Reproductive: Normal noncontrast appearance of the pelvic organs. Punctate phleboliths seen within left hemipelvis. No free fluid in the pelvic cul-de-sac.  Other: Tiny mesenteric fat containing periumbilical hernia.  Musculoskeletal: No acute or aggressive osseous abnormalities.  IMPRESSION: 1. No explanation for patient's persistent left-sided flank pain. Specifically, no evidence of nephrolithiasis, urinary or enteric obstruction. Normal noncontrast appearance of the appendix. 2. Suspected hepatic steatosis.  Correlation with LFTs is advised.  Electronically Signed   By: Sandi Mariscal M.D.   On: 11/08/2018 09:12    Past Medical History:  Diagnosis Date   Anxiety    Arthritis    Chest pain around 12-02-2014   GERD (gastroesophageal reflux disease)    Hypercholesteremia    Past Surgical History:  Procedure Laterality Date   ELBOW FRACTURE SURGERY Right 07.16.14   Radial Repair   INGUINAL HERNIA REPAIR Right 01/06/2015   Procedure: LAPAROSCOPIC REPAIR OF RIGHT INGUINAL HERNIA WITH MESH;  Surgeon: Greer Pickerel, MD;  Location: [facility];  Service: General;  Laterality: Right;   INSERTION OF MESH Right 01/06/2015   Procedure: INSERTION OF MESH;  Surgeon: Greer Pickerel, MD;  Location: [facility];  Service: General;  Laterality: Right;   LEG SURGERY Right    lower, for fracture age 27   VASECTOMY     VASECTOMY REVERSAL     Family History  Problem Relation Age of Onset   Healthy Mother        Living   Hypertension Father        Living   Non-Hodgkin's lymphoma Father    Diabetes Father    Hypertension Other         Paternal side   Heart attack Paternal Grandfather    Healthy Sister        x1   Healthy Daughter        x3   Social History   Tobacco Use   Smoking status: Some Days    Packs/day: 0.25    Years: 5.00    Total pack years: 1.25    Types: Cigarettes    Last attempt to quit: 12/26/2015    Years since quitting: 6.5   Smokeless tobacco: Never   Tobacco comments:    1/2 ppd 06/17/22  Vaping Use   Vaping Use: Never used  Substance Use Topics   Alcohol use: Yes    Alcohol/week: 6.0 standard drinks of alcohol    Types: 6 Cans of beer per week    Comment: occasional   Drug use: No   Current Outpatient Medications  Medication Sig Dispense Refill   ALPRAZolam (  XANAX) 0.5 MG tablet Take 1 tablet (0.5 mg total) by mouth daily as needed for severe anxiety or insomnia (max of 1 tablet per day). 16 tablet 0   atorvastatin (LIPITOR) 20 MG tablet Take 1 tablet by mouth daily. 90 tablet 0   busPIRone (BUSPAR) 7.5 MG tablet TAKE 1 TABLET BY MOUTH 2 TIMES DAILY 60 tablet 2   esomeprazole (NEXIUM) 20 MG capsule Take 1 capsule (20 mg total) by mouth daily at 12 noon. 90 capsule 1   fenofibrate 160 MG tablet TAKE 1 TABLET BY MOUTH ONCE DAILY 90 tablet 1   SYRINGE-NEEDLE, DISP, 3 ML (B-D 3CC LUER-LOK SYR 25GX5/8") 25G X 5/8" 3 ML MISC Use to inject medication once weekly for 4 weeks then once monthly for 5 months 10 each 0   traZODone (DESYREL) 50 MG tablet Take 1 tablet by mouth at bedtime. 30 tablet 11   venlafaxine XR (EFFEXOR XR) 37.5 MG 24 hr capsule Take 1 capsule (37.5 mg total) by mouth daily with breakfast. 30 capsule 0   Vitamin D, Ergocalciferol, (DRISDOL) 1.25 MG (50000 UNIT) CAPS capsule Take 1 capsule (50,000 Units total) by mouth every 7 (seven) days. 8 capsule 0   No current facility-administered medications for this visit.   No Known Allergies  Review of Systems: All other systems reviewed and negative except where noted in HPI.    Physical Exam    Wt Readings from Last 3  Encounters:  06/17/22 216 lb 9.6 oz (98.2 kg)  05/27/22 213 lb 12.8 oz (97 kg)  10/15/21 216 lb (98 kg)    There were no vitals taken for this visit. Constitutional:  Generally well appearing male in no acute distress. Psychiatric: Pleasant. Normal mood and affect. Behavior is normal. HEENT: Pupils normal.  Conjunctivae are normal. No scleral icterus. Neck supple.  Cardiovascular: Normal rate, regular rhythm. No edema Pulmonary/chest: Effort normal and breath sounds normal. No wheezing, rales or rhonchi. Abdominal: Soft, nondistended, nontender. Bowel sounds active throughout. There are no masses palpable. No hepatomegaly. Rectal: Not done Neurological: Alert and oriented to person place and time. Skin: Skin is warm and dry. No rashes noted.  Lucio Edward, MD   cc:  Referring Provider Saguier, Percell Miller, PA-C

## 2022-07-21 ENCOUNTER — Other Ambulatory Visit: Payer: Self-pay | Admitting: Medical

## 2022-07-21 ENCOUNTER — Other Ambulatory Visit (HOSPITAL_COMMUNITY): Payer: Self-pay

## 2022-07-21 MED ORDER — BUSPIRONE HCL 7.5 MG PO TABS
ORAL_TABLET | Freq: Two times a day (BID) | ORAL | 2 refills | Status: DC
  Filled 2022-07-21: qty 60, 30d supply, fill #0
  Filled 2022-08-22: qty 60, 30d supply, fill #1
  Filled 2022-09-22: qty 60, 30d supply, fill #2

## 2022-07-22 ENCOUNTER — Other Ambulatory Visit (HOSPITAL_COMMUNITY): Payer: Self-pay

## 2022-07-23 ENCOUNTER — Other Ambulatory Visit (INDEPENDENT_AMBULATORY_CARE_PROVIDER_SITE_OTHER): Payer: No Typology Code available for payment source

## 2022-07-23 DIAGNOSIS — E569 Vitamin deficiency, unspecified: Secondary | ICD-10-CM

## 2022-07-23 LAB — VITAMIN D 25 HYDROXY (VIT D DEFICIENCY, FRACTURES): VITD: 43.06 ng/mL (ref 30.00–100.00)

## 2022-07-25 ENCOUNTER — Other Ambulatory Visit: Payer: Self-pay | Admitting: Medical

## 2022-07-26 ENCOUNTER — Other Ambulatory Visit (HOSPITAL_COMMUNITY): Payer: Self-pay

## 2022-07-26 MED ORDER — VENLAFAXINE HCL ER 37.5 MG PO CP24
37.5000 mg | ORAL_CAPSULE | Freq: Every day | ORAL | 0 refills | Status: DC
  Filled 2022-07-26: qty 30, 30d supply, fill #0

## 2022-08-14 ENCOUNTER — Encounter: Payer: Self-pay | Admitting: Certified Registered Nurse Anesthetist

## 2022-08-16 ENCOUNTER — Other Ambulatory Visit (HOSPITAL_COMMUNITY): Payer: Self-pay

## 2022-08-19 ENCOUNTER — Encounter: Payer: Self-pay | Admitting: Gastroenterology

## 2022-08-19 ENCOUNTER — Ambulatory Visit (AMBULATORY_SURGERY_CENTER): Payer: No Typology Code available for payment source | Admitting: Gastroenterology

## 2022-08-19 VITALS — BP 118/74 | HR 66 | Temp 98.0°F | Resp 13 | Ht 69.0 in | Wt 215.0 lb

## 2022-08-19 DIAGNOSIS — R1310 Dysphagia, unspecified: Secondary | ICD-10-CM

## 2022-08-19 DIAGNOSIS — K317 Polyp of stomach and duodenum: Secondary | ICD-10-CM | POA: Diagnosis not present

## 2022-08-19 DIAGNOSIS — K2100 Gastro-esophageal reflux disease with esophagitis, without bleeding: Secondary | ICD-10-CM

## 2022-08-19 MED ORDER — SODIUM CHLORIDE 0.9 % IV SOLN: 500.0000 mL | Freq: Once | INTRAVENOUS | Status: DC

## 2022-08-19 NOTE — Progress Notes (Signed)
Called to room to assist during endoscopic procedure.  Patient ID and intended procedure confirmed with present staff. Received instructions for my participation in the procedure from the performing physician.  

## 2022-08-19 NOTE — Patient Instructions (Signed)
Please read handouts provided. Continue present medications. Dilation Diet. Await pathology results. Contact Dr. Fuller Plan if dysphagia persists.   YOU HAD AN ENDOSCOPIC PROCEDURE TODAY AT [facility]:   Refer to the procedure report that was given to you for any specific questions about what was found during the examination.  If the procedure report does not answer your questions, please call your gastroenterologist to clarify.  If you requested that your care partner not be given the details of your procedure findings, then the procedure report has been included in a sealed envelope for you to review at your convenience later.  YOU SHOULD EXPECT: Some feelings of bloating in the abdomen. Passage of more gas than usual.  Walking can help get rid of the air that was put into your GI tract during the procedure and reduce the bloating. If you had a lower endoscopy (such as a colonoscopy or flexible sigmoidoscopy) you may notice spotting of blood in your stool or on the toilet paper. If you underwent a bowel prep for your procedure, you may not have a normal bowel movement for a few days.  Please Note:  You might notice some irritation and congestion in your nose or some drainage.  This is from the oxygen used during your procedure.  There is no need for concern and it should clear up in a day or so.  SYMPTOMS TO REPORT IMMEDIATELY:   Following upper endoscopy (EGD)  Vomiting of blood or coffee ground material  New chest pain or pain under the shoulder blades  Painful or persistently difficult swallowing  New shortness of breath  Fever of 100F or higher  Black, tarry-looking stools  For urgent or emergent issues, a gastroenterologist can be reached at any hour by calling [phone]. Do not use MyChart messaging for urgent concerns.    DIET:  Drink plenty of fluids but you should avoid alcoholic beverages for 24 hours.  ACTIVITY:  You should plan to take it easy for the  rest of today and you should NOT DRIVE or use heavy machinery until tomorrow (because of the sedation medicines used during the test).    FOLLOW UP: Our staff will call the number listed on your records the next business day following your procedure.  We will call around 7:15- 8:00 am to check on you and address any questions or concerns that you may have regarding the information given to you following your procedure. If we do not reach you, we will leave a message.     If any biopsies were taken you will be contacted by phone or by letter within the next 1-3 weeks.  Please call us at [phone] if you have not heard about the biopsies in 3 weeks.    SIGNATURES/CONFIDENTIALITY: You and/or your care partner have signed paperwork which will be entered into your electronic medical record.  These signatures attest to the fact that that the information above on your After Visit Summary has been reviewed and is understood.  Full responsibility of the confidentiality of this discharge information lies with you and/or your care-partner.

## 2022-08-19 NOTE — Progress Notes (Signed)
0759 Robinul 0.1 mg IV given due large amount of secretions upon assessment.  MD made aware, vss 

## 2022-08-19 NOTE — Progress Notes (Signed)
Report given to PACU, vss 

## 2022-08-19 NOTE — Progress Notes (Signed)
See 07/20/2022 H&P, no changes

## 2022-08-19 NOTE — Op Note (Signed)
[name] Patient Name: Jerry Lopez Procedure Date: 08/19/2022 7:59 AM MRN: [id number] Endoscopist: Ladene Artist , MD, [id number] Age: 41 Referring MD:  Date of Birth: 05-Oct-1981 Gender: Male Account #: [id number] Procedure:                Upper GI endoscopy Indications:              Dysphagia, Gastroesophageal reflux disease Medicines:                Monitored Anesthesia Care Procedure:                Pre-Anesthesia Assessment:                           - Prior to the procedure, a History and Physical                            was performed, and patient medications and                            allergies were reviewed. The patient's tolerance of                            previous anesthesia was also reviewed. The risks                            and benefits of the procedure and the sedation                            options and risks were discussed with the patient.                            All questions were answered, and informed consent                            was obtained. Prior Anticoagulants: The patient has                            taken no anticoagulant or antiplatelet agents. ASA                            Grade Assessment: II - A patient with mild systemic                            disease. After reviewing the risks and benefits,                            the patient was deemed in satisfactory condition to                            undergo the procedure.                           After obtaining informed consent, the endoscope was  passed under direct vision. Throughout the                            procedure, the patient's blood pressure, pulse, and                            oxygen saturations were monitored continuously. The                            Endoscope was introduced through the mouth, and                            advanced to the second part of duodenum. The upper                            GI  endoscopy was accomplished without difficulty.                            The patient tolerated the procedure well. Scope In: Scope Out: Findings:                 No endoscopic abnormality was evident in the                            esophagus to explain the patient's complaint of                            dysphagia. It was decided, however, to proceed with                            dilation of the entire esophagus. A guidewire was                            placed and the scope was withdrawn. Dilation was                            performed with a Savary dilator with no resistance                            at 17 mm.                           Multiple 4 to 8 mm sessile polyps c/w benign fundic                            gland polyps with no bleeding and no stigmata of                            recent bleeding were found in the gastric fundus                            and in the gastric body. Biopsies were taken with a  cold forceps for histology.                           A small hiatal hernia was present.                           The exam of the stomach was otherwise normal.                           The duodenal bulb and second portion of the                            duodenum were normal. Complications:            No immediate complications. Estimated Blood Loss:     Estimated blood loss was minimal. Impression:               - No endoscopic esophageal abnormality to explain                            patient's dysphagia. Esophagus dilated. .                           - Multiple gastric polyps. Biopsied.                           - Small hiatal hernia.                           - Normal duodenal bulb and second portion of the                            duodenum. Recommendation:           - Patient has a contact number available for                            emergencies. The signs and symptoms of potential                            delayed  complications were discussed with the                            patient. Return to normal activities tomorrow.                            Written discharge instructions were provided to the                            patient.                           - Clear liquid diet for 2 hours, then advance as                            tolerated to soft diet today.                           -  Resume prior diet tomorrow.                           - Follow antireflux measures.                           - Continue present medications.                           - Await pathology results.                           - Contant [place] if dysphagia persists. Ladene Artist, MD 08/19/2022 8:13:25 AM This report has been signed electronically.

## 2022-08-20 ENCOUNTER — Telehealth: Payer: Self-pay | Admitting: *Deleted

## 2022-08-20 NOTE — Telephone Encounter (Signed)
  Follow up Call-     08/19/2022    7:26 AM  Call back number  Post procedure Call Back phone  # [phone]  Permission to leave phone message Yes     Patient questions:  Do you have a fever, pain , or abdominal swelling? No. Pain Score  0 *  Have you tolerated food without any problems? Yes.    Have you been able to return to your normal activities? Yes.    Do you have any questions about your discharge instructions: Diet   No. Medications  No. Follow up visit  No.  Do you have questions or concerns about your Care? No.  Actions: * If pain score is 4 or above: No action needed, pain <4.

## 2022-08-23 ENCOUNTER — Other Ambulatory Visit (HOSPITAL_COMMUNITY): Payer: Self-pay

## 2022-08-23 ENCOUNTER — Other Ambulatory Visit: Payer: Self-pay | Admitting: Medical

## 2022-08-23 MED ORDER — VENLAFAXINE HCL ER 37.5 MG PO CP24
37.5000 mg | ORAL_CAPSULE | Freq: Every day | ORAL | 0 refills | Status: DC
  Filled 2022-08-23: qty 30, 30d supply, fill #0

## 2022-08-24 ENCOUNTER — Other Ambulatory Visit (HOSPITAL_COMMUNITY): Payer: Self-pay

## 2022-09-02 ENCOUNTER — Encounter: Payer: Self-pay | Admitting: Gastroenterology

## 2022-09-15 ENCOUNTER — Other Ambulatory Visit: Payer: Self-pay | Admitting: Medical

## 2022-09-15 DIAGNOSIS — E785 Hyperlipidemia, unspecified: Secondary | ICD-10-CM

## 2022-09-18 ENCOUNTER — Other Ambulatory Visit (HOSPITAL_COMMUNITY): Payer: Self-pay

## 2022-09-18 MED ORDER — ATORVASTATIN CALCIUM 20 MG PO TABS
20.0000 mg | ORAL_TABLET | Freq: Every day | ORAL | 0 refills | Status: DC
  Filled 2022-09-18: qty 90, 90d supply, fill #0

## 2022-09-21 ENCOUNTER — Ambulatory Visit: Payer: No Typology Code available for payment source

## 2022-09-21 DIAGNOSIS — G4733 Obstructive sleep apnea (adult) (pediatric): Secondary | ICD-10-CM

## 2022-09-21 DIAGNOSIS — R0683 Snoring: Secondary | ICD-10-CM

## 2022-09-22 DIAGNOSIS — G4733 Obstructive sleep apnea (adult) (pediatric): Secondary | ICD-10-CM | POA: Diagnosis not present

## 2022-09-23 ENCOUNTER — Other Ambulatory Visit (HOSPITAL_COMMUNITY): Payer: Self-pay

## 2022-09-23 ENCOUNTER — Other Ambulatory Visit: Payer: Self-pay | Admitting: Medical

## 2022-09-23 MED ORDER — VENLAFAXINE HCL ER 37.5 MG PO CP24
37.5000 mg | ORAL_CAPSULE | Freq: Every day | ORAL | 0 refills | Status: DC
  Filled 2022-09-23: qty 30, 30d supply, fill #0

## 2022-09-24 ENCOUNTER — Other Ambulatory Visit (HOSPITAL_COMMUNITY): Payer: Self-pay

## 2022-09-27 ENCOUNTER — Other Ambulatory Visit (HOSPITAL_COMMUNITY): Payer: Self-pay

## 2022-10-07 ENCOUNTER — Ambulatory Visit (INDEPENDENT_AMBULATORY_CARE_PROVIDER_SITE_OTHER): Payer: No Typology Code available for payment source | Admitting: Adult Health

## 2022-10-07 ENCOUNTER — Encounter: Payer: Self-pay | Admitting: Adult Health

## 2022-10-07 VITALS — BP 108/86 | HR 79 | Temp 98.2°F | Ht 70.8 in | Wt 221.4 lb

## 2022-10-07 DIAGNOSIS — E669 Obesity, unspecified: Secondary | ICD-10-CM | POA: Diagnosis not present

## 2022-10-07 DIAGNOSIS — G4733 Obstructive sleep apnea (adult) (pediatric): Secondary | ICD-10-CM | POA: Diagnosis not present

## 2022-10-07 NOTE — Assessment & Plan Note (Signed)
Healthy weight loss 

## 2022-10-07 NOTE — Progress Notes (Signed)
Reviewed and agree with assessment/plan.   Chesley Mires, MD [facility] Pulmonary/Critical Care 10/07/2022, 1:58 PM Pager:  [phone]

## 2022-10-07 NOTE — Assessment & Plan Note (Signed)
Mild OSA , discuss sleep study results in detail - discussed how weight can impact sleep and risk for sleep disordered breathing - discussed options to assist with weight loss: combination of diet modification, cardiovascular and strength training exercises   - had an extensive discussion regarding the adverse health consequences related to untreated sleep disordered breathing - specifically discussed the risks for hypertension, coronary artery disease, cardiac dysrhythmias, cerebrovascular disease, and diabetes - lifestyle modification discussed   - discussed how sleep disruption can increase risk of accidents, particularly when driving - safe driving practices were discussed   Plan Patient Instructions  Refer to Dr. Ron Parker for oral appliance .  Healthy sleep regimen Do not drive if sleepy Use caution with sedating medications Follow-up in 6 months and As needed

## 2022-10-07 NOTE — Progress Notes (Signed)
$'@Patient't$  ID: Jerry Lopez, male    DOB: 12-24-1980, 41 y.o.   MRN: [id number]  Chief Complaint  Patient presents with   Follow-up    Referring provider: Elise Benne  HPI: 41 year old male seen for sleep consult June 17, 2022 for restless sleep snoring and daytime sleepiness found to have Mild OSA   TEST/EVENTS :   10/07/2022 Follow up : OSA  Patient presents for a 39-monthfollow-up.  Patient was seen last visit for a sleep consult with restless sleep, snoring and daytime sleepiness.  He was set up for home sleep study that was done September 21, 2022 that showed mild sleep apnea with a AHI at 13.7/hour and SpO2 low at 83%.  We reviewed his sleep study results in detail and went over treatment options including weight loss, oral appliance and CPAP therapy.  Patient would like to proceed with oral appliance referral .   No Known Allergies  Immunization History  Administered Date(s) Administered   Influenza,inj,Quad PF,6+ Mos 10/30/2016   Tdap 11/14/2013, 04/08/2019    Past Medical History:  Diagnosis Date   Anxiety    Arthritis    Chest pain around 12-02-2014   GERD (gastroesophageal reflux disease)    Hypercholesteremia     Tobacco History: Social History   Tobacco Use  Smoking Status Some Days   Packs/day: 0.50   Years: 5.00   Total pack years: 2.50   Types: Cigarettes   Last attempt to quit: 12/26/2015   Years since quitting: 6.7  Smokeless Tobacco Never  Tobacco Comments   Stopped smoking again on 10/04/22 wearing patches 14 mg, hfb   Ready to quit: Not Answered Counseling given: Not Answered Tobacco comments: Stopped smoking again on 10/04/22 wearing patches 14 mg, hfb   Outpatient Medications Prior to Visit  Medication Sig Dispense Refill   ALPRAZolam (XANAX) 0.5 MG tablet Take 1 tablet (0.5 mg total) by mouth daily as needed for severe anxiety or insomnia (max of 1 tablet per day). 16 tablet 0   atorvastatin (LIPITOR) 20 MG tablet Take 1  tablet by mouth daily. 90 tablet 0   busPIRone (BUSPAR) 7.5 MG tablet TAKE 1 TABLET BY MOUTH 2 TIMES DAILY 60 tablet 2   esomeprazole (NEXIUM) 20 MG capsule Take 1 capsule (20 mg total) by mouth daily at 12 noon. 90 capsule 1   fenofibrate 160 MG tablet TAKE 1 TABLET BY MOUTH ONCE DAILY 90 tablet 1   nicotine (NICODERM CQ - DOSED IN MG/24 HOURS) 14 mg/24hr patch Place 14 mg onto the skin daily.     traZODone (DESYREL) 50 MG tablet Take 1 tablet by mouth at bedtime. 30 tablet 11   venlafaxine XR (EFFEXOR XR) 37.5 MG 24 hr capsule Take 1 capsule (37.5 mg total) by mouth daily with breakfast. 30 capsule 0   SYRINGE-NEEDLE, DISP, 3 ML (B-D 3CC LUER-LOK SYR 25GX5/8") 25G X 5/8" 3 ML MISC Use to inject medication once weekly for 4 weeks then once monthly for 5 months (Patient not taking: Reported on 08/19/2022) 10 each 0   Vitamin D, Ergocalciferol, (DRISDOL) 1.25 MG (50000 UNIT) CAPS capsule Take 1 capsule (50,000 Units total) by mouth every 7 (seven) days. (Patient not taking: Reported on 10/07/2022) 8 capsule 0   No facility-administered medications prior to visit.     Review of Systems:   Constitutional:   No  weight loss, night sweats,  Fevers, chills,  +fatigue, or  lassitude.  HEENT:   No headaches,  Difficulty  swallowing,  Tooth/dental problems, or  Sore throat,                No sneezing, itching, ear ache, nasal congestion, post nasal drip,   CV:  No chest pain,  Orthopnea, PND, swelling in lower extremities, anasarca, dizziness, palpitations, syncope.   GI  No heartburn, indigestion, abdominal pain, nausea, vomiting, diarrhea, change in bowel habits, loss of appetite, bloody stools.   Resp: No shortness of breath with exertion or at rest.  No excess mucus, no productive cough,  No non-productive cough,  No coughing up of blood.  No change in color of mucus.  No wheezing.  No chest wall deformity  Skin: no rash or lesions.  GU: no dysuria, change in color of urine, no urgency or  frequency.  No flank pain, no hematuria   MS:  No joint pain or swelling.  No decreased range of motion.  No back pain.    Physical Exam  BP 108/86 (BP Location: Left Arm, Patient Position: Sitting, Cuff Size: Large)   Pulse 79   Temp 98.2 F (36.8 C) (Oral)   Ht 5' 10.8" (1.798 m)   Wt 221 lb 6.4 oz (100.4 kg)   SpO2 98%   BMI 31.05 kg/m   GEN: A/Ox3; pleasant , NAD, well nourished    HEENT:  [place]/AT,   NOSE-clear, THROAT-clear, no lesions, no postnasal drip or exudate noted. Class 2 MP airway   NECK:  Supple w/ fair ROM; no JVD; normal carotid impulses w/o bruits; no thyromegaly or nodules palpated; no lymphadenopathy.    RESP  Clear  P & A; w/o, wheezes/ rales/ or rhonchi. no accessory muscle use, no dullness to percussion  CARD:  RRR, no m/r/g, no peripheral edema, pulses intact, no cyanosis or clubbing.  GI:   Soft & nt; nml bowel sounds; no organomegaly or masses detected.   Musco: Warm bil, no deformities or joint swelling noted.   Neuro: alert, no focal deficits noted.    Skin: Warm, no lesions or rashes    Lab Results:  CBC   BMET   BNP No results found for: "BNP"  ProBNP No results found for: "PROBNP"  Imaging: No results found.        No data to display          No results found for: "NITRICOXIDE"      Assessment & Plan:   OSA (obstructive sleep apnea) Mild OSA , discuss sleep study results in detail - discussed how weight can impact sleep and risk for sleep disordered breathing - discussed options to assist with weight loss: combination of diet modification, cardiovascular and strength training exercises   - had an extensive discussion regarding the adverse health consequences related to untreated sleep disordered breathing - specifically discussed the risks for hypertension, coronary artery disease, cardiac dysrhythmias, cerebrovascular disease, and diabetes - lifestyle modification discussed   - discussed how sleep  disruption can increase risk of accidents, particularly when driving - safe driving practices were discussed   Plan Patient Instructions  Refer to Dr. Ron Parker for oral appliance .  Healthy sleep regimen Do not drive if sleepy Use caution with sedating medications Follow-up in 6 months and As needed      Obesity (BMI 30.0-34.9) Healthy weight loss      Rexene Edison, NP 10/07/2022

## 2022-10-07 NOTE — Patient Instructions (Addendum)
Refer to Dr. Ron Parker for oral appliance .  Healthy sleep regimen Do not drive if sleepy Use caution with sedating medications Follow-up in 6 months and As needed

## 2022-10-21 ENCOUNTER — Other Ambulatory Visit (HOSPITAL_COMMUNITY): Payer: Self-pay

## 2022-10-26 ENCOUNTER — Other Ambulatory Visit (HOSPITAL_COMMUNITY): Payer: Self-pay

## 2022-10-26 ENCOUNTER — Other Ambulatory Visit: Payer: Self-pay

## 2022-10-26 ENCOUNTER — Other Ambulatory Visit: Payer: Self-pay | Admitting: Medical

## 2022-10-26 MED ORDER — BUSPIRONE HCL 7.5 MG PO TABS
7.5000 mg | ORAL_TABLET | Freq: Two times a day (BID) | ORAL | 2 refills | Status: DC
  Filled 2022-10-26: qty 60, 30d supply, fill #0
  Filled 2022-11-26: qty 60, 30d supply, fill #1
  Filled 2023-01-01: qty 60, 30d supply, fill #2

## 2022-10-27 ENCOUNTER — Other Ambulatory Visit: Payer: Self-pay | Admitting: Medical

## 2022-10-28 ENCOUNTER — Other Ambulatory Visit: Payer: Self-pay

## 2022-10-28 ENCOUNTER — Other Ambulatory Visit (HOSPITAL_COMMUNITY): Payer: Self-pay

## 2022-10-28 MED ORDER — VENLAFAXINE HCL ER 37.5 MG PO CP24
37.5000 mg | ORAL_CAPSULE | Freq: Every day | ORAL | 0 refills | Status: DC
  Filled 2022-10-28: qty 30, 30d supply, fill #0

## 2022-11-11 ENCOUNTER — Ambulatory Visit (INDEPENDENT_AMBULATORY_CARE_PROVIDER_SITE_OTHER): Payer: 59 | Admitting: Medical

## 2022-11-11 VITALS — BP 130/86 | HR 64 | Resp 18 | Ht 70.0 in | Wt 220.0 lb

## 2022-11-11 DIAGNOSIS — M79672 Pain in left foot: Secondary | ICD-10-CM

## 2022-11-11 NOTE — Patient Instructions (Addendum)
Left foot pain(etiology?( Ganglion cyst, lipoma, stress fracture plantar fascia)- 2 weeks of pain. High arch. Tender distal first metatarsal. Can use iburofen and Dr Darrick Grinder heel pad. Placed referral to podiatrist. Call and see if can see you by early next week. If not then recommend going ahead and getting xray of foot.  Follow up as needed. Let me know if having difficulty getting in with podiatrist.

## 2022-11-11 NOTE — Progress Notes (Signed)
Subjective:    Patient ID: Jerry Lopez, male    DOB: [birth year], 42 y.o.   MRN: [id number]  HPI  Pt states over past 2 weeks he started to get pain in medial aspect of foot. He notes swollen fleshy type air per his description. Mild pain on every step. No redness no fever and no chills.    Review of Systems  Constitutional:  Negative for chills, fatigue and fever.  Respiratory:  Negative for cough, chest tightness, shortness of breath and wheezing.   Cardiovascular:  Negative for chest pain and palpitations.  Gastrointestinal:  Negative for abdominal pain.  Musculoskeletal:  Positive for joint swelling. Negative for back pain.  Skin:  Negative for rash.    Past Medical History:  Diagnosis Date   Anxiety    Arthritis    Chest pain around 12-02-2014   GERD (gastroesophageal reflux disease)    Hypercholesteremia      Social History   Socioeconomic History   Marital status: Married    Spouse name: Not on file   Number of children: Not on file   Years of education: Not on file   Highest education level: Not on file  Occupational History   Not on file  Tobacco Use   Smoking status: Some Days    Packs/day: 0.50    Years: 5.00    Total pack years: 2.50    Types: Cigarettes    Last attempt to quit: 12/26/2015    Years since quitting: 6.8   Smokeless tobacco: Never   Tobacco comments:    Stopped smoking again on 10/04/22 wearing patches 14 mg, hfb  Vaping Use   Vaping Use: Never used  Substance and Sexual Activity   Alcohol use: Yes    Alcohol/week: 6.0 standard drinks of alcohol    Types: 6 Cans of beer per week    Comment: occasional   Drug use: No   Sexual activity: Yes    Birth control/protection: None  Other Topics Concern   Not on file  Social History Narrative   Not on file   Social Determinants of Health   Financial Resource Strain: Not on file  Food Insecurity: Not on file  Transportation Needs: Not on file  Physical Activity: Not on file   Stress: Not on file  Social Connections: Not on file  Intimate Partner Violence: Not on file    Past Surgical History:  Procedure Laterality Date   ELBOW FRACTURE SURGERY Right 07.16.14   Radial Repair   INGUINAL HERNIA REPAIR Right 01/06/2015   Procedure: LAPAROSCOPIC REPAIR OF RIGHT INGUINAL HERNIA WITH MESH;  Surgeon: Greer Pickerel, MD;  Location: [facility];  Service: General;  Laterality: Right;   INSERTION OF MESH Right 01/06/2015   Procedure: INSERTION OF MESH;  Surgeon: Greer Pickerel, MD;  Location: [facility];  Service: General;  Laterality: Right;   LEG SURGERY Right    lower, for fracture age 72   VASECTOMY     VASECTOMY REVERSAL      Family History  Problem Relation Age of Onset   Healthy Mother        Living   Hypertension Father        Living   Non-Hodgkin's lymphoma Father    Diabetes Father    Hypertension Other        Paternal side   Heart attack Paternal Grandfather    Healthy Sister        x1   Healthy Daughter  x3    No Known Allergies  Current Outpatient Medications on File Prior to Visit  Medication Sig Dispense Refill   ALPRAZolam (XANAX) 0.5 MG tablet Take 1 tablet (0.5 mg total) by mouth daily as needed for severe anxiety or insomnia (max of 1 tablet per day). 16 tablet 0   atorvastatin (LIPITOR) 20 MG tablet Take 1 tablet by mouth daily. 90 tablet 0   busPIRone (BUSPAR) 7.5 MG tablet Take 1 tablet (7.5 mg total) by mouth 2 (two) times daily. 60 tablet 2   esomeprazole (NEXIUM) 20 MG capsule Take 1 capsule (20 mg total) by mouth daily at 12 noon. 90 capsule 1   fenofibrate 160 MG tablet TAKE 1 TABLET BY MOUTH ONCE DAILY 90 tablet 1   nicotine (NICODERM CQ - DOSED IN MG/24 HOURS) 14 mg/24hr patch Place 14 mg onto the skin daily.     traZODone (DESYREL) 50 MG tablet Take 1 tablet by mouth at bedtime. 30 tablet 11   venlafaxine XR (EFFEXOR XR) 37.5 MG 24 hr capsule Take 1 capsule (37.5 mg total) by mouth daily with breakfast. Must have appointment/labs  for further refills 30 capsule 0   No current facility-administered medications on file prior to visit.    BP 130/86   Pulse 64   Resp 18   Ht '5\' 10"'$  (1.778 m)   Wt 220 lb (99.8 kg)   SpO2 97%   BMI 31.57 kg/m        Objective:   Physical Exam  General- No acute distress. Pleasant patient. Neck- Full range of motion, no jvd Lungs- Clear, even and unlabored. Heart- regular rate and rhythm. Neurologic- CNII- XII grossly intact.  Rt foot- high arch to foot. Pain on palpation of first metatarsal. Medial aspect of foot faint swollen area but not red, not warm and not tender.         Assessment & Plan:   Patient Instructions  Left foot pain(etiology?( Ganglion cyst, lipoma, stress fracture plantar fascia)- 2 weeks of pain. High arch. Tender distal first metatarsal. Can use iburofen and Dr Darrick Grinder heel pad. Placed referral to podiatrist. Call and see if can see you by early next week. If not then recommend going ahead and getting xray of foot.  Follow up as needed. Let me know if having difficulty getting in with podiatrist.    Mackie Pai, PA-C

## 2022-11-16 ENCOUNTER — Ambulatory Visit: Payer: 59 | Admitting: Podiatry

## 2022-11-16 ENCOUNTER — Other Ambulatory Visit (HOSPITAL_COMMUNITY): Payer: Self-pay

## 2022-11-16 ENCOUNTER — Ambulatory Visit (INDEPENDENT_AMBULATORY_CARE_PROVIDER_SITE_OTHER): Payer: 59

## 2022-11-16 DIAGNOSIS — M79671 Pain in right foot: Secondary | ICD-10-CM | POA: Diagnosis not present

## 2022-11-16 DIAGNOSIS — M76829 Posterior tibial tendinitis, unspecified leg: Secondary | ICD-10-CM | POA: Diagnosis not present

## 2022-11-16 DIAGNOSIS — Q666 Other congenital valgus deformities of feet: Secondary | ICD-10-CM | POA: Diagnosis not present

## 2022-11-16 MED ORDER — MELOXICAM 15 MG PO TABS
15.0000 mg | ORAL_TABLET | Freq: Every day | ORAL | 0 refills | Status: DC | PRN
  Filled 2022-11-16: qty 30, 30d supply, fill #0

## 2022-11-16 NOTE — Progress Notes (Unsigned)
Subjective:   Patient ID: Jerry Lopez, male   DOB: 42 y.o.   MRN: [id number]   HPI Chief Complaint  Patient presents with   Foot Pain    Right foot, pain located at the arch, varies in intensity    No injuries when this started a few months ago. He noticed it once when driving when extending. He states when he steps wrogn or put pressure on it he gets pain, pointing to the medial arch (nav tub). Ibupfoen helps when he takes it. No swelling. No numbness or tingling. He has been remodeling a house and the more activity the more it hurts.    ROS      Objective:  Physical Exam  ***     Assessment:  ***     Plan:  ***

## 2022-11-16 NOTE — Patient Instructions (Signed)
For inserts I like powersteps, superfeet, aetrex  Look at getting more supportive shoes like Hoka, New Balance, Brooks --  Plantar Fasciitis (Heel Spur Syndrome) with Rehab- YOU DON'T HAVE THIS BUT THE EXERCISES CAN HELP The plantar fascia is a fibrous, ligament-like, soft-tissue structure that spans the bottom of the foot. Plantar fasciitis is a condition that causes pain in the foot due to inflammation of the tissue. SYMPTOMS  Pain and tenderness on the underneath side of the foot. Pain that worsens with standing or walking. CAUSES  Plantar fasciitis is caused by irritation and injury to the plantar fascia on the underneath side of the foot. Common mechanisms of injury include: Direct trauma to bottom of the foot. Damage to a small nerve that runs under the foot where the main fascia attaches to the heel bone. Stress placed on the plantar fascia due to bone spurs. RISK INCREASES WITH:  Activities that place stress on the plantar fascia (running, jumping, pivoting, or cutting). Poor strength and flexibility. Improperly fitted shoes. Tight calf muscles. Flat feet. Failure to warm-up properly before activity. Obesity. PREVENTION Warm up and stretch properly before activity. Allow for adequate recovery between workouts. Maintain physical fitness: Strength, flexibility, and endurance. Cardiovascular fitness. Maintain a health body weight. Avoid stress on the plantar fascia. Wear properly fitted shoes, including arch supports for individuals who have flat feet.  PROGNOSIS  If treated properly, then the symptoms of plantar fasciitis usually resolve without surgery. However, occasionally surgery is necessary.  RELATED COMPLICATIONS  Recurrent symptoms that may result in a chronic condition. Problems of the lower back that are caused by compensating for the injury, such as limping. Pain or weakness of the foot during push-off following surgery. Chronic inflammation, scarring, and  partial or complete fascia tear, occurring more often from repeated injections.  TREATMENT  Treatment initially involves the use of ice and medication to help reduce pain and inflammation. The use of strengthening and stretching exercises may help reduce pain with activity, especially stretches of the Achilles tendon. These exercises may be performed at home or with a therapist. Your caregiver may recommend that you use heel cups of arch supports to help reduce stress on the plantar fascia. Occasionally, corticosteroid injections are given to reduce inflammation. If symptoms persist for greater than 6 months despite non-surgical (conservative), then surgery may be recommended.   MEDICATION  If pain medication is necessary, then nonsteroidal anti-inflammatory medications, such as aspirin and ibuprofen, or other minor pain relievers, such as acetaminophen, are often recommended. Do not take pain medication within 7 days before surgery. Prescription pain relievers may be given if deemed necessary by your caregiver. Use only as directed and only as much as you need. Corticosteroid injections may be given by your caregiver. These injections should be reserved for the most serious cases, because they may only be given a certain number of times.  HEAT AND COLD Cold treatment (icing) relieves pain and reduces inflammation. Cold treatment should be applied for 10 to 15 minutes every 2 to 3 hours for inflammation and pain and immediately after any activity that aggravates your symptoms. Use ice packs or massage the area with a piece of ice (ice massage). Heat treatment may be used prior to performing the stretching and strengthening activities prescribed by your caregiver, physical therapist, or athletic trainer. Use a heat pack or soak the injury in warm water.  SEEK IMMEDIATE MEDICAL CARE IF: Treatment seems to offer no benefit, or the condition worsens. Any medications produce adverse  side  effects.  EXERCISES- RANGE OF MOTION (ROM) AND STRETCHING EXERCISES - Plantar Fasciitis (Heel Spur Syndrome) These exercises may help you when beginning to rehabilitate your injury. Your symptoms may resolve with or without further involvement from your physician, physical therapist or athletic trainer. While completing these exercises, remember:  Restoring tissue flexibility helps normal motion to return to the joints. This allows healthier, less painful movement and activity. An effective stretch should be held for at least 30 seconds. A stretch should never be painful. You should only feel a gentle lengthening or release in the stretched tissue.  RANGE OF MOTION - Toe Extension, Flexion Sit with your right / left leg crossed over your opposite knee. Grasp your toes and gently pull them back toward the top of your foot. You should feel a stretch on the bottom of your toes and/or foot. Hold this stretch for 10 seconds. Now, gently pull your toes toward the bottom of your foot. You should feel a stretch on the top of your toes and or foot. Hold this stretch for 10 seconds. Repeat  times. Complete this stretch 3 times per day.   RANGE OF MOTION - Ankle Dorsiflexion, Active Assisted Remove shoes and sit on a chair that is preferably not on a carpeted surface. Place right / left foot under knee. Extend your opposite leg for support. Keeping your heel down, slide your right / left foot back toward the chair until you feel a stretch at your ankle or calf. If you do not feel a stretch, slide your bottom forward to the edge of the chair, while still keeping your heel down. Hold this stretch for 10 seconds. Repeat 3 times. Complete this stretch 2 times per day.   STRETCH  Gastroc, Standing Place hands on wall. Extend right / left leg, keeping the front knee somewhat bent. Slightly point your toes inward on your back foot. Keeping your right / left heel on the floor and your knee straight, shift  your weight toward the wall, not allowing your back to arch. You should feel a gentle stretch in the right / left calf. Hold this position for 10 seconds. Repeat 3 times. Complete this stretch 2 times per day.  STRETCH  Soleus, Standing Place hands on wall. Extend right / left leg, keeping the other knee somewhat bent. Slightly point your toes inward on your back foot. Keep your right / left heel on the floor, bend your back knee, and slightly shift your weight over the back leg so that you feel a gentle stretch deep in your back calf. Hold this position for 10 seconds. Repeat 3 times. Complete this stretch 2 times per day.  STRETCH  Gastrocsoleus, Standing  Note: This exercise can place a lot of stress on your foot and ankle. Please complete this exercise only if specifically instructed by your caregiver.  Place the ball of your right / left foot on a step, keeping your other foot firmly on the same step. Hold on to the wall or a rail for balance. Slowly lift your other foot, allowing your body weight to press your heel down over the edge of the step. You should feel a stretch in your right / left calf. Hold this position for 10 seconds. Repeat this exercise with a slight bend in your right / left knee. Repeat 3 times. Complete this stretch 2 times per day.   STRENGTHENING EXERCISES - Plantar Fasciitis (Heel Spur Syndrome)  These exercises may help you when beginning  to rehabilitate your injury. They may resolve your symptoms with or without further involvement from your physician, physical therapist or athletic trainer. While completing these exercises, remember:  Muscles can gain both the endurance and the strength needed for everyday activities through controlled exercises. Complete these exercises as instructed by your physician, physical therapist or athletic trainer. Progress the resistance and repetitions only as guided.  STRENGTH - Towel Curls Sit in a chair positioned on a  non-carpeted surface. Place your foot on a towel, keeping your heel on the floor. Pull the towel toward your heel by only curling your toes. Keep your heel on the floor. Repeat 3 times. Complete this exercise 2 times per day.  STRENGTH - Ankle Inversion Secure one end of a rubber exercise band/tubing to a fixed object (table, pole). Loop the other end around your foot just before your toes. Place your fists between your knees. This will focus your strengthening at your ankle. Slowly, pull your big toe up and in, making sure the band/tubing is positioned to resist the entire motion. Hold this position for 10 seconds. Have your muscles resist the band/tubing as it slowly pulls your foot back to the starting position. Repeat 3 times. Complete this exercises 2 times per day.  Document Released: 10/11/2005 Document Revised: 01/03/2012 Document Reviewed: 01/23/2009 [facility] Patient Information 2014 [place], [place].

## 2022-11-22 ENCOUNTER — Other Ambulatory Visit: Payer: Self-pay | Admitting: Medical

## 2022-11-22 ENCOUNTER — Other Ambulatory Visit (HOSPITAL_COMMUNITY): Payer: Self-pay

## 2022-11-22 DIAGNOSIS — E785 Hyperlipidemia, unspecified: Secondary | ICD-10-CM

## 2022-11-22 MED ORDER — FENOFIBRATE 160 MG PO TABS
160.0000 mg | ORAL_TABLET | Freq: Every day | ORAL | 1 refills | Status: DC
  Filled 2022-11-22: qty 90, 90d supply, fill #0
  Filled 2023-03-02: qty 90, 90d supply, fill #1

## 2022-11-25 ENCOUNTER — Other Ambulatory Visit (HOSPITAL_COMMUNITY): Payer: Self-pay

## 2022-11-26 ENCOUNTER — Other Ambulatory Visit: Payer: Self-pay

## 2022-12-01 ENCOUNTER — Other Ambulatory Visit (HOSPITAL_BASED_OUTPATIENT_CLINIC_OR_DEPARTMENT_OTHER): Payer: Self-pay

## 2022-12-01 ENCOUNTER — Other Ambulatory Visit: Payer: Self-pay | Admitting: Medical

## 2022-12-01 ENCOUNTER — Other Ambulatory Visit: Payer: Self-pay

## 2022-12-01 MED ORDER — VENLAFAXINE HCL ER 37.5 MG PO CP24
37.5000 mg | ORAL_CAPSULE | Freq: Every day | ORAL | 0 refills | Status: DC
  Filled 2022-12-01: qty 30, 30d supply, fill #0

## 2022-12-02 ENCOUNTER — Other Ambulatory Visit (HOSPITAL_COMMUNITY): Payer: Self-pay

## 2022-12-18 ENCOUNTER — Other Ambulatory Visit (HOSPITAL_COMMUNITY): Payer: Self-pay

## 2022-12-18 ENCOUNTER — Other Ambulatory Visit: Payer: Self-pay | Admitting: Medical

## 2022-12-18 DIAGNOSIS — E785 Hyperlipidemia, unspecified: Secondary | ICD-10-CM

## 2022-12-20 ENCOUNTER — Other Ambulatory Visit (HOSPITAL_COMMUNITY): Payer: Self-pay

## 2022-12-20 ENCOUNTER — Other Ambulatory Visit: Payer: Self-pay

## 2022-12-20 MED ORDER — ATORVASTATIN CALCIUM 20 MG PO TABS
20.0000 mg | ORAL_TABLET | Freq: Every day | ORAL | 0 refills | Status: DC
  Filled 2022-12-20: qty 90, 90d supply, fill #0

## 2022-12-29 ENCOUNTER — Ambulatory Visit
Admission: RE | Admit: 2022-12-29 | Discharge: 2022-12-29 | Disposition: A | Discharge status: Home / Self Care | Payer: 59 | Source: Ambulatory Visit | Attending: Family Medicine | Admitting: Family Medicine

## 2022-12-29 ENCOUNTER — Encounter: Payer: 59 | Admitting: Nurse Practitioner

## 2022-12-29 VITALS — BP 135/99 | HR 71 | Temp 98.6°F | Resp 16 | Ht 70.0 in | Wt 218.0 lb

## 2022-12-29 DIAGNOSIS — R066 Hiccough: Secondary | ICD-10-CM

## 2022-12-29 DIAGNOSIS — Z8719 Personal history of other diseases of the digestive system: Secondary | ICD-10-CM | POA: Diagnosis not present

## 2022-12-29 DIAGNOSIS — K219 Gastro-esophageal reflux disease without esophagitis: Secondary | ICD-10-CM

## 2022-12-29 MED ORDER — BACLOFEN 20 MG PO TABS: 20.0000 mg | ORAL_TABLET | Freq: Three times a day (TID) | ORAL | 0 refills | Status: DC

## 2022-12-29 MED ORDER — PANTOPRAZOLE SODIUM 20 MG PO TBEC: 20.0000 mg | DELAYED_RELEASE_TABLET | Freq: Every day | ORAL | 0 refills | Status: DC

## 2022-12-29 NOTE — ED Triage Notes (Addendum)
Hiccups started yesterday morning on waking  Pt was smoking prior to start of  hiccups  Has resolved on their own in the past  pt states reflux woke him up x 2 last night  Takes nexium '40mg'$   q am Dexilant during the night x 1  (does not have a script) Pt tried to see his PCP today virtually & was told to go  to urgent care

## 2022-12-29 NOTE — Discharge Instructions (Addendum)
Discontinue the Nexium and try Protonix 1 a day Take this on an empty stomach Take baclofen for the hiccups.  This is a muscle relaxer.  Caution drowsiness Call here or call your doctor if this does not work Elevate the head of your bed

## 2022-12-29 NOTE — ED Provider Notes (Signed)
[facility] C[facility]    CSN: [id number] Arrival date & time: 12/29/22  1639      History   Chief Complaint Chief Complaint  Patient presents with   Hiccups    HPI Jerry Lopez is a 42 y.o. male.   HPI  Patient has a history of GERD and acid reflux symptoms.  He is taking Nexium 40 mg a day.  He uses over-the-counter medication.  He has had 2 UGI endoscopies which showed hiatal hernia and esophageal inflammation.  No history of bleeding or ulcers.  He states that he had spicy food last night and then awakened with reflux twice.  Has had hiccups since yesterday morning.  They have not stopped and he is feeling quite tired and has chest soreness.  Past Medical History:  Diagnosis Date   Anxiety    Arthritis    Chest pain around 12-02-2014   GERD (gastroesophageal reflux disease)    Hypercholesteremia     Patient Active Problem List   Diagnosis Date Noted   OSA (obstructive sleep apnea) 10/07/2022   Snoring 06/17/2022   Obesity (BMI 30.0-34.9) 06/17/2022   Visit for preventive health examination 01/03/2018   Physical deconditioning 11/21/2016   Chronic pain of right inguinal region 10/08/2014   Hyperlipidemia 05/23/2014   Insomnia 11/15/2013   Encounter for preventive health examination 08/08/2013   Anxiety and depression 08/08/2013   GERD (gastroesophageal reflux disease) 08/08/2013   Needs smoking cessation education 08/08/2013    Past Surgical History:  Procedure Laterality Date   ELBOW FRACTURE SURGERY Right 07.16.14   Radial Repair   INGUINAL HERNIA REPAIR Right 01/06/2015   Procedure: LAPAROSCOPIC REPAIR OF RIGHT INGUINAL HERNIA WITH MESH;  Surgeon: Greer Pickerel, MD;  Location: [facility];  Service: General;  Laterality: Right;   INSERTION OF MESH Right 01/06/2015   Procedure: INSERTION OF MESH;  Surgeon: Greer Pickerel, MD;  Location: [facility];  Service: General;  Laterality: Right;   LEG SURGERY Right    lower, for fracture age 72   [place] Medications    Prior to Admission medications   Medication Sig Start Date End Date Taking? Authorizing Provider  baclofen (LIORESAL) 20 MG tablet Take 1 tablet (20 mg total) by mouth 3 (three) times daily. 12/29/22  Yes Raylene Everts, MD  pantoprazole (PROTONIX) 20 MG tablet Take 1 tablet (20 mg total) by mouth daily. 12/29/22  Yes Raylene Everts, MD  ALPRAZolam Duanne Moron) 0.5 MG tablet Take 1 tablet (0.5 mg total) by mouth daily as needed for severe anxiety or insomnia (max of 1 tablet per day). 05/27/22   Saguier, Percell Miller, PA-C  atorvastatin (LIPITOR) 20 MG tablet Take 1 tablet by mouth daily. 12/20/22   Saguier, Percell Miller, PA-C  busPIRone (BUSPAR) 7.5 MG tablet Take 1 tablet (7.5 mg total) by mouth 2 (two) times daily. 10/26/22   Saguier, Percell Miller, PA-C  fenofibrate 160 MG tablet Take 1 tablet (160 mg total) by mouth daily. 11/22/22 11/22/23  Saguier, Percell Miller, PA-C  meloxicam (MOBIC) 15 MG tablet Take 1 tablet (15 mg total) by mouth daily as needed for pain. Patient not taking: Reported on 12/29/2022 11/16/22   Trula Slade, DPM  traZODone (DESYREL) 50 MG tablet Take 1 tablet by mouth at bedtime. 07/16/22   Saguier, Percell Miller, PA-C  venlafaxine XR (EFFEXOR XR) 37.5 MG 24 hr capsule Take 1 capsule (37.5 mg total) by mouth daily with breakfast.  Must have appointment/labs for further refills 12/01/22   Saguier, Percell Miller, PA-C    Family History Family History  Problem Relation Age of Onset   Healthy Mother        Living   Hypertension Father        Living   Non-Hodgkin's lymphoma Father    Diabetes Father    Hypertension Other        Paternal side   Heart attack Paternal Grandfather    Healthy Sister        x1   Healthy Daughter        x3    Social History Social History   Tobacco Use   Smoking status: Some Days    Packs/day: 0.50    Years: 5.00    Total pack years: 2.50    Types: Cigarettes    Last attempt to quit: 12/26/2015    Years since quitting: 7.0    Smokeless tobacco: Never   Tobacco comments:    Stopped smoking again on 10/04/22 wearing patches 14 mg, hfb  Vaping Use   Vaping Use: Never used  Substance Use Topics   Alcohol use: Yes    Alcohol/week: 6.0 standard drinks of alcohol    Types: 6 Cans of beer per week    Comment: occasional   Drug use: No     Allergies   Patient has no known allergies.   Review of Systems Review of Systems  See HPI Physical Exam Triage Vital Signs ED Triage Vitals  Enc Vitals Group     BP 12/29/22 1659 (!) 135/99     Pulse Rate 12/29/22 1659 71     Resp 12/29/22 1659 16     Temp 12/29/22 1659 98.6 F (37 C)     Temp Source 12/29/22 1659 Oral     SpO2 12/29/22 1659 98 %     Weight 12/29/22 1703 218 lb (98.9 kg)     Height 12/29/22 1703 '5\' 10"'$  (1.778 m)     Head Circumference --      Peak Flow --      Pain Score 12/29/22 1701 2     Pain Loc --      Pain Edu? --      Excl. in [place]? --    No data found.  Updated Vital Signs BP (!) 135/99 (BP Location: Right Arm)   Pulse 71   Temp 98.6 F (37 C) (Oral)   Resp 16   Ht '5\' 10"'$  (1.778 m)   Wt 98.9 kg   SpO2 98%   BMI 31.28 kg/m       Physical Exam Constitutional:      General: He is not in acute distress.    Appearance: He is well-developed. He is ill-appearing.     Comments: Frequent hiccups  HENT:     Head: Normocephalic and atraumatic.  Eyes:     Conjunctiva/sclera: Conjunctivae normal.     Pupils: Pupils are equal, round, and reactive to light.  Cardiovascular:     Rate and Rhythm: Normal rate and regular rhythm.     Heart sounds: Normal heart sounds.  Pulmonary:     Effort: Pulmonary effort is normal. No respiratory distress.     Breath sounds: Normal breath sounds.  Abdominal:     General: Bowel sounds are normal. There is no distension.     Palpations: Abdomen is soft.     Tenderness: There is no abdominal tenderness.  Musculoskeletal:  General: Normal range of motion.     Cervical back: Normal range  of motion and neck supple.  Skin:    General: Skin is warm and dry.  Neurological:     Mental Status: He is alert.      UC Treatments / Results  Labs (all labs ordered are listed, but only abnormal results are displayed) Labs Reviewed - No data to display  EKG   Radiology No results found.  Procedures Procedures (including critical care time)  Medications Ordered in UC Medications - No data to display  Initial Impression / Assessment and Plan / UC Course  I have reviewed the triage vital signs and the nursing notes.  Pertinent labs & imaging results that were available during my care of the patient were reviewed by me and considered in my medical decision making (see chart for details).    Reviewed GERD diet.  Avoidance of NSAID and spicy foods and alcohol.  Advised to stop smoking.  Elevate head of bed.  Will change his Nexium to Protonix to see if this helps better.  He states omeprazole does not work for him.  Follow-up with PCP Final Clinical Impressions(s) / UC Diagnoses   Final diagnoses:  Hiccups  Gastroesophageal reflux disease, unspecified whether esophagitis present  History of hiatal hernia     Discharge Instructions      Discontinue the Nexium and try Protonix 1 a day Take this on an empty stomach Take baclofen for the hiccups.  This is a muscle relaxer.  Caution drowsiness Call here or call your doctor if this does not work Elevate the head of your bed   ED Prescriptions     Medication Sig Dispense Auth. Provider   baclofen (LIORESAL) 20 MG tablet Take 1 tablet (20 mg total) by mouth 3 (three) times daily. 90 each Raylene Everts, MD   pantoprazole (PROTONIX) 20 MG tablet Take 1 tablet (20 mg total) by mouth daily. 30 tablet Raylene Everts, MD      PDMP not reviewed this encounter.   Raylene Everts, MD 12/29/22 [phone]

## 2022-12-29 NOTE — Progress Notes (Signed)
Because your symptoms have been present for so long, I feel your condition warrants further evaluation and I recommend that you be seen in a face to face visit. If you cannot be seen at an urgent care tonight you should go to the Emergency room     NOTE: There will be NO CHARGE for this eVisit   If you are having a true medical emergency please call 911.      For an urgent face to face visit, [facility] has eight urgent care centers for your convenience:   NEW!! [facility] [place] [facility] [place] Get Driving Directions [id number] [address] [place], [place] [facility] [place] [facility] [place] Get Driving Directions [id number] [place], [place] [address]   [place] [facility]) Get Driving Directions [id number] [address], [place] [address]  [place] [facility] [place] ([place]) Get Driving Directions [id number] [address] [place],  [place]  [address]  [place] [facility] - at [place] Get Driving Directions  [id number] [phone] [place],  [place] [address]   [facility] Get Driving Directions [id number] [place], [place], [place] [address]   [facility] Get Driving Directions  [id number] [address] [place],  [address]   [place] [facility] [place] Get Driving Directions [id number] [address] [place],  [address]  Your MyChart E-visit questionnaire answers were reviewed by a board certified advanced clinical practitioner to complete your personal care plan based on your specific symptoms.  Thank you for using e-Visits.

## 2022-12-30 ENCOUNTER — Telehealth: Payer: Self-pay

## 2022-12-30 ENCOUNTER — Ambulatory Visit: Payer: 59 | Admitting: Family Medicine

## 2022-12-30 NOTE — Telephone Encounter (Signed)
TC to f/u with pt after yesterday's visit to [facility]. Pt reports he is doing much better, hiccups resolved. No problems or questions at this time.

## 2022-12-30 NOTE — Progress Notes (Deleted)
[place] at [facility] [address] [place], [place] [address] [phone]  Date:  12/30/2022   Name:  Jerry Lopez   DOB:  Jul 04, 1981   MRN:  [id number]  PCP:  Mackie Pai, PA-C    Chief Complaint: No chief complaint on file.   History of Present Illness:  Jerry Lopez is a 42 y.o. very pleasant male patient who presents with the following:  Primary patient of my partner Mackie Pai, PA-C.  I have seen her once previously in 2017 History of GERD, sleep apnea, anxiety, hyperlipidemia Here today with concern of hiccups He was also seen yesterday at urgent care: Patient has a history of GERD and acid reflux symptoms. He is taking Nexium 40 mg a day. He uses over-the-counter medication. He has had 2 UGI endoscopies which showed hiatal hernia and esophageal inflammation. No history of bleeding or ulcers. He states that he had spicy food last night and then awakened with reflux twice. Has had hiccups since yesterday morning. They have not stopped and he is feeling quite tired and has chest soreness. ///////////////////////// Discontinue the Nexium and try Protonix 1 a day Take this on an empty stomach Take baclofen for the hiccups.  This is a muscle relaxer.  Caution drowsiness Call here or call your doctor if this does not work Elevate the head of your bed  Patient Active Problem List   Diagnosis Date Noted   OSA (obstructive sleep apnea) 10/07/2022   Snoring 06/17/2022   Obesity (BMI 30.0-34.9) 06/17/2022   Visit for preventive health examination 01/03/2018   Physical deconditioning 11/21/2016   Chronic pain of right inguinal region 10/08/2014   Hyperlipidemia 05/23/2014   Insomnia 11/15/2013   Encounter for preventive health examination 08/08/2013   Anxiety and depression 08/08/2013   GERD (gastroesophageal reflux disease) 08/08/2013   Needs smoking cessation education 08/08/2013    Past Medical History:   Diagnosis Date   Anxiety    Arthritis    Chest pain around 12-02-2014   GERD (gastroesophageal reflux disease)    Hypercholesteremia     Past Surgical History:  Procedure Laterality Date   ELBOW FRACTURE SURGERY Right 07.16.14   Radial Repair   INGUINAL HERNIA REPAIR Right 01/06/2015   Procedure: LAPAROSCOPIC REPAIR OF RIGHT INGUINAL HERNIA WITH MESH;  Surgeon: Greer Pickerel, MD;  Location: [facility];  Service: General;  Laterality: Right;   INSERTION OF MESH Right 01/06/2015   Procedure: INSERTION OF MESH;  Surgeon: Greer Pickerel, MD;  Location: [facility];  Service: General;  Laterality: Right;   LEG SURGERY Right    lower, for fracture age 17   VASECTOMY     VASECTOMY REVERSAL      Social History   Tobacco Use   Smoking status: Some Days    Packs/day: 0.50    Years: 5.00    Total pack years: 2.50    Types: Cigarettes    Last attempt to quit: 12/26/2015    Years since quitting: 7.0   Smokeless tobacco: Never   Tobacco comments:    Stopped smoking again on 10/04/22 wearing patches 14 mg, hfb  Vaping Use   Vaping Use: Never used  Substance Use Topics   Alcohol use: Yes    Alcohol/week: 6.0 standard drinks of alcohol    Types: 6 Cans of beer per week    Comment: occasional   Drug use: No    Family History  Problem Relation Age  of Onset   Healthy Mother        Living   Hypertension Father        Living   Non-Hodgkin's lymphoma Father    Diabetes Father    Hypertension Other        Paternal side   Heart attack Paternal Grandfather    Healthy Sister        x1   Healthy Daughter        x3    No Known Allergies  Medication list has been reviewed and updated.  Current Outpatient Medications on File Prior to Visit  Medication Sig Dispense Refill   ALPRAZolam (XANAX) 0.5 MG tablet Take 1 tablet (0.5 mg total) by mouth daily as needed for severe anxiety or insomnia (max of 1 tablet per day). 16 tablet 0   atorvastatin (LIPITOR) 20 MG tablet Take 1 tablet by mouth daily. 90  tablet 0   baclofen (LIORESAL) 20 MG tablet Take 1 tablet (20 mg total) by mouth 3 (three) times daily. 30 each 0   busPIRone (BUSPAR) 7.5 MG tablet Take 1 tablet (7.5 mg total) by mouth 2 (two) times daily. 60 tablet 2   fenofibrate 160 MG tablet Take 1 tablet (160 mg total) by mouth daily. 90 tablet 1   meloxicam (MOBIC) 15 MG tablet Take 1 tablet (15 mg total) by mouth daily as needed for pain. (Patient not taking: Reported on 12/29/2022) 30 tablet 0   pantoprazole (PROTONIX) 20 MG tablet Take 1 tablet (20 mg total) by mouth daily. 30 tablet 0   traZODone (DESYREL) 50 MG tablet Take 1 tablet by mouth at bedtime. 30 tablet 11   venlafaxine XR (EFFEXOR XR) 37.5 MG 24 hr capsule Take 1 capsule (37.5 mg total) by mouth daily with breakfast. Must have appointment/labs for further refills 30 capsule 0   No current facility-administered medications on file prior to visit.    Review of Systems:  As per HPI- otherwise negative.   Physical Examination: There were no vitals filed for this visit. There were no vitals filed for this visit. There is no height or weight on file to calculate BMI. Ideal Body Weight:    GEN: no acute distress. HEENT: Atraumatic, Normocephalic.  Ears and Nose: No external deformity. CV: RRR, No M/G/R. No JVD. No thrill. No extra heart sounds. PULM: CTA B, no wheezes, crackles, rhonchi. No retractions. No resp. distress. No accessory muscle use. ABD: S, NT, ND, +BS. No rebound. No HSM. EXTR: No c/c/e PSYCH: Normally interactive. Conversant.    Assessment and Plan: ***  Signed Lamar Blinks, MD

## 2023-01-01 ENCOUNTER — Other Ambulatory Visit (HOSPITAL_COMMUNITY): Payer: Self-pay

## 2023-01-03 ENCOUNTER — Other Ambulatory Visit (HOSPITAL_COMMUNITY): Payer: Self-pay

## 2023-01-03 ENCOUNTER — Other Ambulatory Visit: Payer: Self-pay | Admitting: Medical

## 2023-01-03 MED ORDER — VENLAFAXINE HCL ER 37.5 MG PO CP24
37.5000 mg | ORAL_CAPSULE | Freq: Every day | ORAL | 2 refills | Status: DC
  Filled 2023-01-03 – 2023-01-11 (×2): qty 30, 30d supply, fill #0
  Filled 2023-02-04: qty 30, 30d supply, fill #1
  Filled 2023-03-14: qty 30, 30d supply, fill #2

## 2023-01-11 ENCOUNTER — Other Ambulatory Visit: Payer: Self-pay

## 2023-01-11 ENCOUNTER — Other Ambulatory Visit (HOSPITAL_COMMUNITY): Payer: Self-pay

## 2023-01-12 ENCOUNTER — Other Ambulatory Visit (HOSPITAL_COMMUNITY): Payer: Self-pay

## 2023-01-19 ENCOUNTER — Other Ambulatory Visit (HOSPITAL_COMMUNITY): Payer: Self-pay

## 2023-02-02 ENCOUNTER — Other Ambulatory Visit: Payer: Self-pay | Admitting: Medical

## 2023-02-03 ENCOUNTER — Other Ambulatory Visit (HOSPITAL_COMMUNITY): Payer: Self-pay

## 2023-02-03 ENCOUNTER — Other Ambulatory Visit: Payer: Self-pay

## 2023-02-03 MED ORDER — BUSPIRONE HCL 7.5 MG PO TABS
7.5000 mg | ORAL_TABLET | Freq: Two times a day (BID) | ORAL | 2 refills | Status: DC
  Filled 2023-02-03: qty 60, 30d supply, fill #0
  Filled 2023-03-12: qty 60, 30d supply, fill #1
  Filled 2023-04-17: qty 60, 30d supply, fill #2

## 2023-02-04 ENCOUNTER — Other Ambulatory Visit (HOSPITAL_COMMUNITY): Payer: Self-pay

## 2023-02-19 ENCOUNTER — Other Ambulatory Visit (HOSPITAL_COMMUNITY): Payer: Self-pay

## 2023-02-21 ENCOUNTER — Other Ambulatory Visit (HOSPITAL_COMMUNITY): Payer: Self-pay

## 2023-03-02 ENCOUNTER — Other Ambulatory Visit (HOSPITAL_COMMUNITY): Payer: Self-pay

## 2023-03-14 ENCOUNTER — Other Ambulatory Visit: Payer: Self-pay

## 2023-03-14 ENCOUNTER — Other Ambulatory Visit (HOSPITAL_COMMUNITY): Payer: Self-pay

## 2023-03-15 ENCOUNTER — Other Ambulatory Visit (HOSPITAL_COMMUNITY): Payer: Self-pay

## 2023-03-15 ENCOUNTER — Other Ambulatory Visit: Payer: Self-pay

## 2023-03-24 ENCOUNTER — Other Ambulatory Visit (HOSPITAL_COMMUNITY): Payer: Self-pay

## 2023-03-24 ENCOUNTER — Encounter: Payer: Self-pay | Admitting: Medical

## 2023-03-28 ENCOUNTER — Telehealth: Payer: Self-pay | Admitting: Adult Health

## 2023-03-28 NOTE — Telephone Encounter (Signed)
Called patient to reschedule patient's appointment, patient states he still has not gotten his mouth piece from Dr. Myrtis Ser- patient states he will call to see what is going on before scheduling another appointment. Can we follow up with this order?

## 2023-03-30 NOTE — Telephone Encounter (Signed)
LM on VM to let patient know he will need to contact Dr. Myrtis Ser office to get an update regarding the mouthpiece for OSA treatment.  Will close this encounter at this time.

## 2023-03-31 ENCOUNTER — Other Ambulatory Visit: Payer: Self-pay

## 2023-03-31 ENCOUNTER — Encounter: Payer: Self-pay | Admitting: Medical

## 2023-03-31 ENCOUNTER — Ambulatory Visit (INDEPENDENT_AMBULATORY_CARE_PROVIDER_SITE_OTHER): Payer: 59 | Admitting: Medical

## 2023-03-31 VITALS — BP 114/81 | HR 68 | Temp 98.4°F | Resp 16 | Ht 70.0 in | Wt 219.1 lb

## 2023-03-31 DIAGNOSIS — F32A Depression, unspecified: Secondary | ICD-10-CM | POA: Diagnosis not present

## 2023-03-31 DIAGNOSIS — L989 Disorder of the skin and subcutaneous tissue, unspecified: Secondary | ICD-10-CM

## 2023-03-31 DIAGNOSIS — G4700 Insomnia, unspecified: Secondary | ICD-10-CM

## 2023-03-31 DIAGNOSIS — F419 Anxiety disorder, unspecified: Secondary | ICD-10-CM

## 2023-03-31 DIAGNOSIS — Z1283 Encounter for screening for malignant neoplasm of skin: Secondary | ICD-10-CM

## 2023-03-31 DIAGNOSIS — Z0000 Encounter for general adult medical examination without abnormal findings: Secondary | ICD-10-CM | POA: Diagnosis not present

## 2023-03-31 LAB — COMPREHENSIVE METABOLIC PANEL
ALT: 24 U/L (ref 0–53)
AST: 16 U/L (ref 0–37)
Albumin: 4.6 g/dL (ref 3.5–5.2)
Alkaline Phosphatase: 61 U/L (ref 39–117)
BUN: 20 mg/dL (ref 6–23)
CO2: 28 mEq/L (ref 19–32)
Calcium: 9.7 mg/dL (ref 8.4–10.5)
Chloride: 103 mEq/L (ref 96–112)
Creatinine, Ser: 1.1 mg/dL (ref 0.40–1.50)
GFR: 82.45 mL/min (ref >60.00)
Glucose, Bld: 102 mg/dL — ABNORMAL HIGH (ref 70–99)
Potassium: 4.3 mEq/L (ref 3.5–5.1)
Sodium: 140 mEq/L (ref 135–145)
Total Bilirubin: 0.7 mg/dL (ref 0.2–1.2)
Total Protein: 7.2 g/dL (ref 6.0–8.3)

## 2023-03-31 LAB — CBC WITH DIFFERENTIAL/PLATELET
Basophils Absolute: 0 10*3/uL (ref 0.0–0.1)
Basophils Relative: 0.7 % (ref 0.0–3.0)
Eosinophils Absolute: 0.2 10*3/uL (ref 0.0–0.7)
Eosinophils Relative: 3.6 % (ref 0.0–5.0)
HCT: 43.7 % (ref 39.0–52.0)
Hemoglobin: 14.9 g/dL (ref 13.0–17.0)
Lymphocytes Relative: 31.3 % (ref 12.0–46.0)
Lymphs Abs: 2.1 10*3/uL (ref 0.7–4.0)
MCHC: 34.1 g/dL (ref 30.0–36.0)
MCV: 87 fl (ref 78.0–100.0)
Monocytes Absolute: 0.5 10*3/uL (ref 0.1–1.0)
Monocytes Relative: 7.8 % (ref 3.0–12.0)
Neutro Abs: 3.8 10*3/uL (ref 1.4–7.7)
Neutrophils Relative %: 56.6 % (ref 43.0–77.0)
Platelets: 173 10*3/uL (ref 150.0–400.0)
RBC: 5.02 Mil/uL (ref 4.22–5.81)
RDW: 13.2 % (ref 11.5–15.5)
WBC: 6.6 10*3/uL (ref 4.0–10.5)

## 2023-03-31 LAB — LIPID PANEL
Cholesterol: 168 mg/dL (ref 0–200)
HDL: 37.3 mg/dL — ABNORMAL LOW (ref >39.00)
LDL Cholesterol: 105 mg/dL — ABNORMAL HIGH (ref 0–99)
NonHDL: 130.52
Total CHOL/HDL Ratio: 4
Triglycerides: 126 mg/dL (ref 0.0–149.0)
VLDL: 25.2 mg/dL (ref 0.0–40.0)

## 2023-03-31 MED ORDER — ALPRAZOLAM 0.5 MG PO TABS
0.5000 mg | ORAL_TABLET | Freq: Every day | ORAL | 0 refills | Status: DC | PRN
  Filled 2023-03-31: qty 14, 14d supply, fill #0

## 2023-03-31 NOTE — Patient Instructions (Addendum)
For you wellness exam today I have ordered cbc, cmp and lipid panel.  Vaccine up to date.   Recommend exercise and healthy diet.  We will let you know lab results as they come in.  Follow up date appointment will be determined after lab review.    For anxiety, depression and insomnia continue effexor, buspar, trazadone and rare sporadic use of xanax. Since xanax use so rare and sporadic not requiring contract presently. If more frequent use may require.  Preventive Care 61-80 Years Old, Male Preventive care refers to lifestyle choices and visits with your health care provider that can promote health and wellness. Preventive care visits are also called wellness exams. What can I expect for my preventive care visit? Counseling During your preventive care visit, your health care provider may ask about your: Medical history, including: Past medical problems. Family medical history. Current health, including: Emotional well-being. Home life and relationship well-being. Sexual activity. Lifestyle, including: Alcohol, nicotine or tobacco, and drug use. Access to firearms. Diet, exercise, and sleep habits. Safety issues such as seatbelt and bike helmet use. Sunscreen use. Work and work Astronomer. Physical exam Your health care provider will check your: Height and weight. These may be used to calculate your BMI (body mass index). BMI is a measurement that tells if you are at a healthy weight. Waist circumference. This measures the distance around your waistline. This measurement also tells if you are at a healthy weight and may help predict your risk of certain diseases, such as type 2 diabetes and high blood pressure. Heart rate and blood pressure. Body temperature. Skin for abnormal spots. What immunizations do I need?  Vaccines are usually given at various ages, according to a schedule. Your health care provider will recommend vaccines for you based on your age, medical history,  and lifestyle or other factors, such as travel or where you work. What tests do I need? Screening Your health care provider may recommend screening tests for certain conditions. This may include: Lipid and cholesterol levels. Diabetes screening. This is done by checking your blood sugar (glucose) after you have not eaten for a while (fasting). Hepatitis B test. Hepatitis C test. HIV (human immunodeficiency virus) test. STI (sexually transmitted infection) testing, if you are at risk. Lung cancer screening. Prostate cancer screening. Colorectal cancer screening. Talk with your health care provider about your test results, treatment options, and if necessary, the need for more tests. Follow these instructions at home: Eating and drinking  Eat a diet that includes fresh fruits and vegetables, whole grains, lean protein, and low-fat dairy products. Take vitamin and mineral supplements as recommended by your health care provider. Do not drink alcohol if your health care provider tells you not to drink. If you drink alcohol: Limit how much you have to 0-2 drinks a day. Know how much alcohol is in your drink. In the [place] one drink equals one 12 oz bottle of beer (355 mL), one 5 oz glass of wine (148 mL), or one 1 oz glass of hard liquor (44 mL). Lifestyle Brush your teeth every morning and night with fluoride toothpaste. Floss one time each day. Exercise for at least 30 minutes 5 or more days each week. Do not use any products that contain nicotine or tobacco. These products include cigarettes, chewing tobacco, and vaping devices, such as e-cigarettes. If you need help quitting, ask your health care provider. Do not use drugs. If you are sexually active, practice safe sex. Use a condom or other  form of protection to prevent STIs. Take aspirin only as told by your health care provider. Make sure that you understand how much to take and what form to take. Work with your health care provider to  find out whether it is safe and beneficial for you to take aspirin daily. Find healthy ways to manage stress, such as: Meditation, yoga, or listening to music. Journaling. Talking to a trusted person. Spending time with friends and family. Minimize exposure to UV radiation to reduce your risk of skin cancer. Safety Always wear your seat belt while driving or riding in a vehicle. Do not drive: If you have been drinking alcohol. Do not ride with someone who has been drinking. When you are tired or distracted. While texting. If you have been using any mind-altering substances or drugs. Wear a helmet and other protective equipment during sports activities. If you have firearms in your house, make sure you follow all gun safety procedures. What's next? Go to your health care provider once a year for an annual wellness visit. Ask your health care provider how often you should have your eyes and teeth checked. Stay up to date on all vaccines. This information is not intended to replace advice given to you by your health care provider. Make sure you discuss any questions you have with your health care provider. Document Revised: 04/08/2021 Document Reviewed: 04/08/2021 Elsevier Patient Education  2024 [employer].

## 2023-03-31 NOTE — Progress Notes (Signed)
Subjective:    Patient ID: Jerry Lopez, male    DOB: [birth year], 42 y.o.   MRN: [id number]  HPI Pt in for wellness exam and follow up on  anxiety, insomnia  and depression. He is fasting. Pt has been smoking about 2 cigarettes a day. He plans to get back on patches. No exercise. Admits not eating healthy. 2-3 mountain dew a day.  Rare alcohol use   Hiv screen declined. No risk factors to test hep c testing.   Pt states mood well controlled with effexor. Buspar has helped a lot with anxiety. Trazadone helping pt sleep.   He reports extreme rare use of xanax. About once a month.   Phq-9 score 4. Gad 7 score- 7.       Review of Systems  Constitutional:  Negative for chills, fatigue and fever.  HENT:  Negative for congestion and ear discharge.   Respiratory:  Negative for cough, chest tightness, shortness of breath and wheezing.   Cardiovascular:  Negative for chest pain and palpitations.  Gastrointestinal:  Negative for abdominal pain and blood in stool.  Genitourinary:  Negative for dysuria, frequency and penile pain.  Musculoskeletal:  Negative for back pain and joint swelling.  Neurological:  Negative for dizziness, numbness and headaches.  Hematological:  Negative for adenopathy.  Psychiatric/Behavioral:  Positive for dysphoric mood and sleep disturbance. Negative for suicidal ideas. The patient is nervous/anxious.        Controlled with meds.    Past Medical History:  Diagnosis Date   Anxiety    Arthritis    Chest pain around 12-02-2014   GERD (gastroesophageal reflux disease)    Hypercholesteremia      Social History   Socioeconomic History   Marital status: Married    Spouse name: Not on file   Number of children: Not on file   Years of education: Not on file   Highest education level: Not on file  Occupational History   Not on file  Tobacco Use   Smoking status: Some Days    Packs/day: 0.50    Years: 5.00    Additional pack years: 0.00    Total  pack years: 2.50    Types: Cigarettes    Last attempt to quit: 12/26/2015    Years since quitting: 7.2   Smokeless tobacco: Never   Tobacco comments:    Stopped smoking again on 10/04/22 wearing patches 14 mg, hfb  Vaping Use   Vaping Use: Never used  Substance and Sexual Activity   Alcohol use: Yes    Alcohol/week: 6.0 standard drinks of alcohol    Types: 6 Cans of beer per week    Comment: occasional   Drug use: No   Sexual activity: Yes    Birth control/protection: None  Other Topics Concern   Not on file  Social History Narrative   Not on file   Social Determinants of Health   Financial Resource Strain: Not on file  Food Insecurity: Not on file  Transportation Needs: Not on file  Physical Activity: Not on file  Stress: Not on file  Social Connections: Not on file  Intimate Partner Violence: Not on file    Past Surgical History:  Procedure Laterality Date   ELBOW FRACTURE SURGERY Right 07.16.14   Radial Repair   INGUINAL HERNIA REPAIR Right 01/06/2015   Procedure: LAPAROSCOPIC REPAIR OF RIGHT INGUINAL HERNIA WITH MESH;  Surgeon: Gaynelle Adu, MD;  Location: [facility];  Service: General;  Laterality: Right;  INSERTION OF MESH Right 01/06/2015   Procedure: INSERTION OF MESH;  Surgeon: Gaynelle Adu, MD;  Location: [facility];  Service: General;  Laterality: Right;   LEG SURGERY Right    lower, for fracture age 26   VASECTOMY     VASECTOMY REVERSAL      Family History  Problem Relation Age of Onset   Healthy Mother        Living   Hypertension Father        Living   Non-Hodgkin's lymphoma Father    Diabetes Father    Hypertension Other        Paternal side   Heart attack Paternal Grandfather    Healthy Sister        x1   Healthy Daughter        x3    No Known Allergies  Current Outpatient Medications on File Prior to Visit  Medication Sig Dispense Refill   ALPRAZolam (XANAX) 0.5 MG tablet Take 1 tablet (0.5 mg total) by mouth daily as needed for severe anxiety  or insomnia (max of 1 tablet per day). 16 tablet 0   atorvastatin (LIPITOR) 20 MG tablet Take 1 tablet by mouth daily. 90 tablet 0   baclofen (LIORESAL) 20 MG tablet Take 1 tablet (20 mg total) by mouth 3 (three) times daily. 30 each 0   busPIRone (BUSPAR) 7.5 MG tablet Take 1 tablet (7.5 mg total) by mouth 2 (two) times daily. 60 tablet 2   fenofibrate 160 MG tablet Take 1 tablet (160 mg total) by mouth daily. 90 tablet 1   pantoprazole (PROTONIX) 20 MG tablet Take 1 tablet (20 mg total) by mouth daily. 30 tablet 0   traZODone (DESYREL) 50 MG tablet Take 1 tablet by mouth at bedtime. 30 tablet 11   venlafaxine XR (EFFEXOR XR) 37.5 MG 24 hr capsule Take 1 capsule (37.5 mg total) by mouth daily with breakfast. 30 capsule 2   meloxicam (MOBIC) 15 MG tablet Take 1 tablet (15 mg total) by mouth daily as needed for pain. (Patient not taking: Reported on 12/29/2022) 30 tablet 0   No current facility-administered medications on file prior to visit.    BP 114/81   Pulse 68   Temp 98.4 F (36.9 C) (Oral)   Resp 16   Ht 5\' 10"  (1.778 m)   Wt 219 lb 2 oz (99.4 kg)   SpO2 98%   BMI 31.44 kg/m        Objective:   Physical Exam  General Mental Status- Alert. General Appearance- Not in acute distress.   Skin General: Color- Normal Color. Moisture- Normal Moisture.  Neck Carotid Arteries- Normal color. Moisture- Normal Moisture. No carotid bruits. No JVD.  Chest and Lung Exam Auscultation: Breath Sounds:-Normal.  Cardiovascular Auscultation:Rythm- Regular. Murmurs & Other Heart Sounds:Auscultation of the heart reveals- No Murmurs.  Abdomen Inspection:-Inspeection Normal. Palpation/Percussion:Note:No mass. Palpation and Percussion of the abdomen reveal- Non Tender, Non Distended + BS, no rebound or guarding.   Neurologic Cranial Nerve exam:- CN III-XII intact(No nystagmus), symmetric smile. Strength:- 5/5 equal and symmetric strength both upper and lower extremities.    Derm-left  upper back mole that pt thinks has changed per his wife report. Otherwise small moles and freckles scattered.     Assessment & Plan:   Patient Instructions  For you wellness exam today I have ordered cbc, cmp and lipid panel.  Vaccine up to date.   Recommend exercise and healthy diet.  We will let you know  lab results as they come in.  Follow up date appointment will be determined after lab review.    For anxiety, depression and insomnia continue effexor, buspar, trazadone and rare sporadic use of xanax. Since xanax use so rare and sporadic not requiring contract presently. If more frequent use may require.     [address] charge as did address anxiety, depression and insomnia. Refilled pt xanax.   Esperanza Richters, PA-C

## 2023-04-01 ENCOUNTER — Other Ambulatory Visit (HOSPITAL_COMMUNITY): Payer: Self-pay

## 2023-04-01 ENCOUNTER — Other Ambulatory Visit: Payer: Self-pay | Admitting: *Deleted

## 2023-04-01 ENCOUNTER — Ambulatory Visit (INDEPENDENT_AMBULATORY_CARE_PROVIDER_SITE_OTHER): Payer: 59

## 2023-04-01 DIAGNOSIS — R739 Hyperglycemia, unspecified: Secondary | ICD-10-CM | POA: Diagnosis not present

## 2023-04-01 LAB — HEMOGLOBIN A1C: Hgb A1c MFr Bld: 5.8 % (ref 4.6–6.5)

## 2023-04-03 ENCOUNTER — Other Ambulatory Visit: Payer: Self-pay | Admitting: Medical

## 2023-04-03 DIAGNOSIS — E785 Hyperlipidemia, unspecified: Secondary | ICD-10-CM

## 2023-04-04 ENCOUNTER — Other Ambulatory Visit: Payer: Self-pay

## 2023-04-04 ENCOUNTER — Other Ambulatory Visit (HOSPITAL_COMMUNITY): Payer: Self-pay

## 2023-04-04 MED ORDER — ATORVASTATIN CALCIUM 20 MG PO TABS
20.0000 mg | ORAL_TABLET | Freq: Every day | ORAL | 0 refills | Status: DC
Start: 2023-04-04 — End: 2023-07-17
  Filled 2023-04-04: qty 90, 90d supply, fill #0

## 2023-04-08 ENCOUNTER — Ambulatory Visit: Payer: 59 | Admitting: Adult Health

## 2023-04-18 ENCOUNTER — Other Ambulatory Visit (HOSPITAL_COMMUNITY): Payer: Self-pay

## 2023-04-18 ENCOUNTER — Other Ambulatory Visit: Payer: Self-pay | Admitting: Medical

## 2023-04-18 ENCOUNTER — Other Ambulatory Visit: Payer: Self-pay

## 2023-04-18 ENCOUNTER — Other Ambulatory Visit (HOSPITAL_BASED_OUTPATIENT_CLINIC_OR_DEPARTMENT_OTHER): Payer: Self-pay

## 2023-04-18 MED ORDER — VENLAFAXINE HCL ER 37.5 MG PO CP24
37.5000 mg | ORAL_CAPSULE | Freq: Every day | ORAL | 0 refills | Status: DC
  Filled 2023-04-18 (×2): qty 90, 90d supply, fill #0

## 2023-04-21 ENCOUNTER — Encounter: Payer: Self-pay | Admitting: Medical

## 2023-04-25 ENCOUNTER — Other Ambulatory Visit (HOSPITAL_COMMUNITY): Payer: Self-pay

## 2023-04-25 ENCOUNTER — Encounter: Payer: Self-pay | Admitting: Pharmacist

## 2023-04-25 ENCOUNTER — Other Ambulatory Visit: Payer: Self-pay

## 2023-04-29 ENCOUNTER — Other Ambulatory Visit: Payer: Self-pay

## 2023-04-29 ENCOUNTER — Other Ambulatory Visit (HOSPITAL_COMMUNITY): Payer: Self-pay

## 2023-05-18 ENCOUNTER — Other Ambulatory Visit: Payer: Self-pay | Admitting: Medical

## 2023-05-19 ENCOUNTER — Other Ambulatory Visit: Payer: Self-pay

## 2023-05-19 ENCOUNTER — Other Ambulatory Visit (HOSPITAL_COMMUNITY): Payer: Self-pay

## 2023-05-19 MED ORDER — BUSPIRONE HCL 7.5 MG PO TABS
7.5000 mg | ORAL_TABLET | Freq: Two times a day (BID) | ORAL | 2 refills | Status: DC
  Filled 2023-05-19: qty 60, 30d supply, fill #0
  Filled 2023-06-23: qty 60, 30d supply, fill #1
  Filled 2023-07-31: qty 60, 30d supply, fill #2

## 2023-05-27 ENCOUNTER — Other Ambulatory Visit (HOSPITAL_COMMUNITY): Payer: Self-pay

## 2023-05-31 ENCOUNTER — Ambulatory Visit: Payer: 59 | Admitting: Medical

## 2023-05-31 ENCOUNTER — Encounter: Payer: Self-pay | Admitting: Medical

## 2023-05-31 NOTE — Telephone Encounter (Signed)
Noted & informed front desk staff

## 2023-06-02 ENCOUNTER — Encounter (HOSPITAL_BASED_OUTPATIENT_CLINIC_OR_DEPARTMENT_OTHER): Payer: Self-pay | Admitting: Emergency Medicine

## 2023-06-02 ENCOUNTER — Emergency Department (HOSPITAL_BASED_OUTPATIENT_CLINIC_OR_DEPARTMENT_OTHER)
Admission: EM | Admit: 2023-06-02 | Discharge: 2023-06-02 | Disposition: A | Discharge status: Home / Self Care | Payer: 59 | Attending: Emergency Medicine | Admitting: Emergency Medicine

## 2023-06-02 ENCOUNTER — Emergency Department (HOSPITAL_BASED_OUTPATIENT_CLINIC_OR_DEPARTMENT_OTHER): Payer: 59

## 2023-06-02 ENCOUNTER — Other Ambulatory Visit (HOSPITAL_COMMUNITY): Payer: Self-pay

## 2023-06-02 ENCOUNTER — Other Ambulatory Visit: Payer: Self-pay

## 2023-06-02 DIAGNOSIS — R1011 Right upper quadrant pain: Secondary | ICD-10-CM | POA: Insufficient documentation

## 2023-06-02 DIAGNOSIS — K820 Obstruction of gallbladder: Secondary | ICD-10-CM | POA: Diagnosis not present

## 2023-06-02 DIAGNOSIS — K219 Gastro-esophageal reflux disease without esophagitis: Secondary | ICD-10-CM | POA: Diagnosis not present

## 2023-06-02 DIAGNOSIS — R110 Nausea: Secondary | ICD-10-CM | POA: Insufficient documentation

## 2023-06-02 DIAGNOSIS — K8020 Calculus of gallbladder without cholecystitis without obstruction: Secondary | ICD-10-CM | POA: Diagnosis not present

## 2023-06-02 DIAGNOSIS — K760 Fatty (change of) liver, not elsewhere classified: Secondary | ICD-10-CM | POA: Diagnosis not present

## 2023-06-02 LAB — CBC
HCT: 42.6 % (ref 39.0–52.0)
Hemoglobin: 15 g/dL (ref 13.0–17.0)
MCH: 29.8 pg (ref 26.0–34.0)
MCHC: 35.2 g/dL (ref 30.0–36.0)
MCV: 84.5 fL (ref 80.0–100.0)
Platelets: 169 10*3/uL (ref 150–400)
RBC: 5.04 MIL/uL (ref 4.22–5.81)
RDW: 12.5 % (ref 11.5–15.5)
WBC: 7.6 10*3/uL (ref 4.0–10.5)
nRBC: 0 % (ref 0.0–0.2)

## 2023-06-02 LAB — COMPREHENSIVE METABOLIC PANEL
ALT: 23 U/L (ref 0–44)
AST: 16 U/L (ref 15–41)
Albumin: 4.8 g/dL (ref 3.5–5.0)
Alkaline Phosphatase: 55 U/L (ref 38–126)
Anion gap: 10 (ref 5–15)
BUN: 21 mg/dL — ABNORMAL HIGH (ref 6–20)
CO2: 29 mmol/L (ref 22–32)
Calcium: 9.4 mg/dL (ref 8.9–10.3)
Chloride: 100 mmol/L (ref 98–111)
Creatinine, Ser: 1.15 mg/dL (ref 0.61–1.24)
GFR, Estimated: >60 mL/min (ref >60)
Glucose, Bld: 126 mg/dL — ABNORMAL HIGH (ref 70–99)
Potassium: 3.7 mmol/L (ref 3.5–5.1)
Sodium: 139 mmol/L (ref 135–145)
Total Bilirubin: 0.5 mg/dL (ref 0.3–1.2)
Total Protein: 7.2 g/dL (ref 6.5–8.1)

## 2023-06-02 LAB — URINALYSIS, ROUTINE W REFLEX MICROSCOPIC
Bilirubin Urine: NEGATIVE
Glucose, UA: NEGATIVE mg/dL
Hgb urine dipstick: NEGATIVE
Ketones, ur: NEGATIVE mg/dL
Leukocytes,Ua: NEGATIVE
Nitrite: NEGATIVE
Protein, ur: NEGATIVE mg/dL
Specific Gravity, Urine: 1.02 (ref 1.005–1.030)
pH: 7.5 (ref 5.0–8.0)

## 2023-06-02 LAB — I-STAT CHEM 8, ED
BUN: 20 mg/dL (ref 6–20)
Calcium, Ion: 1.2 mmol/L (ref 1.15–1.40)
Chloride: 100 mmol/L (ref 98–111)
Creatinine, Ser: 1.2 mg/dL (ref 0.61–1.24)
Glucose, Bld: 125 mg/dL — ABNORMAL HIGH (ref 70–99)
HCT: 40 % (ref 39.0–52.0)
Hemoglobin: 13.6 g/dL (ref 13.0–17.0)
Potassium: 3.8 mmol/L (ref 3.5–5.1)
Sodium: 139 mmol/L (ref 135–145)
TCO2: 25 mmol/L (ref 22–32)

## 2023-06-02 LAB — LIPASE, BLOOD: Lipase: 81 U/L — ABNORMAL HIGH (ref 11–51)

## 2023-06-02 MED ORDER — DICYCLOMINE HCL 20 MG PO TABS
20.0000 mg | ORAL_TABLET | Freq: Three times a day (TID) | ORAL | 0 refills | Status: DC | PRN
  Filled 2023-06-02: qty 20, 7d supply, fill #0

## 2023-06-02 MED ORDER — PANTOPRAZOLE SODIUM 40 MG PO TBEC
40.0000 mg | DELAYED_RELEASE_TABLET | Freq: Every day | ORAL | 0 refills | Status: DC
  Filled 2023-06-02: qty 20, 20d supply, fill #0

## 2023-06-02 MED ORDER — IOHEXOL 300 MG/ML  SOLN
100.0000 mL | Freq: Once | INTRAMUSCULAR | Status: AC | PRN
  Administered 2023-06-02: 100 mL via INTRAVENOUS

## 2023-06-02 MED ORDER — ONDANSETRON 4 MG PO TBDP
4.0000 mg | ORAL_TABLET | Freq: Three times a day (TID) | ORAL | 0 refills | Status: AC | PRN
  Filled 2023-06-02: qty 20, 7d supply, fill #0

## 2023-06-02 NOTE — ED Triage Notes (Signed)
Patient arrived via POV c/o RUQ abdominal pain intermittently for "months", worse x 3 hrs. Patient states being nauseated. Patient denies emesis. Patient states 5/10 pain. Patient is AO x 4, VS WDL, normal gait.

## 2023-06-02 NOTE — ED Provider Notes (Signed)
[place] [facility] AT [facility] Provider Note   CSN: [id number] Arrival date & time: 06/02/23  2015  History Past Medical History:  Diagnosis Date   Anxiety    Arthritis    Chest pain around 12-02-2014   GERD (gastroesophageal reflux disease)    Hypercholesteremia     Chief Complaint  Patient presents with   Abdominal Pain    Jerry Lopez is a 42 y.o. male.  Jerry Lopez is a 42 year old male presenting to the ED with right upper quadrant pain.  For the past 2 months, he has endorsed pain localized to his right upper quadrant region that worsens after eating.  Today, he noticed the pain worsened after eating roughly 3 hours ago.  The pain was associated with nausea which has not happened before.  He describes the pain as waxing and waning.  He denies any radiation of the pain. He denies any vomiting, chest pain, dyspnea, or other signs or symptoms.  He has had no changes to his stool or urination during this time.   The history is provided by the patient.  Abdominal Pain Pain location:  RUQ Pain quality: sharp   Pain radiates to:  Does not radiate Pain severity:  Moderate Onset quality:  Sudden Duration:  3 hours Timing:  Intermittent Progression:  Waxing and waning Chronicity:  Chronic Context: eating   Relieved by:  Nothing Associated symptoms: nausea    Home Medications Prior to Admission medications   Medication Sig Start Date End Date Taking? Authorizing Provider  ALPRAZolam Prudy Feeler) 0.5 MG tablet Take 1 tablet (0.5 mg total) by mouth daily as needed for severe anxiety or insomnia (max of 1 tablet per day). 03/31/23   Saguier, Ramon Dredge, PA-C  atorvastatin (LIPITOR) 20 MG tablet Take 1 tablet by mouth daily. 04/04/23   Saguier, Ramon Dredge, PA-C  baclofen (LIORESAL) 20 MG tablet Take 1 tablet (20 mg total) by mouth 3 (three) times daily. 12/29/22   Eustace Moore, MD  busPIRone (BUSPAR) 7.5 MG tablet Take 1 tablet (7.5 mg total) by mouth 2 (two)  times daily. 05/19/23   Saguier, Ramon Dredge, PA-C  fenofibrate 160 MG tablet Take 1 tablet (160 mg total) by mouth daily. 11/22/22 11/22/23  Saguier, Ramon Dredge, PA-C  meloxicam (MOBIC) 15 MG tablet Take 1 tablet (15 mg total) by mouth daily as needed for pain. Patient not taking: Reported on 12/29/2022 11/16/22   Vivi Barrack, DPM  pantoprazole (PROTONIX) 20 MG tablet Take 1 tablet (20 mg total) by mouth daily. 12/29/22   Eustace Moore, MD  traZODone (DESYREL) 50 MG tablet Take 1 tablet by mouth at bedtime. 07/16/22   Saguier, Ramon Dredge, PA-C  venlafaxine XR (EFFEXOR XR) 37.5 MG 24 hr capsule Take 1 capsule (37.5 mg total) by mouth daily with breakfast. 04/18/23   Saguier, Ramon Dredge, PA-C     Allergies    Patient has no known allergies.    Review of Systems   Review of Systems  Constitutional: Negative.   HENT: Negative.    Eyes: Negative.   Respiratory: Negative.    Cardiovascular: Negative.   Gastrointestinal:  Positive for abdominal pain and nausea.    Physical Exam Updated Vital Signs BP (!) 142/88   Pulse 71   Temp 98.1 F (36.7 C) (Oral)   Resp 16   Ht 5\' 9"  (1.753 m)   Wt 99.8 kg   SpO2 100%   BMI 32.49 kg/m  Physical Exam  ED Results / Procedures / Treatments  Labs (all labs ordered are listed, but only abnormal results are displayed) Labs Reviewed  I-STAT CHEM 8, ED - Abnormal; Notable for the following components:      Result Value   Glucose, Bld 125 (*)    All other components within normal limits  CBC  URINALYSIS, ROUTINE W REFLEX MICROSCOPIC  LIPASE, BLOOD  COMPREHENSIVE METABOLIC PANEL    EKG None  Radiology CT ABDOMEN PELVIS W CONTRAST  Result Date: 06/02/2023 CLINICAL DATA:  Cholelithiasis, right upper quadrant pain. EXAM: CT ABDOMEN AND PELVIS WITH CONTRAST TECHNIQUE: Multidetector CT imaging of the abdomen and pelvis was performed using the standard protocol following bolus administration of intravenous contrast. RADIATION DOSE REDUCTION: This exam was  performed according to the departmental dose-optimization program which includes automated exposure control, adjustment of the mA and/or kV according to patient size and/or use of iterative reconstruction technique. CONTRAST:  OMNIPAQUE IOHEXOL 300 MG/ML  SOLN COMPARISON:  Abdominal ultrasound same day. CT renal stone 11/08/2018 FINDINGS: Lower chest: No acute abnormality. Hepatobiliary: There is diffuse fatty infiltration of the liver. Gallbladder is contracted, but within normal limits. There is no biliary ductal dilatation. Pancreas: Unremarkable. No pancreatic ductal dilatation or surrounding inflammatory changes. Spleen: Normal in size without focal abnormality. Adrenals/Urinary Tract: Adrenal glands are unremarkable. Kidneys are normal, without renal calculi, focal lesion, or hydronephrosis. Bladder is unremarkable. Stomach/Bowel: Stomach is within normal limits. Appendix appears normal. No evidence of bowel wall thickening, distention, or inflammatory changes. Vascular/Lymphatic: No significant vascular findings are present. No enlarged abdominal or pelvic lymph nodes. Reproductive: Prostate is unremarkable. Other: No abdominal wall hernia or abnormality. No abdominopelvic ascites. Musculoskeletal: No acute or significant osseous findings. IMPRESSION: 1. No acute localizing process in the abdomen or pelvis. 2. Fatty infiltration of the liver. Electronically Signed   By: Darliss Cheney M.D.   On: 06/02/2023 22:30   US Abdomen Limited RUQ (LIVER/GB)  Result Date: 06/02/2023 CLINICAL DATA:  Right upper quadrant pain EXAM: ULTRASOUND ABDOMEN LIMITED RIGHT UPPER QUADRANT COMPARISON:  CT abdomen and pelvis 11/08/2018 FINDINGS: Gallbladder: Gallbladder is contracted. No definitive gallstones or wall thickening visualized. No sonographic Murphy sign noted by sonographer. Common bile duct: Diameter: Not visualized. No intrahepatic biliary ductal dilatation. Liver: No focal lesion identified. Increase in  parenchymal echogenicity. Portal vein is patent on color Doppler imaging with normal direction of blood flow towards the liver. Other: None. IMPRESSION: Hepatic steatosis. Please note limited evaluation for focal hepatic masses in a patient with hepatic steatosis due to decreased penetration of the acoustic ultrasound waves. Electronically Signed   By: Darliss Cheney M.D.   On: 06/02/2023 21:25    Medications Ordered in ED Medications  iohexol (OMNIPAQUE) 300 MG/ML solution 100 mL (100 mLs Intravenous Contrast Given 06/02/23 2211)    ED Course/ Medical Decision Making/ A&P Patient endorses right upper quadrant pain for the past 2 months that has worsened over the past 3 hours after eating a meal.  Over the past few months, the pain is worse after eating but is now associated with nausea. He denies any radiation of the pain. RUQ [place] no visualization of definitive gallstones or wall thickening. CT abdomen pelvis largely unremarkable. Labs pending.                                Medical Decision Making Jerry Lopez is a 42 year old male presenting to the ED with right upper quadrant pain.  For the past  Amount and/or Complexity of Data Reviewed External Data Reviewed: labs and notes. Labs: ordered. Decision-making details documented in ED Course. Radiology: ordered. Decision-making details documented in ED Course.  Risk Prescription drug management.   Final Clinical Impression(s) / ED Diagnoses Final diagnoses:  Right upper quadrant abdominal pain  Nausea    Rx / DC Orders ED Discharge Orders     None         Morrie Sheldon, MD 06/02/23 2239    Maia Plan, MD 06/03/23 [id number]

## 2023-06-02 NOTE — Discharge Instructions (Signed)

## 2023-06-03 ENCOUNTER — Other Ambulatory Visit (HOSPITAL_COMMUNITY): Payer: Self-pay

## 2023-06-03 ENCOUNTER — Other Ambulatory Visit: Payer: Self-pay

## 2023-06-13 ENCOUNTER — Other Ambulatory Visit: Payer: Self-pay | Admitting: Medical

## 2023-06-13 DIAGNOSIS — E785 Hyperlipidemia, unspecified: Secondary | ICD-10-CM

## 2023-06-14 ENCOUNTER — Other Ambulatory Visit: Payer: Self-pay

## 2023-06-14 ENCOUNTER — Other Ambulatory Visit (HOSPITAL_COMMUNITY): Payer: Self-pay

## 2023-06-14 MED ORDER — FENOFIBRATE 160 MG PO TABS
160.0000 mg | ORAL_TABLET | Freq: Every day | ORAL | 1 refills | Status: DC
  Filled 2023-06-14: qty 90, 90d supply, fill #0
  Filled 2023-10-24: qty 90, 90d supply, fill #1

## 2023-06-23 ENCOUNTER — Other Ambulatory Visit (HOSPITAL_COMMUNITY): Payer: Self-pay

## 2023-06-27 ENCOUNTER — Other Ambulatory Visit (HOSPITAL_COMMUNITY): Payer: Self-pay

## 2023-06-28 ENCOUNTER — Other Ambulatory Visit (HOSPITAL_COMMUNITY): Payer: Self-pay

## 2023-07-14 ENCOUNTER — Ambulatory Visit (INDEPENDENT_AMBULATORY_CARE_PROVIDER_SITE_OTHER): Payer: 59 | Admitting: Dermatology

## 2023-07-14 ENCOUNTER — Encounter: Payer: Self-pay | Admitting: Dermatology

## 2023-07-14 VITALS — BP 125/86 | HR 90

## 2023-07-14 DIAGNOSIS — W908XXA Exposure to other nonionizing radiation, initial encounter: Secondary | ICD-10-CM

## 2023-07-14 DIAGNOSIS — D229 Melanocytic nevi, unspecified: Secondary | ICD-10-CM

## 2023-07-14 DIAGNOSIS — D485 Neoplasm of uncertain behavior of skin: Secondary | ICD-10-CM

## 2023-07-14 DIAGNOSIS — L821 Other seborrheic keratosis: Secondary | ICD-10-CM | POA: Diagnosis not present

## 2023-07-14 DIAGNOSIS — D225 Melanocytic nevi of trunk: Secondary | ICD-10-CM | POA: Diagnosis not present

## 2023-07-14 DIAGNOSIS — Z1283 Encounter for screening for malignant neoplasm of skin: Secondary | ICD-10-CM

## 2023-07-14 DIAGNOSIS — D1801 Hemangioma of skin and subcutaneous tissue: Secondary | ICD-10-CM

## 2023-07-14 DIAGNOSIS — L578 Other skin changes due to chronic exposure to nonionizing radiation: Secondary | ICD-10-CM

## 2023-07-14 DIAGNOSIS — L814 Other melanin hyperpigmentation: Secondary | ICD-10-CM | POA: Diagnosis not present

## 2023-07-14 NOTE — Patient Instructions (Addendum)

## 2023-07-14 NOTE — Progress Notes (Signed)
   New Patient Visit   Subjective  Jerry Lopez is a 42 y.o. male who presents for the following: Skin Cancer Screening and Upper Body Skin Exam  The patient presents for Upper Body Skin Exam (UBSE) for skin cancer screening and mole check. The patient has spots, moles and lesions to be evaluated, some may be new or changing and the patient may have concern these could be cancer. Pt has no hx of skin cancer, no hx of skin cancer Pt has a couple spots to have evaluated   The following portions of the chart were reviewed this encounter and updated as appropriate: medications, allergies, medical history  Review of Systems:  No other skin or systemic complaints except as noted in HPI or Assessment and Plan.  Objective  Well appearing patient in no apparent distress; mood and affect are within normal limits.  All skin waist up examined. Relevant physical exam findings are noted in the Assessment and Plan.  Mid Back Brown pedunculated papule    Assessment & Plan   Neoplasm of uncertain behavior of skin Mid Back  Skin / nail biopsy Type of biopsy: tangential   Informed consent: discussed and consent obtained   Instrument used: DermaBlade   Outcome: patient tolerated procedure well   Post-procedure details: wound care instructions given    Specimen 1 - Surgical pathology Differential Diagnosis: R/O benign nevus vs other  Check Margins: No   Skin cancer screening performed today.  Actinic Damage - Chronic condition, secondary to cumulative UV/sun exposure - diffuse scaly erythematous macules with underlying dyspigmentation - Recommend daily broad spectrum sunscreen SPF 30+ to sun-exposed areas, reapply every 2 hours as needed.  - Staying in the shade or wearing long sleeves, sun glasses (UVA+UVB protection) and wide brim hats (4-inch brim around the entire circumference of the hat) are also recommended for sun protection.  - Call for new or changing lesions.  Lentigines,  Seborrheic Keratoses, Hemangiomas - Benign normal skin lesions - Benign-appearing - Call for any changes  Melanocytic Nevi - Tan-brown and/or pink-flesh-colored symmetric macules and papules - Benign appearing on exam today - Observation - Call clinic for new or changing moles - Recommend daily use of broad spectrum spf 30+ sunscreen to sun-exposed areas.    Return in about 1 year (around 07/13/2024) for TBSE.  I, Tillie Fantasia, CMA, am acting as scribe for Gwenith Daily, MD.   Documentation: I have reviewed the above documentation for accuracy and completeness, and I agree with the above.  Gwenith Daily, MD

## 2023-07-15 LAB — SURGICAL PATHOLOGY

## 2023-07-17 ENCOUNTER — Other Ambulatory Visit: Payer: Self-pay | Admitting: Medical

## 2023-07-17 DIAGNOSIS — E785 Hyperlipidemia, unspecified: Secondary | ICD-10-CM

## 2023-07-18 ENCOUNTER — Other Ambulatory Visit: Payer: Self-pay

## 2023-07-18 ENCOUNTER — Other Ambulatory Visit (HOSPITAL_COMMUNITY): Payer: Self-pay

## 2023-07-18 MED ORDER — ATORVASTATIN CALCIUM 20 MG PO TABS
20.0000 mg | ORAL_TABLET | Freq: Every day | ORAL | 0 refills | Status: DC
Start: 2023-07-18 — End: 2023-12-02
  Filled 2023-07-18: qty 90, 90d supply, fill #0

## 2023-07-31 ENCOUNTER — Other Ambulatory Visit: Payer: Self-pay | Admitting: Medical

## 2023-07-31 ENCOUNTER — Other Ambulatory Visit (HOSPITAL_COMMUNITY): Payer: Self-pay

## 2023-07-31 DIAGNOSIS — F5101 Primary insomnia: Secondary | ICD-10-CM

## 2023-08-01 ENCOUNTER — Other Ambulatory Visit: Payer: Self-pay

## 2023-08-01 ENCOUNTER — Other Ambulatory Visit (HOSPITAL_COMMUNITY): Payer: Self-pay

## 2023-08-01 MED ORDER — VENLAFAXINE HCL ER 37.5 MG PO CP24
37.5000 mg | ORAL_CAPSULE | Freq: Every day | ORAL | 0 refills | Status: DC
  Filled 2023-08-01: qty 90, 90d supply, fill #0

## 2023-08-01 MED ORDER — TRAZODONE HCL 50 MG PO TABS
50.0000 mg | ORAL_TABLET | Freq: Every day | ORAL | 0 refills | Status: DC
Start: 2023-08-01 — End: 2023-10-24
  Filled 2023-08-01: qty 90, 90d supply, fill #0

## 2023-08-03 ENCOUNTER — Other Ambulatory Visit (HOSPITAL_COMMUNITY): Payer: Self-pay

## 2023-09-18 ENCOUNTER — Other Ambulatory Visit: Payer: Self-pay | Admitting: Medical

## 2023-09-19 ENCOUNTER — Other Ambulatory Visit: Payer: Self-pay

## 2023-09-19 ENCOUNTER — Other Ambulatory Visit (HOSPITAL_COMMUNITY): Payer: Self-pay

## 2023-09-19 MED ORDER — BUSPIRONE HCL 7.5 MG PO TABS
7.5000 mg | ORAL_TABLET | Freq: Two times a day (BID) | ORAL | 2 refills | Status: DC
  Filled 2023-09-19: qty 60, 30d supply, fill #0
  Filled 2023-10-24: qty 60, 30d supply, fill #1
  Filled 2023-12-02: qty 60, 30d supply, fill #2

## 2023-10-24 ENCOUNTER — Other Ambulatory Visit: Payer: Self-pay | Admitting: Medical

## 2023-10-24 DIAGNOSIS — F5101 Primary insomnia: Secondary | ICD-10-CM

## 2023-10-25 ENCOUNTER — Other Ambulatory Visit: Payer: Self-pay

## 2023-10-25 ENCOUNTER — Other Ambulatory Visit (HOSPITAL_COMMUNITY): Payer: Self-pay

## 2023-10-25 MED ORDER — TRAZODONE HCL 50 MG PO TABS
50.0000 mg | ORAL_TABLET | Freq: Every day | ORAL | 0 refills | Status: DC
Start: 2023-10-25 — End: 2024-01-26
  Filled 2023-10-25: qty 90, 90d supply, fill #0

## 2023-11-06 ENCOUNTER — Other Ambulatory Visit: Payer: Self-pay | Admitting: Medical

## 2023-11-07 ENCOUNTER — Other Ambulatory Visit (HOSPITAL_COMMUNITY): Payer: Self-pay

## 2023-11-07 ENCOUNTER — Other Ambulatory Visit: Payer: Self-pay

## 2023-11-07 MED ORDER — VENLAFAXINE HCL ER 37.5 MG PO CP24
37.5000 mg | ORAL_CAPSULE | Freq: Every day | ORAL | 0 refills | Status: DC
  Filled 2023-11-07: qty 90, 90d supply, fill #0

## 2023-12-02 ENCOUNTER — Other Ambulatory Visit (HOSPITAL_COMMUNITY): Payer: Self-pay

## 2023-12-02 ENCOUNTER — Other Ambulatory Visit: Payer: Self-pay | Admitting: Medical

## 2023-12-02 ENCOUNTER — Other Ambulatory Visit: Payer: Self-pay

## 2023-12-02 DIAGNOSIS — E785 Hyperlipidemia, unspecified: Secondary | ICD-10-CM

## 2023-12-02 MED ORDER — ATORVASTATIN CALCIUM 20 MG PO TABS
20.0000 mg | ORAL_TABLET | Freq: Every day | ORAL | 0 refills | Status: DC
Start: 2023-12-02 — End: 2024-03-28
  Filled 2023-12-02: qty 90, 90d supply, fill #0

## 2023-12-30 ENCOUNTER — Encounter: Payer: Self-pay | Admitting: Adult Health

## 2024-01-11 ENCOUNTER — Other Ambulatory Visit: Payer: Self-pay

## 2024-01-11 ENCOUNTER — Other Ambulatory Visit (HOSPITAL_COMMUNITY): Payer: Self-pay

## 2024-01-11 ENCOUNTER — Other Ambulatory Visit: Payer: Self-pay | Admitting: Medical

## 2024-01-11 MED ORDER — BUSPIRONE HCL 7.5 MG PO TABS
7.5000 mg | ORAL_TABLET | Freq: Two times a day (BID) | ORAL | 2 refills | Status: DC
  Filled 2024-01-11: qty 60, 30d supply, fill #0
  Filled 2024-02-17 – 2024-02-27 (×2): qty 60, 30d supply, fill #1
  Filled 2024-03-28: qty 60, 30d supply, fill #2

## 2024-01-12 ENCOUNTER — Other Ambulatory Visit (HOSPITAL_COMMUNITY): Payer: Self-pay

## 2024-01-26 ENCOUNTER — Other Ambulatory Visit: Payer: Self-pay | Admitting: Medical

## 2024-01-26 DIAGNOSIS — F5101 Primary insomnia: Secondary | ICD-10-CM

## 2024-01-27 ENCOUNTER — Other Ambulatory Visit (HOSPITAL_COMMUNITY): Payer: Self-pay

## 2024-01-27 ENCOUNTER — Other Ambulatory Visit: Payer: Self-pay

## 2024-01-27 MED ORDER — TRAZODONE HCL 50 MG PO TABS
50.0000 mg | ORAL_TABLET | Freq: Every day | ORAL | 0 refills | Status: DC
Start: 2024-01-27 — End: 2024-04-23
  Filled 2024-01-27 – 2024-01-30 (×2): qty 90, 90d supply, fill #0

## 2024-01-30 ENCOUNTER — Other Ambulatory Visit (HOSPITAL_BASED_OUTPATIENT_CLINIC_OR_DEPARTMENT_OTHER): Payer: Self-pay

## 2024-01-30 ENCOUNTER — Other Ambulatory Visit (HOSPITAL_COMMUNITY): Payer: Self-pay

## 2024-02-17 ENCOUNTER — Other Ambulatory Visit: Payer: Self-pay | Admitting: Medical

## 2024-02-17 DIAGNOSIS — E785 Hyperlipidemia, unspecified: Secondary | ICD-10-CM

## 2024-02-18 ENCOUNTER — Other Ambulatory Visit (HOSPITAL_COMMUNITY): Payer: Self-pay

## 2024-02-20 ENCOUNTER — Other Ambulatory Visit: Payer: Self-pay

## 2024-02-20 ENCOUNTER — Other Ambulatory Visit (HOSPITAL_COMMUNITY): Payer: Self-pay

## 2024-02-20 MED ORDER — FENOFIBRATE 160 MG PO TABS
160.0000 mg | ORAL_TABLET | Freq: Every day | ORAL | 1 refills | Status: AC
  Filled 2024-02-20: qty 90, 90d supply, fill #0
  Filled 2024-06-24: qty 90, 90d supply, fill #1

## 2024-02-26 ENCOUNTER — Other Ambulatory Visit: Payer: Self-pay | Admitting: Family Medicine

## 2024-02-27 ENCOUNTER — Other Ambulatory Visit: Payer: Self-pay

## 2024-02-27 ENCOUNTER — Other Ambulatory Visit (HOSPITAL_COMMUNITY): Payer: Self-pay

## 2024-02-27 MED ORDER — VENLAFAXINE HCL ER 37.5 MG PO CP24
37.5000 mg | ORAL_CAPSULE | Freq: Every day | ORAL | 0 refills | Status: DC
  Filled 2024-02-27: qty 90, 90d supply, fill #0

## 2024-02-28 ENCOUNTER — Other Ambulatory Visit (HOSPITAL_COMMUNITY): Payer: Self-pay

## 2024-03-28 ENCOUNTER — Other Ambulatory Visit: Payer: Self-pay | Admitting: Medical

## 2024-03-28 DIAGNOSIS — E785 Hyperlipidemia, unspecified: Secondary | ICD-10-CM

## 2024-03-29 ENCOUNTER — Other Ambulatory Visit: Payer: Self-pay

## 2024-03-29 ENCOUNTER — Other Ambulatory Visit (HOSPITAL_COMMUNITY): Payer: Self-pay

## 2024-03-29 MED ORDER — ATORVASTATIN CALCIUM 20 MG PO TABS
20.0000 mg | ORAL_TABLET | Freq: Every day | ORAL | 0 refills | Status: DC
Start: 2024-03-29 — End: 2024-08-20
  Filled 2024-03-29: qty 90, 90d supply, fill #0

## 2024-04-02 ENCOUNTER — Other Ambulatory Visit: Payer: Self-pay

## 2024-04-02 ENCOUNTER — Other Ambulatory Visit (HOSPITAL_COMMUNITY): Payer: Self-pay

## 2024-04-02 ENCOUNTER — Encounter: Payer: Self-pay | Admitting: Medical

## 2024-04-02 ENCOUNTER — Ambulatory Visit (INDEPENDENT_AMBULATORY_CARE_PROVIDER_SITE_OTHER): Admitting: Medical

## 2024-04-02 VITALS — BP 137/70 | HR 73 | Temp 98.1°F | Resp 18 | Ht 69.0 in | Wt 219.4 lb

## 2024-04-02 DIAGNOSIS — Z13220 Encounter for screening for lipoid disorders: Secondary | ICD-10-CM | POA: Diagnosis not present

## 2024-04-02 DIAGNOSIS — R3911 Hesitancy of micturition: Secondary | ICD-10-CM

## 2024-04-02 DIAGNOSIS — E559 Vitamin D deficiency, unspecified: Secondary | ICD-10-CM

## 2024-04-02 DIAGNOSIS — E538 Deficiency of other specified B group vitamins: Secondary | ICD-10-CM

## 2024-04-02 DIAGNOSIS — R5383 Other fatigue: Secondary | ICD-10-CM

## 2024-04-02 DIAGNOSIS — Z0000 Encounter for general adult medical examination without abnormal findings: Secondary | ICD-10-CM

## 2024-04-02 DIAGNOSIS — R0683 Snoring: Secondary | ICD-10-CM | POA: Diagnosis not present

## 2024-04-02 DIAGNOSIS — R739 Hyperglycemia, unspecified: Secondary | ICD-10-CM

## 2024-04-02 DIAGNOSIS — Z125 Encounter for screening for malignant neoplasm of prostate: Secondary | ICD-10-CM

## 2024-04-02 MED ORDER — ALPRAZOLAM 0.5 MG PO TABS
0.5000 mg | ORAL_TABLET | Freq: Every day | ORAL | 0 refills | Status: AC | PRN
  Filled 2024-04-02 – 2024-04-24 (×2): qty 16, 16d supply, fill #0

## 2024-04-02 NOTE — Progress Notes (Signed)
 Subjective:    Patient ID: Jerry Lopez, male    DOB: Mar 02, 1981, 43 y.o.   MRN: [id number]  HPI Pt in for wellness exam. He is fasting.  Pt has been smoking about 4-5 cigarettes a day. He planned to get back on patches last year but never did. No exercise. Admits not eating healthy. 2-3 mountain dew a day.  Rare alcohol use  Pt declined pcv 20 vaccine.     Hiv screen declined and hep c again declined.   Pt has some daily fatigue- He thinks fatigue related to no exercise and poor diet. Hx of low b12 in the past. He is on b12 gummies oc. On review pt does snore. Pt had dx of sleep apnea in the past. Pt states in past was measured for mouth piece. He was never told if mouth piece was covered by insurance.  Also low vit d in the past.    Follow up on  anxiety, insomnia  and depression. Mood, anxiety and insomnia controlled with effexor , buspar  and trazadone.   When dad passed mood and anxiety worsened. Used 4 xanax  in one week. But overall for entire year just use 16 tabs.        Review of Systems  Constitutional:  Positive for fatigue. Negative for chills.  HENT:  Negative for congestion, ear pain and mouth sores.        Snores.  Respiratory:  Negative for cough, choking, shortness of breath and wheezing.   Cardiovascular:  Negative for chest pain and palpitations.  Gastrointestinal:  Negative for abdominal pain and constipation.  Genitourinary:  Negative for dysuria, frequency, penile pain, scrotal swelling and urgency.       Some mild intetmittent urinary hesitancy.  Musculoskeletal:  Negative for back pain and neck pain.  Skin:  Negative for pallor and rash.  Neurological:  Negative for dizziness, speech difficulty, weakness and numbness.  Hematological:  Negative for adenopathy. Does not bruise/bleed easily.  Psychiatric/Behavioral:  Positive for dysphoric mood and sleep disturbance. Negative for confusion and suicidal ideas. The patient is nervous/anxious.      Past Medical History:  Diagnosis Date   Anxiety    Arthritis    Chest pain around 12-02-2014   GERD (gastroesophageal reflux disease)    Hypercholesteremia      Social History   Socioeconomic History   Marital status: Married    Spouse name: Not on file   Number of children: Not on file   Years of education: Not on file   Highest education level: Some college, no degree  Occupational History   Not on file  Tobacco Use   Smoking status: Some Days    Current packs/day: 0.00    Average packs/day: 0.5 packs/day for 5.0 years (2.5 ttl pk-yrs)    Types: Cigarettes    Start date: 12/26/2010    Last attempt to quit: 12/26/2015    Years since quitting: 8.2   Smokeless tobacco: Never   Tobacco comments:    Stopped smoking again on 10/04/22 wearing patches 14 mg, hfb  Vaping Use   Vaping status: Never Used  Substance and Sexual Activity   Alcohol use: Yes    Alcohol/week: 6.0 standard drinks of alcohol    Types: 6 Cans of beer per week    Comment: occasional   Drug use: No   Sexual activity: Yes    Birth control/protection: None  Other Topics Concern   Not on file  Social History Narrative   Not  on file   Social Drivers of Health   Financial Resource Strain: Low Risk  (03/26/2024)   Overall Financial Resource Strain (CARDIA)    Difficulty of Paying Living Expenses: Not very hard  Food Insecurity: No Food Insecurity (03/26/2024)   Hunger Vital Sign    Worried About Running Out of Food in the Last Year: Never true    Ran Out of Food in the Last Year: Never true  Transportation Needs: No Transportation Needs (03/26/2024)   PRAPARE - Administrator, Civil Service (Medical): No    Lack of Transportation (Non-Medical): No  Physical Activity: Insufficiently Active (05/24/2023)   Exercise Vital Sign    Days of Exercise per Week: 2 days    Minutes of Exercise per Session: 10 min  Stress: Stress Concern Present (03/26/2024)   [employer] of Occupational Health -  Occupational Stress Questionnaire    Feeling of Stress : To some extent  Social Connections: Socially Integrated (03/26/2024)   Social Connection and Isolation Panel [NHANES]    Frequency of Communication with Friends and Family: More than three times a week    Frequency of Social Gatherings with Friends and Family: More than three times a week    Attends Religious Services: More than 4 times per year    Active Member of Clubs or Organizations: Yes    Attends Engineer, structural: More than 4 times per year    Marital Status: Married  Catering manager Violence: Not on file    Past Surgical History:  Procedure Laterality Date   ELBOW FRACTURE SURGERY Right 07.16.14   Radial Repair   INGUINAL HERNIA REPAIR Right 01/06/2015   Procedure: LAPAROSCOPIC REPAIR OF RIGHT INGUINAL HERNIA WITH MESH;  Surgeon: Aldean Hummingbird, MD;  Location: [facility];  Service: General;  Laterality: Right;   INSERTION OF MESH Right 01/06/2015   Procedure: INSERTION OF MESH;  Surgeon: Aldean Hummingbird, MD;  Location: [facility];  Service: General;  Laterality: Right;   LEG SURGERY Right    lower, for fracture age 6   VASECTOMY     VASECTOMY REVERSAL      Family History  Problem Relation Age of Onset   Healthy Mother        Living   Hypertension Father        Living   Non-Hodgkin's lymphoma Father    Diabetes Father    Hypertension Other        Paternal side   Heart attack Paternal Grandfather    Healthy Sister        x1   Healthy Daughter        x3    No Known Allergies  Current Outpatient Medications on File Prior to Visit  Medication Sig Dispense Refill   atorvastatin  (LIPITOR) 20 MG tablet Take 1 tablet (20 mg total) by mouth daily. 90 tablet 0   baclofen  (LIORESAL ) 20 MG tablet Take 1 tablet (20 mg total) by mouth 3 (three) times daily. 30 each 0   busPIRone  (BUSPAR ) 7.5 MG tablet Take 1 tablet (7.5 mg total) by mouth 2 (two) times daily. 60 tablet 2   dicyclomine  (BENTYL ) 20 MG tablet Take 1 tablet  (20 mg total) by mouth 3 (three) times daily as needed for spasms. 20 tablet 0   fenofibrate  160 MG tablet Take 1 tablet (160 mg total) by mouth daily. 90 tablet 1   meloxicam  (MOBIC ) 15 MG tablet Take 1 tablet (15 mg total) by mouth daily as needed  for pain. (Patient not taking: Reported on 12/29/2022) 30 tablet 0   ondansetron  (ZOFRAN -ODT) 4 MG disintegrating tablet Take 1 tablet (4 mg total) by mouth every 8 (eight) hours as needed. 20 tablet 0   pantoprazole  (PROTONIX ) 40 MG tablet Take 1 tablet (40 mg total) by mouth daily. 20 tablet 0   traZODone  (DESYREL ) 50 MG tablet Take 1 tablet (50 mg total) by mouth at bedtime. 90 tablet 0   venlafaxine  XR (EFFEXOR  XR) 37.5 MG 24 hr capsule Take 1 capsule (37.5 mg total) by mouth daily with breakfast. 90 capsule 0   No current facility-administered medications on file prior to visit.    BP 137/70   Pulse 73   Temp 98.1 F (36.7 C)   Resp 18   Ht 5\' 9"  (1.753 m)   Wt 219 lb 6.4 oz (99.5 kg)   SpO2 96%   BMI 32.40 kg/m           Objective:   Physical Exam  General Mental Status- Alert. General Appearance- Not in acute distress.   Skin General: Color- Normal Color. Moisture- Normal Moisture.  Neck Carotid Arteries- Normal color. Moisture- Normal Moisture. No carotid bruits. No JVD.  Chest and Lung Exam Auscultation: Breath Sounds:-CTA  Cardiovascular Auscultation:Rythm- RRR Murmurs & Other Heart Sounds:Auscultation of the heart reveals- No Murmurs.  Abdomen Inspection:-Inspeection Normal. Palpation/Percussion:Note:No mass. Palpation and Percussion of the abdomen reveal- Non Tender, Non Distended + BS, no rebound or guarding.   Neurologic Cranial Nerve exam:- CN III-XII intact(No nystagmus), symmetric smile. Strength:- 5/5 equal and symmetric strength both upper and lower extremities.   Lower ext- no pedal edema, calf symmetric, negative homans signs.    Assessment & Plan:   Patient Instructions  For you wellness exam  today I have ordered cbc, cmp and lipid panel.  Vaccine declined today.  For fatigue will get tsh, b12 and vit d.  B12 deficiency- get b12 level.  Vit d def- get vit d level.  For snoring and sleep apnea contact dentist and pulmonlologist regarding your mouth guard measurements. You may need to see them again.  For anxiety, depression and insomnia continue buspar , effexor  and trazadone. Limited rx zanax rx provided.  Recommend exercise and healthy diet.  We will let you know lab results as they come in.  Follow up date appointment will be determined after lab review.        Sylvia Everts, PA-C   [id number] charge as addressed anxiety, depression, insomnia, snoring , urinary hesitancy and low b12/D vitamins.

## 2024-04-02 NOTE — Patient Instructions (Addendum)
 For you wellness exam today I have ordered cbc, cmp and lipid panel.  Vaccine declined today.  For fatigue will get tsh, b12 and vit d.  B12 deficiency- get b12 level.  Vit d def- get vit d level.  For snoring and sleep apnea contact dentist and pulmonlologist regarding your mouth guard measurements. You may need to see them again.  For anxiety, depression and insomnia continue buspar , effexor  and trazadone. Limited rx zanax rx provided.  Recommend exercise and healthy diet.  We will let you know lab results as they come in.  Follow up date appointment will be determined after lab review.    Preventive Care 60-73 Years Old, Male Preventive care refers to lifestyle choices and visits with your health care provider that can promote health and wellness. Preventive care visits are also called wellness exams. What can I expect for my preventive care visit? Counseling During your preventive care visit, your health care provider may ask about your: Medical history, including: Past medical problems. Family medical history. Current health, including: Emotional well-being. Home life and relationship well-being. Sexual activity. Lifestyle, including: Alcohol, nicotine or tobacco, and drug use. Access to firearms. Diet, exercise, and sleep habits. Safety issues such as seatbelt and bike helmet use. Sunscreen use. Work and work Astronomer. Physical exam Your health care provider will check your: Height and weight. These may be used to calculate your BMI (body mass index). BMI is a measurement that tells if you are at a healthy weight. Waist circumference. This measures the distance around your waistline. This measurement also tells if you are at a healthy weight and may help predict your risk of certain diseases, such as type 2 diabetes and high blood pressure. Heart rate and blood pressure. Body temperature. Skin for abnormal spots. What immunizations do I need?  Vaccines are  usually given at various ages, according to a schedule. Your health care provider will recommend vaccines for you based on your age, medical history, and lifestyle or other factors, such as travel or where you work. What tests do I need? Screening Your health care provider may recommend screening tests for certain conditions. This may include: Lipid and cholesterol levels. Diabetes screening. This is done by checking your blood sugar (glucose) after you have not eaten for a while (fasting). Hepatitis B test. Hepatitis C test. HIV (human immunodeficiency virus) test. STI (sexually transmitted infection) testing, if you are at risk. Lung cancer screening. Prostate cancer screening. Colorectal cancer screening. Talk with your health care provider about your test results, treatment options, and if necessary, the need for more tests. Follow these instructions at home: Eating and drinking  Eat a diet that includes fresh fruits and vegetables, whole grains, lean protein, and low-fat dairy products. Take vitamin and mineral supplements as recommended by your health care provider. Do not drink alcohol if your health care provider tells you not to drink. If you drink alcohol: Limit how much you have to 0-2 drinks a day. Know how much alcohol is in your drink. In the [place] one drink equals one 12 oz bottle of beer (355 mL), one 5 oz glass of wine (148 mL), or one 1 oz glass of hard liquor (44 mL). Lifestyle Brush your teeth every morning and night with fluoride toothpaste. Floss one time each day. Exercise for at least 30 minutes 5 or more days each week. Do not use any products that contain nicotine or tobacco. These products include cigarettes, chewing tobacco, and vaping devices, such as e-cigarettes.  If you need help quitting, ask your health care provider. Do not use drugs. If you are sexually active, practice safe sex. Use a condom or other form of protection to prevent STIs. Take aspirin  only as told by your health care provider. Make sure that you understand how much to take and what form to take. Work with your health care provider to find out whether it is safe and beneficial for you to take aspirin daily. Find healthy ways to manage stress, such as: Meditation, yoga, or listening to music. Journaling. Talking to a trusted person. Spending time with friends and family. Minimize exposure to UV radiation to reduce your risk of skin cancer. Safety Always wear your seat belt while driving or riding in a vehicle. Do not drive: If you have been drinking alcohol. Do not ride with someone who has been drinking. When you are tired or distracted. While texting. If you have been using any mind-altering substances or drugs. Wear a helmet and other protective equipment during sports activities. If you have firearms in your house, make sure you follow all gun safety procedures. What's next? Go to your health care provider once a year for an annual wellness visit. Ask your health care provider how often you should have your eyes and teeth checked. Stay up to date on all vaccines. This information is not intended to replace advice given to you by your health care provider. Make sure you discuss any questions you have with your health care provider. Document Revised: 04/08/2021 Document Reviewed: 04/08/2021 Elsevier Patient Education  2024 [employer].

## 2024-04-06 ENCOUNTER — Other Ambulatory Visit (HOSPITAL_COMMUNITY): Payer: Self-pay

## 2024-04-06 ENCOUNTER — Ambulatory Visit: Payer: Self-pay | Admitting: Medical

## 2024-04-06 MED ORDER — VITAMIN D (ERGOCALCIFEROL) 1.25 MG (50000 UNIT) PO CAPS
50000.0000 [IU] | ORAL_CAPSULE | ORAL | 0 refills | Status: AC
  Filled 2024-04-06: qty 8, 56d supply, fill #0

## 2024-04-06 NOTE — Addendum Note (Signed)
 Addended by: Serafina Damme on: 04/06/2024 04:52 PM   Modules accepted: Orders

## 2024-04-06 NOTE — Addendum Note (Signed)
 Addended by: Serafina Damme on: 04/06/2024 04:55 PM   Modules accepted: Orders

## 2024-04-09 ENCOUNTER — Other Ambulatory Visit (HOSPITAL_COMMUNITY): Payer: Self-pay

## 2024-04-09 ENCOUNTER — Other Ambulatory Visit (INDEPENDENT_AMBULATORY_CARE_PROVIDER_SITE_OTHER)

## 2024-04-09 DIAGNOSIS — R739 Hyperglycemia, unspecified: Secondary | ICD-10-CM

## 2024-04-09 LAB — HEMOGLOBIN A1C: Hgb A1c MFr Bld: 5.8 % (ref 4.6–6.5)

## 2024-04-23 ENCOUNTER — Other Ambulatory Visit: Payer: Self-pay | Admitting: Medical

## 2024-04-23 DIAGNOSIS — F5101 Primary insomnia: Secondary | ICD-10-CM

## 2024-04-24 ENCOUNTER — Other Ambulatory Visit: Payer: Self-pay

## 2024-04-24 ENCOUNTER — Other Ambulatory Visit (HOSPITAL_COMMUNITY): Payer: Self-pay

## 2024-04-24 MED ORDER — TRAZODONE HCL 50 MG PO TABS
50.0000 mg | ORAL_TABLET | Freq: Every day | ORAL | 1 refills | Status: DC
  Filled 2024-04-24: qty 90, 90d supply, fill #0
  Filled 2024-07-26: qty 90, 90d supply, fill #1

## 2024-05-08 ENCOUNTER — Other Ambulatory Visit: Payer: Self-pay | Admitting: Medical

## 2024-05-09 ENCOUNTER — Other Ambulatory Visit (HOSPITAL_COMMUNITY): Payer: Self-pay

## 2024-05-09 ENCOUNTER — Other Ambulatory Visit: Payer: Self-pay

## 2024-05-09 MED ORDER — BUSPIRONE HCL 7.5 MG PO TABS
7.5000 mg | ORAL_TABLET | Freq: Two times a day (BID) | ORAL | 2 refills | Status: DC
  Filled 2024-05-09: qty 60, 30d supply, fill #0
  Filled 2024-06-10: qty 60, 30d supply, fill #1
  Filled 2024-07-20: qty 60, 30d supply, fill #2

## 2024-05-10 ENCOUNTER — Other Ambulatory Visit (HOSPITAL_COMMUNITY): Payer: Self-pay

## 2024-05-17 ENCOUNTER — Ambulatory Visit: Payer: Self-pay

## 2024-05-17 NOTE — Telephone Encounter (Signed)
 FYI Only or Action Required?: FYI only for provider.  Patient was last seen in primary care on 04/02/2024 by Saguier, Edward, PA-C.  Called Nurse Triage reporting Palpitations.  Symptoms began several days ago.  Interventions attempted: Nothing.  Symptoms are: completely resolved.  Triage Disposition: See Physician Within 24 Hours (overriding Home Care)  Patient/caregiver understands and will follow disposition?:   Reason for Disposition  [1] Skipped or extra beat(s) AND [2] occurs < 4 times / minute  Answer Assessment - Initial Assessment Questions 911/ED precautions reviewed, pt verbalized understanding.   1. DESCRIPTION: Please describe your heart rate or heartbeat that you are having (e.g., fast/slow, regular/irregular, skipped or extra beats, palpitations)     Reports a heart fluttering sensation on 05/12/24 that lasted several seconds and resolved. States mild SOB with exertion since. States not currently experiencing this sensation.  2. ONSET: When did it start? (e.g., minutes, hours, days)      05/12/24  3. DURATION: How long does it last (e.g., seconds, minutes, hours)     Several seconds, then resolved  4. PATTERN Does it come and go, or has it been constant since it started?  Does it get worse with exertion?   Are you feeling it now?     One occurrence, states may have happened several years ago as well  9. CARDIAC HISTORY: Do you have any history of heart disease? (e.g., heart attack, angina, bypass surgery, angioplasty, arrhythmia)      Denies  10. OTHER SYMPTOMS: Do you have any other symptoms? (e.g., dizziness, chest pain, sweating, difficulty breathing)       Mild SOB with exertion and dull side/rib pain x 1 year  Protocols used: Heart Rate and Heartbeat Questions-A-AH Copied from CRM [id number]. Topic: Clinical - Red Word Triage >> May 17, 2024  1:00 PM Abigail D wrote: Red Word that prompted transfer to Nurse Triage: Patient returning call  from clinic to triage per appt notes: lvm 2 triage// heart flutters and shortness of breath sometimes. Dull pain in upper left and right sides. Experiencing heartburn pains more often (hopefully its heartburn and nothing else)  ----------------------------------------------------------------------- From previous Reason for Contact - Scheduling: Patient/patient representative is calling to schedule an appointment. Refer to attachments for appointment information.

## 2024-05-18 ENCOUNTER — Ambulatory Visit: Admitting: Family Medicine

## 2024-05-18 ENCOUNTER — Other Ambulatory Visit (HOSPITAL_COMMUNITY): Payer: Self-pay

## 2024-05-18 ENCOUNTER — Other Ambulatory Visit: Payer: Self-pay

## 2024-05-18 ENCOUNTER — Encounter: Payer: Self-pay | Admitting: Family Medicine

## 2024-05-18 ENCOUNTER — Ambulatory Visit: Attending: Family Medicine

## 2024-05-18 ENCOUNTER — Ambulatory Visit: Payer: Self-pay | Admitting: Family Medicine

## 2024-05-18 VITALS — BP 122/80 | HR 63 | Temp 98.0°F | Resp 16 | Ht 69.0 in | Wt 214.0 lb

## 2024-05-18 DIAGNOSIS — R002 Palpitations: Secondary | ICD-10-CM

## 2024-05-18 DIAGNOSIS — G4733 Obstructive sleep apnea (adult) (pediatric): Secondary | ICD-10-CM

## 2024-05-18 DIAGNOSIS — F32A Depression, unspecified: Secondary | ICD-10-CM | POA: Diagnosis not present

## 2024-05-18 DIAGNOSIS — F419 Anxiety disorder, unspecified: Secondary | ICD-10-CM

## 2024-05-18 LAB — COMPREHENSIVE METABOLIC PANEL WITH GFR
ALT: 28 U/L (ref 0–53)
AST: 16 U/L (ref 0–37)
Albumin: 4.7 g/dL (ref 3.5–5.2)
Alkaline Phosphatase: 58 U/L (ref 39–117)
BUN: 24 mg/dL — ABNORMAL HIGH (ref 6–23)
CO2: 27 meq/L (ref 19–32)
Calcium: 9.5 mg/dL (ref 8.4–10.5)
Chloride: 105 meq/L (ref 96–112)
Creatinine, Ser: 1.13 mg/dL (ref 0.40–1.50)
GFR: 79.2 mL/min (ref >60.00)
Glucose, Bld: 122 mg/dL — ABNORMAL HIGH (ref 70–99)
Potassium: 4.1 meq/L (ref 3.5–5.1)
Sodium: 141 meq/L (ref 135–145)
Total Bilirubin: 0.8 mg/dL (ref 0.2–1.2)
Total Protein: 7.2 g/dL (ref 6.0–8.3)

## 2024-05-18 LAB — TSH: TSH: 0.84 u[IU]/mL (ref 0.35–5.50)

## 2024-05-18 LAB — CBC
HCT: 44.8 % (ref 39.0–52.0)
Hemoglobin: 15.3 g/dL (ref 13.0–17.0)
MCHC: 34.2 g/dL (ref 30.0–36.0)
MCV: 85.8 fl (ref 78.0–100.0)
Platelets: 187 K/uL (ref 150.0–400.0)
RBC: 5.22 Mil/uL (ref 4.22–5.81)
RDW: 12.9 % (ref 11.5–15.5)
WBC: 6 K/uL (ref 4.0–10.5)

## 2024-05-18 LAB — MAGNESIUM: Magnesium: 2.1 mg/dL (ref 1.5–2.5)

## 2024-05-18 LAB — T4, FREE: Free T4: 0.9 ng/dL (ref 0.60–1.60)

## 2024-05-18 MED ORDER — VENLAFAXINE HCL ER 75 MG PO CP24
75.0000 mg | ORAL_CAPSULE | Freq: Every day | ORAL | 2 refills | Status: DC
  Filled 2024-05-18: qty 30, 30d supply, fill #0
  Filled 2024-06-24: qty 30, 30d supply, fill #1
  Filled 2024-07-27 – 2024-07-30 (×2): qty 30, 30d supply, fill #2

## 2024-05-18 NOTE — Patient Instructions (Signed)
Give us 2-3 business days to get the results of your labs back.  ° °Stay hydrated. ° °If you do not hear anything about your referral in the next 1-2 weeks, call our office and ask for an update. ° °Let us know if you need anything. °

## 2024-05-18 NOTE — Progress Notes (Signed)
 Chief Complaint  Patient presents with   Irregular Heart Beat    Irregular Heart Beat    Jerry Lopez is a 43 y.o. male here for palpitations. Here w wife.   Length of issue: 6 days ago How long does it last: 5-10 seconds No episodes since then. Light headedness/passing out? No Chest pain/shortness of breath? No Hx of arrhythmia? No Medication changes/illicit substances? No Caffeine/alcohol intake? Yes but no changes +untreated OSA. High stress levels. Taking Effexor  XR 37.5 mg daily, BuSpar  7.5 mg twice daily.  He is not seeing a therapist.  His father recently passed away and he has had more responsibilities caring for his mother.  No homicidal or suicidal ideation.  No self-medication.  Past Medical History:  Diagnosis Date   Anxiety    Arthritis    Chest pain around 12-02-2014   GERD (gastroesophageal reflux disease)    Hypercholesteremia     BP 122/80 (BP Location: Left Arm, Patient Position: Sitting)   Pulse 63   Temp 98 F (36.7 C) (Oral)   Resp 16   Ht 5' 9 (1.753 m)   Wt 214 lb (97.1 kg)   SpO2 99%   BMI 31.60 kg/m  Gen: Awake, alert, appearing stated age Eyes: PERRLA Mouth: MMM Heart: RRR, no bruits, no LE edema Lungs: CTAB, no accessory muscle use Neuro: No cerebellar signs MSK: No muscle group atrophy or asymmetry Psych: Age appropriate judgment and insight, mood/affect WNL  Palpitations - Plan: TSH, T4, free, Comprehensive metabolic panel with GFR, CBC, Magnesium   OSA (obstructive sleep apnea) - Plan: Ambulatory referral to Pulmonology  Anxiety and depression - Plan: venlafaxine  XR (EFFEXOR  XR) 75 MG 24 hr capsule  Discussed EKG but decided to hold off given unlikelihood of catching anything with RRR on exam. Stay hydrated. Could be related to stress. Ck labs. If neg, will ck Zio patch for 2 weeks.  Needs to get back in w pulm for OSA. GAD/depression: Chronic, uncontrolled. Increase Effexor  from 37.5 mg/d to 75 mg/d. F/u w reg PCP in 1 mo.  The  patient voiced understanding and agreement to the plan.  Mabel [place] Wadlow 9:14 AM 05/18/24

## 2024-05-21 NOTE — Progress Notes (Unsigned)
 EP to read.

## 2024-05-23 ENCOUNTER — Ambulatory Visit: Admitting: Medical

## 2024-05-24 ENCOUNTER — Ambulatory Visit (HOSPITAL_BASED_OUTPATIENT_CLINIC_OR_DEPARTMENT_OTHER): Admitting: Nurse Practitioner

## 2024-05-24 ENCOUNTER — Encounter (HOSPITAL_BASED_OUTPATIENT_CLINIC_OR_DEPARTMENT_OTHER): Payer: Self-pay | Admitting: Nurse Practitioner

## 2024-05-24 VITALS — BP 142/93 | HR 88 | Ht 69.0 in | Wt 214.0 lb

## 2024-05-24 DIAGNOSIS — G4733 Obstructive sleep apnea (adult) (pediatric): Secondary | ICD-10-CM | POA: Diagnosis not present

## 2024-05-24 DIAGNOSIS — E66811 Obesity, class 1: Secondary | ICD-10-CM

## 2024-05-24 DIAGNOSIS — R0683 Snoring: Secondary | ICD-10-CM

## 2024-05-24 DIAGNOSIS — R002 Palpitations: Secondary | ICD-10-CM

## 2024-05-24 DIAGNOSIS — F17210 Nicotine dependence, cigarettes, uncomplicated: Secondary | ICD-10-CM

## 2024-05-24 DIAGNOSIS — Z6831 Body mass index (BMI) 31.0-31.9, adult: Secondary | ICD-10-CM

## 2024-05-24 NOTE — Assessment & Plan Note (Signed)
 BMI 31. Healthy weight management encouraged.

## 2024-05-24 NOTE — Progress Notes (Signed)
 @Patient  ID: Jerry Lopez, male    DOB: [birth year], 43 y.o.   MRN: [id number]  Chief Complaint  Patient presents with   Follow-up    OSA     Referring provider: Frann Mabel Mt*  HPI: 43 year old male, active smoker followed for OSA. He was last seen in office 10/07/2022. Past medical history significant for GERD, obesity, insomnia, ILD, anxiety, depression.  TEST/EVENTS:  09/21/2022 HST: AHI 13.7/h, SpO2 low 83%  10/07/2022: OV with Parrett,NP. Mild OSA with AHI 13.7/h. Reviewed sleep study. Would like to proceed with oral appliance. Referral to Dr. Micky with orthodontics.  05/24/2024: Today - follow up Pt presents today for sleep apnea follow up. Never received oral appliance due to cost. His wife is with him. They feel his symptoms have gradually worsened since his initial sleep study.  Morning headaches  Drowsy driving with longer distances  Very loud snoring Witnessed apneas  Wakes up feeling short winded and gasping at times No energy; always fatigued.  No sleep parasomnias/paralysis. No hx of narcolepsy or cataplexy.  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  Waking up at least twice a night Usually an hour after falling asleep, wakes up gasping No alcohol use. Has cut back on [place] Dews over the last few weeks.   New onset palpitations - being evaluated by cardiology with plans for a cardiac monitor.    No Known Allergies  Immunization History  Administered Date(s) Administered   Influenza,inj,Quad PF,6+ Mos 10/30/2016   Tdap 11/14/2013, 04/08/2019    Past Medical History:  Diagnosis Date   Anxiety    Arthritis    Chest pain around 12-02-2014   GERD (gastroesophageal reflux disease)    Hypercholesteremia     Tobacco History: Social History   Tobacco Use  Smoking Status Some Days   Current packs/day: 0.00   Average packs/day: 0.5 packs/day  for 5.0 years (2.5 ttl pk-yrs)   Types: Cigarettes   Start date: 12/26/2010   Last attempt to quit: 12/26/2015   Years since quitting: 8.4  Smokeless Tobacco Never  Tobacco Comments   Stopped smoking again on 10/04/22 wearing patches 14 mg, hfb   Ready to quit: Not Answered Counseling given: Not Answered Tobacco comments: Stopped smoking again on 10/04/22 wearing patches 14 mg, hfb   Outpatient Medications Prior to Visit  Medication Sig Dispense Refill   ALPRAZolam  (XANAX ) 0.5 MG tablet Take 1 tablet (0.5 mg total) by mouth daily as needed for severe anxiety or insomnia (max of 1 tablet per day). 16 tablet 0   atorvastatin  (LIPITOR) 20 MG tablet Take 1 tablet (20 mg total) by mouth daily. 90 tablet 0   busPIRone  (BUSPAR ) 7.5 MG tablet Take 1 tablet (7.5 mg total) by mouth 2 (two) times daily. 60 tablet 2   fenofibrate  160 MG tablet Take 1 tablet (160 mg total) by mouth daily. 90 tablet 1   ondansetron  (ZOFRAN -ODT) 4 MG disintegrating tablet Take 1 tablet (4 mg total) by mouth every 8 (eight) hours as needed. 20 tablet 0   traZODone  (DESYREL ) 50 MG tablet Take 1 tablet (50 mg total) by mouth at bedtime. 90 tablet 1   venlafaxine  XR (EFFEXOR  XR) 75 MG 24 hr capsule Take 1 capsule (75 mg total) by mouth daily with breakfast. 30 capsule 2   Vitamin D , Ergocalciferol , (DRISDOL ) 1.25 MG (50000 UNIT) CAPS capsule Take 1  capsule (50,000 Units total) by mouth every 7 (seven) days. 8 capsule 0   dicyclomine  (BENTYL ) 20 MG tablet Take 1 tablet (20 mg total) by mouth 3 (three) times daily as needed for spasms. 20 tablet 0   meloxicam  (MOBIC ) 15 MG tablet Take 1 tablet (15 mg total) by mouth daily as needed for pain. 30 tablet 0   baclofen  (LIORESAL ) 20 MG tablet Take 1 tablet (20 mg total) by mouth 3 (three) times daily. (Patient not taking: Reported on 05/24/2024) 30 each 0   pantoprazole  (PROTONIX ) 40 MG tablet Take 1 tablet (40 mg total) by mouth daily. 20 tablet 0   No facility-administered  medications prior to visit.     Review of Systems:   Constitutional: No weight loss or gain, night sweats, fevers, chills, or lassitude. +fatigue  HEENT: +morning headaches CV:  +palpitations, PND. No chest pain, orthopnea Resp: +snoring, witnessed apneas GU: No nocturia  Neuro: No memory impairment  Psych: No increased depression or anxiety. Mood stable. +sleep disturbance    Physical Exam:  BP (!) 142/93 (BP Location: Left Arm, Patient Position: Sitting)   Pulse 88   Ht 5' 9 (1.753 m)   Wt 214 lb (97.1 kg)   SpO2 97%   BMI 31.60 kg/m   GEN: Pleasant, interactive, well-appearing; in no acute distress HEENT:  Normocephalic and atraumatic. PERRLA. Sclera white. Nasal turbinates pink, moist and patent bilaterally. No rhinorrhea present. Oropharynx pink and moist, without exudate or edema. No lesions, ulcerations, or postnasal drip. Mallampati III; enlarged tonsils NECK:  Supple w/ fair ROM. No JVD present. Thyroid  symmetrical with no goiter or nodules palpated. No lymphadenopathy.   CV: RRR, no m/r/g, no peripheral edema. Pulses intact, +2 bilaterally. No cyanosis, pallor or clubbing. PULMONARY:  Unlabored, regular breathing. Clear bilaterally A&P w/o wheezes/rales/rhonchi.  GI: BS present and normoactive. Soft, non-tender to palpation. No organomegaly or masses detected.  MSK: No erythema, warmth or tenderness. Cap refil <2 sec all extrem.  Neuro: A/Ox3. No focal deficits noted.   Skin: Warm, no lesions or rashe Psych: Normal affect and behavior. Judgement and thought content appropriate.     Lab Results:  CBC    Component Value Date/Time   WBC 6.0 05/18/2024 0839   RBC 5.22 05/18/2024 0839   HGB 15.3 05/18/2024 0839   HCT 44.8 05/18/2024 0839   PLT 187.0 05/18/2024 0839   MCV 85.8 05/18/2024 0839   MCH 29.8 06/02/2023 2022   MCHC 34.2 05/18/2024 0839   RDW 12.9 05/18/2024 0839   LYMPHSABS 2.0 04/02/2024 0947   MONOABS 0.4 04/02/2024 0947   EOSABS 0.2 04/02/2024  0947   BASOSABS 0.0 04/02/2024 0947    BMET    Component Value Date/Time   NA 141 05/18/2024 0839   K 4.1 05/18/2024 0839   CL 105 05/18/2024 0839   CO2 27 05/18/2024 0839   GLUCOSE 122 (H) 05/18/2024 0839   BUN 24 (H) 05/18/2024 0839   CREATININE 1.13 05/18/2024 0839   CREATININE 1.20 05/23/2014 0815   CALCIUM  9.5 05/18/2024 0839   GFRNONAA >60 06/02/2023 2022   GFRAA >90 12/31/2014 1455    BNP No results found for: BNP   Imaging:  No results found.  Administration History     None           No data to display          No results found for: NITRICOXIDE      Assessment & Plan:   OSA (obstructive sleep  apnea) Hx of mild OSA. Oral appliance not cost effective. Would like to consider CPAP therapy for management. Progressive worsening of symptoms. He has a history of mild OSA. He has ongoing snoring, excessive daytime sleepiness, nocturnal apneic events, morning headaches, restless sleep. BMI 31. He will need a repeat sleep study for further evaluation.    - discussed how weight can impact sleep and risk for sleep disordered breathing - discussed options to assist with weight loss: combination of diet modification, cardiovascular and strength training exercises   - had an extensive discussion regarding the adverse health consequences related to untreated sleep disordered breathing - specifically discussed the risks for hypertension, coronary artery disease, cardiac dysrhythmias, cerebrovascular disease, and diabetes - lifestyle modification discussed   - discussed how sleep disruption can increase risk of accidents, particularly when driving - safe driving practices were discussed  Patient Instructions  Given your symptoms and history, I am concerned that you still have sleep disordered breathing with sleep apnea. You will need a repeat sleep study for further evaluation. Someone will contact you to schedule this.   We discussed how untreated sleep  apnea puts an individual at risk for cardiac arrhthymias, pulm HTN, DM, stroke and increases their risk for daytime accidents. We also briefly reviewed treatment options including weight loss, side sleeping position, oral appliance, CPAP therapy or referral to ENT for possible surgical options (not an option for mild sleep apnea or those who have not tried CPAP; can discuss further once we get your results)  Use caution when driving and pull over if you become sleepy.  Follow up in 6 weeks with Katie Krisinda Giovanni,NP to go over sleep study results, or sooner, if needed. Friday PM virtual clinic preferred        Palpitations Follow up with cardiology as scheduled. Need to rule out OSA as contributing factor. Minimal cardiovascular risks associated with mild OSA.  Obesity (BMI 30.0-34.9) BMI 31. Healthy weight management encouraged.   Advised if symptoms do not improve or worsen, to please contact office for sooner follow up or seek emergency care.   I spent 35 minutes of dedicated to the care of this patient on the date of this encounter to include pre-visit review of records, face-to-face time with the patient discussing conditions above, post visit ordering of testing, clinical documentation with the electronic health record, making appropriate referrals as documented, and communicating necessary findings to members of the patients care team.  Comer LULLA Rouleau, NP 05/24/2024  Pt aware and understands NP's role.

## 2024-05-24 NOTE — Assessment & Plan Note (Signed)
 Follow up with cardiology as scheduled. Need to rule out OSA as contributing factor. Minimal cardiovascular risks associated with mild OSA.

## 2024-05-24 NOTE — Patient Instructions (Addendum)
 Given your symptoms and history, I am concerned that you still have sleep disordered breathing with sleep apnea. You will need a repeat sleep study for further evaluation. Someone will contact you to schedule this.   We discussed how untreated sleep apnea puts an individual at risk for cardiac arrhthymias, pulm HTN, DM, stroke and increases their risk for daytime accidents. We also briefly reviewed treatment options including weight loss, side sleeping position, oral appliance, CPAP therapy or referral to ENT for possible surgical options (not an option for mild sleep apnea or those who have not tried CPAP; can discuss further once we get your results)  Use caution when driving and pull over if you become sleepy.  Follow up in 6 weeks with Katie Oretta Berkland,NP to go over sleep study results, or sooner, if needed. Friday PM virtual clinic preferred

## 2024-05-24 NOTE — Assessment & Plan Note (Signed)
 Hx of mild OSA. Oral appliance not cost effective. Would like to consider CPAP therapy for management. Progressive worsening of symptoms. He has a history of mild OSA. He has ongoing snoring, excessive daytime sleepiness, nocturnal apneic events, morning headaches, restless sleep. BMI 31. He will need a repeat sleep study for further evaluation.    - discussed how weight can impact sleep and risk for sleep disordered breathing - discussed options to assist with weight loss: combination of diet modification, cardiovascular and strength training exercises   - had an extensive discussion regarding the adverse health consequences related to untreated sleep disordered breathing - specifically discussed the risks for hypertension, coronary artery disease, cardiac dysrhythmias, cerebrovascular disease, and diabetes - lifestyle modification discussed   - discussed how sleep disruption can increase risk of accidents, particularly when driving - safe driving practices were discussed  Patient Instructions  Given your symptoms and history, I am concerned that you still have sleep disordered breathing with sleep apnea. You will need a repeat sleep study for further evaluation. Someone will contact you to schedule this.   We discussed how untreated sleep apnea puts an individual at risk for cardiac arrhthymias, pulm HTN, DM, stroke and increases their risk for daytime accidents. We also briefly reviewed treatment options including weight loss, side sleeping position, oral appliance, CPAP therapy or referral to ENT for possible surgical options (not an option for mild sleep apnea or those who have not tried CPAP; can discuss further once we get your results)  Use caution when driving and pull over if you become sleepy.  Follow up in 6 weeks with Katie Sadaf Przybysz,NP to go over sleep study results, or sooner, if needed. Friday PM virtual clinic preferred

## 2024-06-11 ENCOUNTER — Other Ambulatory Visit (HOSPITAL_COMMUNITY): Payer: Self-pay

## 2024-06-11 ENCOUNTER — Encounter

## 2024-06-11 DIAGNOSIS — G4733 Obstructive sleep apnea (adult) (pediatric): Secondary | ICD-10-CM

## 2024-06-11 DIAGNOSIS — E66811 Obesity, class 1: Secondary | ICD-10-CM

## 2024-06-11 DIAGNOSIS — R0683 Snoring: Secondary | ICD-10-CM

## 2024-06-13 DIAGNOSIS — R002 Palpitations: Secondary | ICD-10-CM | POA: Diagnosis not present

## 2024-06-14 DIAGNOSIS — R002 Palpitations: Secondary | ICD-10-CM | POA: Diagnosis not present

## 2024-06-15 ENCOUNTER — Ambulatory Visit: Payer: Self-pay | Admitting: Family Medicine

## 2024-06-24 ENCOUNTER — Other Ambulatory Visit (HOSPITAL_COMMUNITY): Payer: Self-pay

## 2024-06-26 ENCOUNTER — Other Ambulatory Visit (HOSPITAL_COMMUNITY): Payer: Self-pay

## 2024-07-20 ENCOUNTER — Other Ambulatory Visit: Payer: Self-pay

## 2024-07-20 ENCOUNTER — Ambulatory Visit: Admitting: Nurse Practitioner

## 2024-07-20 DIAGNOSIS — G4733 Obstructive sleep apnea (adult) (pediatric): Secondary | ICD-10-CM

## 2024-07-20 DIAGNOSIS — R0683 Snoring: Secondary | ICD-10-CM

## 2024-07-21 ENCOUNTER — Other Ambulatory Visit (HOSPITAL_COMMUNITY): Payer: Self-pay

## 2024-07-27 ENCOUNTER — Other Ambulatory Visit (HOSPITAL_COMMUNITY): Payer: Self-pay

## 2024-07-28 ENCOUNTER — Other Ambulatory Visit (HOSPITAL_COMMUNITY): Payer: Self-pay

## 2024-07-30 ENCOUNTER — Other Ambulatory Visit (HOSPITAL_COMMUNITY): Payer: Self-pay

## 2024-07-30 ENCOUNTER — Other Ambulatory Visit: Payer: Self-pay

## 2024-08-20 ENCOUNTER — Other Ambulatory Visit: Payer: Self-pay | Admitting: Medical

## 2024-08-20 DIAGNOSIS — E785 Hyperlipidemia, unspecified: Secondary | ICD-10-CM

## 2024-08-21 ENCOUNTER — Other Ambulatory Visit (HOSPITAL_COMMUNITY): Payer: Self-pay

## 2024-08-21 ENCOUNTER — Other Ambulatory Visit: Payer: Self-pay

## 2024-08-21 MED ORDER — ATORVASTATIN CALCIUM 20 MG PO TABS
20.0000 mg | ORAL_TABLET | Freq: Every day | ORAL | 0 refills | Status: AC
  Filled 2024-08-21: qty 90, 90d supply, fill #0

## 2024-09-01 ENCOUNTER — Other Ambulatory Visit: Payer: Self-pay | Admitting: Family Medicine

## 2024-09-01 ENCOUNTER — Other Ambulatory Visit: Payer: Self-pay | Admitting: Medical

## 2024-09-01 DIAGNOSIS — F32A Depression, unspecified: Secondary | ICD-10-CM

## 2024-09-03 ENCOUNTER — Other Ambulatory Visit: Payer: Self-pay

## 2024-09-03 ENCOUNTER — Other Ambulatory Visit (HOSPITAL_COMMUNITY): Payer: Self-pay

## 2024-09-03 MED ORDER — VENLAFAXINE HCL ER 75 MG PO CP24
75.0000 mg | ORAL_CAPSULE | Freq: Every day | ORAL | 2 refills | Status: AC
  Filled 2024-09-03: qty 30, 30d supply, fill #0
  Filled 2024-10-01: qty 30, 30d supply, fill #1
  Filled 2024-11-12: qty 30, 30d supply, fill #2

## 2024-09-03 MED ORDER — BUSPIRONE HCL 7.5 MG PO TABS
7.5000 mg | ORAL_TABLET | Freq: Two times a day (BID) | ORAL | 2 refills | Status: AC
  Filled 2024-09-03: qty 60, 30d supply, fill #0
  Filled 2024-10-21: qty 60, 30d supply, fill #1

## 2024-10-01 ENCOUNTER — Other Ambulatory Visit (HOSPITAL_COMMUNITY): Payer: Self-pay

## 2024-10-02 ENCOUNTER — Other Ambulatory Visit (HOSPITAL_COMMUNITY): Payer: Self-pay

## 2024-10-12 ENCOUNTER — Ambulatory Visit: Payer: Self-pay

## 2024-10-12 NOTE — Telephone Encounter (Signed)
Spoke with patient and he will go to urgent care.

## 2024-10-12 NOTE — Telephone Encounter (Signed)
 FYI Only or Action Required?: FYI only for provider: referred to UC.  Patient was last seen in primary care on 05/18/2024 by Frann Mabel Mt, DO.  Called Nurse Triage reporting Abdominal Pain.  Symptoms began 2 days ago.  Interventions attempted: Nothing.  Symptoms are: unchanged.  Triage Disposition: See HCP Within 4 Hours (Or PCP Triage)  Patient/caregiver understands and will follow disposition?:   Reason for Triage: upper abdominal pain, recently got over flu.  [phone]   Reason for Disposition  [1] MILD-MODERATE pain AND [2] constant AND [3] present > 2 hours  Answer Assessment - Initial Assessment Questions 1. LOCATION: Where does it hurt?      Upper abdomen below ribs 2. RADIATION: Does the pain shoot anywhere else? (e.g., chest, back)     no 3. ONSET: When did the pain begin? (e.g., minutes, hours or days ago)      2 days ago 4. SUDDEN: Gradual or sudden onset?     sudden 5. PATTERN Does the pain come and go, or is it constant?     Constant 2/10, varies in intensity up to 8/10 6. SEVERITY: How bad is the pain?  (e.g., Scale 1-10; mild, moderate, or severe)     2/10 at this time 7. RECURRENT SYMPTOM: Have you ever had this type of stomach pain before? If Yes, ask: When was the last time? and What happened that time?      no 8. AGGRAVATING FACTORS: Does anything seem to cause this pain? (e.g., foods, stress, alcohol)     no 9. CARDIAC SYMPTOMS: Do you have any of the following symptoms: chest pain, difficulty breathing, sweating, nausea?     no 10. OTHER SYMPTOMS: Do you have any other symptoms? (e.g., back pain, diarrhea, fever, urination pain, vomiting)       Diarrhea/ fever last weekend, resolved  Protocols used: Abdominal Pain - Upper-A-AH

## 2024-10-21 ENCOUNTER — Other Ambulatory Visit: Payer: Self-pay | Admitting: Medical

## 2024-10-21 ENCOUNTER — Other Ambulatory Visit (HOSPITAL_COMMUNITY): Payer: Self-pay

## 2024-10-21 DIAGNOSIS — F5101 Primary insomnia: Secondary | ICD-10-CM

## 2024-10-22 ENCOUNTER — Other Ambulatory Visit (HOSPITAL_COMMUNITY): Payer: Self-pay

## 2024-10-22 ENCOUNTER — Other Ambulatory Visit: Payer: Self-pay

## 2024-10-22 MED ORDER — TRAZODONE HCL 50 MG PO TABS
50.0000 mg | ORAL_TABLET | Freq: Every day | ORAL | 0 refills | Status: DC
  Filled 2024-10-22: qty 30, 30d supply, fill #0

## 2024-10-23 ENCOUNTER — Other Ambulatory Visit: Payer: Self-pay | Admitting: Medical

## 2024-10-23 DIAGNOSIS — F5101 Primary insomnia: Secondary | ICD-10-CM

## 2024-10-24 ENCOUNTER — Other Ambulatory Visit (HOSPITAL_COMMUNITY): Payer: Self-pay

## 2024-11-12 ENCOUNTER — Other Ambulatory Visit (HOSPITAL_COMMUNITY): Payer: Self-pay

## 2024-11-20 ENCOUNTER — Other Ambulatory Visit: Payer: Self-pay | Admitting: Medical

## 2024-11-20 DIAGNOSIS — F5101 Primary insomnia: Secondary | ICD-10-CM

## 2024-11-21 ENCOUNTER — Other Ambulatory Visit (HOSPITAL_COMMUNITY): Payer: Self-pay

## 2024-11-21 MED ORDER — TRAZODONE HCL 50 MG PO TABS
50.0000 mg | ORAL_TABLET | Freq: Every day | ORAL | 0 refills | Status: AC
  Filled 2024-11-21: qty 30, 30d supply, fill #0

## 2024-11-30 ENCOUNTER — Other Ambulatory Visit (HOSPITAL_COMMUNITY): Payer: Self-pay
